# Patient Record
Sex: Female | Born: 1960
Health system: Southern US, Community
[De-identification: ages and names within clinical notes are randomized; demographics above are authoritative.]

## PROBLEM LIST (undated history)

## (undated) DIAGNOSIS — M069 Rheumatoid arthritis, unspecified: Secondary | ICD-10-CM

## (undated) DIAGNOSIS — F419 Anxiety disorder, unspecified: Secondary | ICD-10-CM

## (undated) DIAGNOSIS — J449 Chronic obstructive pulmonary disease, unspecified: Secondary | ICD-10-CM

## (undated) DIAGNOSIS — F329 Major depressive disorder, single episode, unspecified: Secondary | ICD-10-CM

## (undated) DIAGNOSIS — E1140 Type 2 diabetes mellitus with diabetic neuropathy, unspecified: Secondary | ICD-10-CM

## (undated) DIAGNOSIS — F32A Depression, unspecified: Secondary | ICD-10-CM

## (undated) DIAGNOSIS — M797 Fibromyalgia: Secondary | ICD-10-CM

## (undated) DIAGNOSIS — M1990 Unspecified osteoarthritis, unspecified site: Secondary | ICD-10-CM

## (undated) DIAGNOSIS — F1920 Other psychoactive substance dependence, uncomplicated: Secondary | ICD-10-CM

## (undated) DIAGNOSIS — R112 Nausea with vomiting, unspecified: Secondary | ICD-10-CM

## (undated) DIAGNOSIS — K08109 Complete loss of teeth, unspecified cause, unspecified class: Secondary | ICD-10-CM

## (undated) DIAGNOSIS — K5790 Diverticulosis of intestine, part unspecified, without perforation or abscess without bleeding: Secondary | ICD-10-CM

## (undated) DIAGNOSIS — Z973 Presence of spectacles and contact lenses: Secondary | ICD-10-CM

## (undated) DIAGNOSIS — Z9889 Other specified postprocedural states: Secondary | ICD-10-CM

## (undated) DIAGNOSIS — Z972 Presence of dental prosthetic device (complete) (partial): Secondary | ICD-10-CM

## (undated) DIAGNOSIS — K648 Other hemorrhoids: Secondary | ICD-10-CM

## (undated) DIAGNOSIS — K219 Gastro-esophageal reflux disease without esophagitis: Secondary | ICD-10-CM

## (undated) DIAGNOSIS — E785 Hyperlipidemia, unspecified: Secondary | ICD-10-CM

## (undated) HISTORY — DX: Gastro-esophageal reflux disease without esophagitis: K21.9

## (undated) HISTORY — DX: Unspecified osteoarthritis, unspecified site: M19.90

## (undated) HISTORY — DX: Fibromyalgia: M79.7

## (undated) HISTORY — DX: Major depressive disorder, single episode, unspecified: F32.9

## (undated) HISTORY — PX: TONSILLECTOMY: SUR1361

## (undated) HISTORY — DX: Depression, unspecified: F32.A

## (undated) HISTORY — DX: Type 2 diabetes mellitus with diabetic neuropathy, unspecified: E11.40

## (undated) HISTORY — PX: KNEE ARTHROSCOPY: SUR90

## (undated) HISTORY — PX: APPENDECTOMY: SHX54

## (undated) HISTORY — DX: Rheumatoid arthritis, unspecified: M06.9

## (undated) HISTORY — DX: Hyperlipidemia, unspecified: E78.5

## (undated) HISTORY — DX: Diverticulosis of intestine, part unspecified, without perforation or abscess without bleeding: K57.90

## (undated) HISTORY — PX: HERNIA REPAIR: SHX51

## (undated) HISTORY — DX: Other hemorrhoids: K64.8

## (undated) HISTORY — PX: KNEE ARTHROSCOPY: SHX127

## (undated) HISTORY — DX: Other psychoactive substance dependence, uncomplicated: F19.20

## (undated) HISTORY — DX: Anxiety disorder, unspecified: F41.9

---

## 1999-07-02 HISTORY — PX: OTHER SURGICAL HISTORY: SHX169

## 2000-07-01 HISTORY — PX: TUBAL LIGATION: SHX77

## 2007-07-02 HISTORY — PX: CATARACT EXTRACTION: SUR2

## 2008-01-30 HISTORY — PX: EYE SURGERY: SHX253

## 2009-07-01 HISTORY — PX: OTHER SURGICAL HISTORY: SHX169

## 2009-10-18 ENCOUNTER — Emergency Department (HOSPITAL_BASED_OUTPATIENT_CLINIC_OR_DEPARTMENT_OTHER): Admission: EM | Admit: 2009-10-18 | Discharge: 2009-10-18 | Payer: Self-pay | Admitting: Emergency Medicine

## 2009-10-23 ENCOUNTER — Emergency Department (HOSPITAL_COMMUNITY): Admission: EM | Admit: 2009-10-23 | Discharge: 2009-10-24 | Payer: Self-pay | Admitting: Emergency Medicine

## 2010-09-18 LAB — BASIC METABOLIC PANEL
CO2: 22 mEq/L (ref 19–32)
Calcium: 9.2 mg/dL (ref 8.4–10.5)
GFR calc Af Amer: >60 mL/min (ref >60)
GFR calc non Af Amer: >60 mL/min (ref >60)
Glucose, Bld: 289 mg/dL — ABNORMAL HIGH (ref 70–99)

## 2010-09-18 LAB — D-DIMER, QUANTITATIVE: D-Dimer, Quant: <0.22 ug/mL-FEU (ref 0.00–0.48)

## 2010-12-12 ENCOUNTER — Encounter: Payer: Medicaid Other | Attending: Family Medicine

## 2010-12-12 DIAGNOSIS — E119 Type 2 diabetes mellitus without complications: Secondary | ICD-10-CM | POA: Insufficient documentation

## 2010-12-12 DIAGNOSIS — Z713 Dietary counseling and surveillance: Secondary | ICD-10-CM | POA: Insufficient documentation

## 2011-04-12 ENCOUNTER — Other Ambulatory Visit (HOSPITAL_COMMUNITY): Payer: Self-pay | Admitting: Family Medicine

## 2011-04-12 DIAGNOSIS — Z1231 Encounter for screening mammogram for malignant neoplasm of breast: Secondary | ICD-10-CM

## 2011-04-16 ENCOUNTER — Emergency Department (HOSPITAL_COMMUNITY): Payer: Medicare Other

## 2011-04-16 ENCOUNTER — Emergency Department (HOSPITAL_COMMUNITY)
Admission: EM | Admit: 2011-04-16 | Discharge: 2011-04-16 | Disposition: A | Discharge status: Home / Self Care | Payer: Medicare Other | Attending: Emergency Medicine | Admitting: Emergency Medicine

## 2011-04-16 DIAGNOSIS — Z79899 Other long term (current) drug therapy: Secondary | ICD-10-CM | POA: Insufficient documentation

## 2011-04-16 DIAGNOSIS — R1032 Left lower quadrant pain: Secondary | ICD-10-CM | POA: Insufficient documentation

## 2011-04-16 DIAGNOSIS — E119 Type 2 diabetes mellitus without complications: Secondary | ICD-10-CM | POA: Insufficient documentation

## 2011-04-16 DIAGNOSIS — M129 Arthropathy, unspecified: Secondary | ICD-10-CM | POA: Insufficient documentation

## 2011-04-16 DIAGNOSIS — K625 Hemorrhage of anus and rectum: Secondary | ICD-10-CM | POA: Insufficient documentation

## 2011-04-16 DIAGNOSIS — K5730 Diverticulosis of large intestine without perforation or abscess without bleeding: Secondary | ICD-10-CM | POA: Insufficient documentation

## 2011-04-16 DIAGNOSIS — K5900 Constipation, unspecified: Secondary | ICD-10-CM | POA: Insufficient documentation

## 2011-04-16 DIAGNOSIS — Z794 Long term (current) use of insulin: Secondary | ICD-10-CM | POA: Insufficient documentation

## 2011-04-16 DIAGNOSIS — IMO0001 Reserved for inherently not codable concepts without codable children: Secondary | ICD-10-CM | POA: Insufficient documentation

## 2011-04-16 DIAGNOSIS — G589 Mononeuropathy, unspecified: Secondary | ICD-10-CM | POA: Insufficient documentation

## 2011-04-16 LAB — BASIC METABOLIC PANEL
BUN: 11 mg/dL (ref 6–23)
Calcium: 9.9 mg/dL (ref 8.4–10.5)
Glucose, Bld: 172 mg/dL — ABNORMAL HIGH (ref 70–99)

## 2011-04-16 LAB — URINALYSIS, ROUTINE W REFLEX MICROSCOPIC
Bilirubin Urine: NEGATIVE
Glucose, UA: 100 mg/dL — AB
Specific Gravity, Urine: 1.025 (ref 1.005–1.030)
Urobilinogen, UA: 0.2 mg/dL (ref 0.0–1.0)
pH: 5 (ref 5.0–8.0)

## 2011-04-16 LAB — DIFFERENTIAL
Basophils Absolute: 0 10*3/uL (ref 0.0–0.1)
Basophils Relative: 1 % (ref 0–1)
Monocytes Relative: 5 % (ref 3–12)

## 2011-04-16 LAB — CBC
HCT: 41.7 % (ref 36.0–46.0)
Hemoglobin: 14.1 g/dL (ref 12.0–15.0)
MCH: 29.8 pg (ref 26.0–34.0)
MCV: 88.2 fL (ref 78.0–100.0)
Platelets: 224 10*3/uL (ref 150–400)
RDW: 13.3 % (ref 11.5–15.5)
WBC: 8.7 10*3/uL (ref 4.0–10.5)

## 2011-04-16 LAB — OCCULT BLOOD, POC DEVICE: Fecal Occult Bld: NEGATIVE

## 2011-04-16 LAB — LIPASE, BLOOD: Lipase: 27 U/L (ref 11–59)

## 2011-04-16 MED ORDER — IOHEXOL 300 MG/ML  SOLN
80.0000 mL | Freq: Once | INTRAMUSCULAR | Status: AC | PRN
  Administered 2011-04-16: 80 mL via INTRAVENOUS

## 2011-04-18 ENCOUNTER — Encounter: Payer: Self-pay | Admitting: Internal Medicine

## 2011-04-24 ENCOUNTER — Ambulatory Visit (AMBULATORY_SURGERY_CENTER): Payer: Medicare Other | Admitting: *Deleted

## 2011-04-24 VITALS — Ht <= 58 in | Wt 105.1 lb

## 2011-04-24 DIAGNOSIS — Z1211 Encounter for screening for malignant neoplasm of colon: Secondary | ICD-10-CM

## 2011-04-24 MED ORDER — MOVIPREP 100 G PO SOLR: ORAL | Status: DC

## 2011-04-29 ENCOUNTER — Ambulatory Visit (HOSPITAL_COMMUNITY)
Admission: RE | Admit: 2011-04-29 | Discharge: 2011-04-29 | Disposition: A | Discharge status: Home / Self Care | Payer: Medicare Other | Source: Ambulatory Visit | Attending: Family Medicine | Admitting: Family Medicine

## 2011-04-29 DIAGNOSIS — Z1231 Encounter for screening mammogram for malignant neoplasm of breast: Secondary | ICD-10-CM | POA: Insufficient documentation

## 2011-05-03 ENCOUNTER — Ambulatory Visit (AMBULATORY_SURGERY_CENTER): Payer: Medicare Other | Admitting: Internal Medicine

## 2011-05-03 ENCOUNTER — Encounter: Payer: Self-pay | Admitting: Internal Medicine

## 2011-05-03 VITALS — BP 120/75 | HR 87 | Temp 97.6°F | Resp 20 | Ht <= 58 in | Wt 105.0 lb

## 2011-05-03 DIAGNOSIS — D126 Benign neoplasm of colon, unspecified: Secondary | ICD-10-CM

## 2011-05-03 DIAGNOSIS — Z1211 Encounter for screening for malignant neoplasm of colon: Secondary | ICD-10-CM

## 2011-05-03 LAB — GLUCOSE, CAPILLARY: Glucose-Capillary: 132 mg/dL — ABNORMAL HIGH (ref 70–99)

## 2011-05-03 MED ORDER — SODIUM CHLORIDE 0.9 % IV SOLN: 500.0000 mL | INTRAVENOUS | Status: DC

## 2011-05-03 MED ORDER — DICYCLOMINE HCL 10 MG PO CAPS: 10.0000 mg | ORAL_CAPSULE | Freq: Four times a day (QID) | ORAL | Status: DC

## 2011-05-03 MED ORDER — DICYCLOMINE HCL 10 MG PO CAPS: 10.0000 mg | ORAL_CAPSULE | Freq: Three times a day (TID) | ORAL | Status: DC

## 2011-05-06 ENCOUNTER — Telehealth: Payer: Self-pay

## 2011-05-06 NOTE — Telephone Encounter (Signed)
No ID on answering machine. 

## 2011-05-07 ENCOUNTER — Encounter: Payer: Self-pay | Admitting: Internal Medicine

## 2011-06-30 ENCOUNTER — Encounter (HOSPITAL_COMMUNITY): Payer: Self-pay | Admitting: Emergency Medicine

## 2011-06-30 ENCOUNTER — Emergency Department (HOSPITAL_COMMUNITY)
Admission: EM | Admit: 2011-06-30 | Discharge: 2011-06-30 | Disposition: A | Discharge status: Home / Self Care | Payer: Medicare Other | Attending: Emergency Medicine | Admitting: Emergency Medicine

## 2011-06-30 ENCOUNTER — Emergency Department (HOSPITAL_COMMUNITY): Payer: Medicare Other

## 2011-06-30 ENCOUNTER — Other Ambulatory Visit: Payer: Self-pay

## 2011-06-30 DIAGNOSIS — R071 Chest pain on breathing: Secondary | ICD-10-CM | POA: Insufficient documentation

## 2011-06-30 DIAGNOSIS — J209 Acute bronchitis, unspecified: Secondary | ICD-10-CM | POA: Insufficient documentation

## 2011-06-30 DIAGNOSIS — Z794 Long term (current) use of insulin: Secondary | ICD-10-CM | POA: Insufficient documentation

## 2011-06-30 DIAGNOSIS — R059 Cough, unspecified: Secondary | ICD-10-CM | POA: Insufficient documentation

## 2011-06-30 DIAGNOSIS — Z79899 Other long term (current) drug therapy: Secondary | ICD-10-CM | POA: Insufficient documentation

## 2011-06-30 DIAGNOSIS — R110 Nausea: Secondary | ICD-10-CM | POA: Insufficient documentation

## 2011-06-30 DIAGNOSIS — F17200 Nicotine dependence, unspecified, uncomplicated: Secondary | ICD-10-CM | POA: Insufficient documentation

## 2011-06-30 DIAGNOSIS — R05 Cough: Secondary | ICD-10-CM | POA: Insufficient documentation

## 2011-06-30 DIAGNOSIS — R5381 Other malaise: Secondary | ICD-10-CM | POA: Insufficient documentation

## 2011-06-30 DIAGNOSIS — Z9889 Other specified postprocedural states: Secondary | ICD-10-CM | POA: Insufficient documentation

## 2011-06-30 LAB — CBC
HCT: 41 % (ref 36.0–46.0)
Hemoglobin: 14.5 g/dL (ref 12.0–15.0)
RBC: 4.73 MIL/uL (ref 3.87–5.11)
WBC: 8.2 10*3/uL (ref 4.0–10.5)

## 2011-06-30 LAB — COMPREHENSIVE METABOLIC PANEL
ALT: 15 U/L (ref 0–35)
Alkaline Phosphatase: 80 U/L (ref 39–117)
BUN: 15 mg/dL (ref 6–23)
CO2: 25 mEq/L (ref 19–32)
Chloride: 99 mEq/L (ref 96–112)
GFR calc Af Amer: >90 mL/min (ref >90)
GFR calc non Af Amer: >90 mL/min (ref >90)
Glucose, Bld: 254 mg/dL — ABNORMAL HIGH (ref 70–99)
Potassium: 3.8 mEq/L (ref 3.5–5.1)
Sodium: 135 mEq/L (ref 135–145)
Total Bilirubin: 0.2 mg/dL — ABNORMAL LOW (ref 0.3–1.2)
Total Protein: 7 g/dL (ref 6.0–8.3)

## 2011-06-30 MED ORDER — HYDROCOD POLST-CHLORPHEN POLST 10-8 MG/5ML PO LQCR: 5.0000 mL | Freq: Two times a day (BID) | ORAL | Status: DC | PRN

## 2011-06-30 MED ORDER — AZITHROMYCIN 250 MG PO TABS: ORAL_TABLET | ORAL | Status: DC

## 2011-06-30 MED ORDER — HYDROCOD POLST-CHLORPHEN POLST 10-8 MG/5ML PO LQCR
5.0000 mL | Freq: Once | ORAL | Status: AC
  Administered 2011-06-30: 5 mL via ORAL
  Filled 2011-06-30: qty 5

## 2011-06-30 MED ORDER — AZITHROMYCIN 250 MG PO TABS
500.0000 mg | ORAL_TABLET | Freq: Once | ORAL | Status: AC
  Administered 2011-06-30: 500 mg via ORAL
  Filled 2011-06-30: qty 2

## 2011-06-30 NOTE — ED Notes (Signed)
Pt c/o sob and cough since this morning. Pt c/o falling Friday and now has right knee pain. Pt resting quietly in bed. No distress noted.

## 2011-06-30 NOTE — ED Provider Notes (Cosign Needed)
History     CSN: [id number]  Arrival date & time 06/30/11  2006   First MD Initiated Contact with Patient 06/30/11 2117      Chief Complaint  Patient presents with  . Chest Pain    (Consider location/radiation/quality/duration/timing/severity/associated sxs/prior treatment) Patient is a 50 y.o. female presenting with cough.  Cough This is a new problem. The current episode started more than 2 days ago. Episode frequency: Pt has had a deep nonproductive cough with associated left chest pleuritic pain for several days. The problem has been gradually worsening. The cough is non-productive. There has been no fever. Associated symptoms include chest pain. Pertinent negatives include no chills and no sweats. She has tried nothing for the symptoms. Past medical history comments: She is a smoker..    Past Medical History  Diagnosis Date  . Drug addiction clean for 18 months    cocaine  . Diabetes mellitus     Past Surgical History  Procedure Date  . Cataract extraction 2009    bilateral  . Appendectomy   . Cesarean section   . Hernia repair     incisional  . Rotatator cuff repair 2001  . Dental surgery 2011    all teeth removed  . Tubal ligation 2002    Family History  Problem Relation Age of Onset  . Breast cancer Mother   . Breast cancer Maternal Aunt   . Colon cancer Maternal Grandfather   . Diabetes Paternal Grandmother     History  Substance Use Topics  . Smoking status: Current Everyday Smoker -- 0.5 packs/day    Types: Cigarettes  . Smokeless tobacco: Never Used  . Alcohol Use: No    OB History    Grav Para Term Preterm Abortions TAB SAB Ect Mult Living                  Review of Systems  Constitutional: Negative for chills.  HENT: Negative.   Respiratory: Positive for cough.   Cardiovascular: Positive for chest pain.  Gastrointestinal: Negative.   Genitourinary: Negative.   Musculoskeletal: Negative.   Skin: Negative.   Neurological: Negative.    Psychiatric/Behavioral: Negative.     Allergies  Penicillins; Sulfa antibiotics; and Codeine  Home Medications   Current Outpatient Rx  Name Route Sig Dispense Refill  . GABAPENTIN 300 MG PO CAPS Oral Take 300 mg by mouth See admin instructions. One capsule in the morning and two capsules at night     . LANTUS SOLOSTAR 100 UNIT/ML [place] SOLN Oral Take 60 Units by mouth Every morning.    Marland Kitchen METFORMIN HCL 500 MG PO TABS Oral Take 500 mg by mouth Twice daily.    . TRAZODONE HCL 100 MG PO TABS Oral Take 100 mg by mouth at bedtime as needed. For sleep    . DICYCLOMINE HCL 10 MG PO CAPS Oral Take 1 capsule (10 mg total) by mouth 4 (four) times daily. 120 capsule 0    BP 119/79  Pulse 79  Temp(Src) 98.1 F (36.7 C) (Oral)  Resp 19  SpO2 99%  Physical Exam  Nursing note and vitals reviewed. Constitutional: She is oriented to person, place, and time.       Middle aged woman, appears older than stated age, with dry cough.  HENT:  Head: Normocephalic and atraumatic.  Right Ear: External ear normal.  Left Ear: External ear normal.  Mouth/Throat: Oropharynx is clear and moist.  Eyes: Conjunctivae and EOM are normal. Pupils are equal, round,  and reactive to light. No scleral icterus.  Neck: Normal range of motion. Neck supple.  Cardiovascular: Normal rate, regular rhythm and normal heart sounds.   Pulmonary/Chest: Effort normal and breath sounds normal.  Abdominal: Soft. Bowel sounds are normal.  Musculoskeletal: Normal range of motion.  Lymphadenopathy:    She has no cervical adenopathy.  Neurological: She is alert and oriented to person, place, and time.       No sensory or motor deficits.  Skin: Skin is warm and dry.  Psychiatric: Her behavior is normal.    ED Course  Procedures (including critical care time)  Labs Reviewed  COMPREHENSIVE METABOLIC PANEL - Abnormal; Notable for the following:    Glucose, Bld 254 (*)    Total Bilirubin 0.2 (*)    All other components within  normal limits  CBC   Dg Chest 2 View  06/30/2011  *RADIOLOGY REPORT*  Clinical Data: Chest pain.  Cough, nausea.  Weakness.  CHEST - 2 VIEW  Comparison: 10/23/2009  Findings: Cardiomediastinal silhouette is within normal limits. There is minimal left lower lobe atelectasis.  No focal consolidations or pleural effusions are identified.  No evidence for pulmonary edema. Visualized osseous structures have a normal appearance. Prior left shoulder rotator cuff repair.  IMPRESSION: Minimal left lower lobe atelectasis.  Original Report Authenticated By: Patterson Hammersmith, M.D.   8:35 P.M.   Date: 06/30/2011  Rate: 91  Rhythm: normal sinus rhythm  QRS Axis: normal  Intervals: normal  ST/T Wave abnormalities: normal  Conduction Disutrbances:none  Narrative Interpretation: Normal EKG  Old EKG Reviewed: none available    Carleene Cooper III, MD 06/30/11 2036   1. Acute bronchitis      Pt's only lab abnormality was elevated glucose.  Rx for bronchitis with Z-pak and tussionex.  Advised to stop smoking.  F/U at [facility].       Carleene Cooper III, MD 07/01/11 1340

## 2011-06-30 NOTE — ED Notes (Signed)
Pt also reports feeling lightheaded on Friday night and fell.  C/o R knee pain from fall.

## 2011-06-30 NOTE — ED Notes (Signed)
Pt states she woke up this morning with "heavy chest pain," non-productive cough, and sob.  Denies nausea and vomiting.  States pain radiates from center of chest around L side of chest and into back.

## 2011-06-30 NOTE — ED Provider Notes (Deleted)
8:35 PM  Date: 06/30/2011  Rate: 91  Rhythm: normal sinus rhythm  QRS Axis: normal  Intervals: normal  ST/T Wave abnormalities: normal  Conduction Disutrbances:none  Narrative Interpretation: Normal EKG  Old EKG Reviewed: none available    Carleene Cooper III, MD 06/30/11 2036

## 2011-07-01 ENCOUNTER — Encounter (HOSPITAL_COMMUNITY): Payer: Self-pay | Admitting: Emergency Medicine

## 2011-07-04 DIAGNOSIS — J40 Bronchitis, not specified as acute or chronic: Secondary | ICD-10-CM | POA: Diagnosis not present

## 2011-08-13 DIAGNOSIS — E1149 Type 2 diabetes mellitus with other diabetic neurological complication: Secondary | ICD-10-CM | POA: Diagnosis not present

## 2011-08-13 DIAGNOSIS — Z23 Encounter for immunization: Secondary | ICD-10-CM | POA: Diagnosis not present

## 2011-08-13 DIAGNOSIS — Z124 Encounter for screening for malignant neoplasm of cervix: Secondary | ICD-10-CM | POA: Diagnosis not present

## 2011-08-13 DIAGNOSIS — Z0000 Encounter for general adult medical examination without abnormal findings: Secondary | ICD-10-CM | POA: Diagnosis not present

## 2011-08-13 DIAGNOSIS — Z01419 Encounter for gynecological examination (general) (routine) without abnormal findings: Secondary | ICD-10-CM | POA: Diagnosis not present

## 2011-08-13 DIAGNOSIS — E119 Type 2 diabetes mellitus without complications: Secondary | ICD-10-CM | POA: Diagnosis not present

## 2011-08-13 DIAGNOSIS — N949 Unspecified condition associated with female genital organs and menstrual cycle: Secondary | ICD-10-CM | POA: Diagnosis not present

## 2011-08-13 DIAGNOSIS — E785 Hyperlipidemia, unspecified: Secondary | ICD-10-CM | POA: Diagnosis not present

## 2011-08-20 ENCOUNTER — Other Ambulatory Visit: Payer: Self-pay | Admitting: Family Medicine

## 2011-08-20 ENCOUNTER — Telehealth: Payer: Self-pay | Admitting: Internal Medicine

## 2011-08-20 ENCOUNTER — Encounter: Payer: Self-pay | Admitting: *Deleted

## 2011-08-20 DIAGNOSIS — R102 Pelvic and perineal pain: Secondary | ICD-10-CM

## 2011-08-20 NOTE — Telephone Encounter (Signed)
Scheduled patient OV with Dr. Juanda Chance on 08/27/11 at 10:45 AM. Elita Quick at Dr Creta Levin office notified and will fax recent office notes.

## 2011-08-22 ENCOUNTER — Other Ambulatory Visit: Payer: Medicare Other

## 2011-08-27 ENCOUNTER — Ambulatory Visit (INDEPENDENT_AMBULATORY_CARE_PROVIDER_SITE_OTHER): Payer: Medicare Other | Admitting: Internal Medicine

## 2011-08-27 ENCOUNTER — Other Ambulatory Visit (INDEPENDENT_AMBULATORY_CARE_PROVIDER_SITE_OTHER): Payer: Medicare Other

## 2011-08-27 ENCOUNTER — Encounter: Payer: Self-pay | Admitting: Internal Medicine

## 2011-08-27 ENCOUNTER — Ambulatory Visit (HOSPITAL_COMMUNITY)
Admission: RE | Admit: 2011-08-27 | Discharge: 2011-08-27 | Disposition: A | Discharge status: Home / Self Care | Payer: Medicare Other | Source: Ambulatory Visit | Attending: Internal Medicine | Admitting: Internal Medicine

## 2011-08-27 VITALS — BP 100/74 | HR 96 | Temp 97.6°F | Ht 59.0 in | Wt 108.1 lb

## 2011-08-27 DIAGNOSIS — K5732 Diverticulitis of large intestine without perforation or abscess without bleeding: Secondary | ICD-10-CM | POA: Diagnosis not present

## 2011-08-27 DIAGNOSIS — K5730 Diverticulosis of large intestine without perforation or abscess without bleeding: Secondary | ICD-10-CM | POA: Diagnosis not present

## 2011-08-27 DIAGNOSIS — R1032 Left lower quadrant pain: Secondary | ICD-10-CM | POA: Diagnosis not present

## 2011-08-27 DIAGNOSIS — R112 Nausea with vomiting, unspecified: Secondary | ICD-10-CM | POA: Diagnosis not present

## 2011-08-27 DIAGNOSIS — R109 Unspecified abdominal pain: Secondary | ICD-10-CM | POA: Diagnosis not present

## 2011-08-27 DIAGNOSIS — R195 Other fecal abnormalities: Secondary | ICD-10-CM | POA: Diagnosis not present

## 2011-08-27 DIAGNOSIS — Z9089 Acquired absence of other organs: Secondary | ICD-10-CM | POA: Insufficient documentation

## 2011-08-27 LAB — CBC WITH DIFFERENTIAL/PLATELET
Basophils Absolute: 0.1 10*3/uL (ref 0.0–0.1)
HCT: 44.8 % (ref 36.0–46.0)
Lymphs Abs: 3.8 10*3/uL (ref 0.7–4.0)
Monocytes Absolute: 0.5 10*3/uL (ref 0.1–1.0)
Monocytes Relative: 4.1 % (ref 3.0–12.0)
Neutrophils Relative %: 64.6 % (ref 43.0–77.0)
Platelets: 238 10*3/uL (ref 150.0–400.0)
RDW: 14.9 % — ABNORMAL HIGH (ref 11.5–14.6)
WBC: 12.8 10*3/uL — ABNORMAL HIGH (ref 4.5–10.5)

## 2011-08-27 MED ORDER — IOHEXOL 300 MG/ML  SOLN
100.0000 mL | Freq: Once | INTRAMUSCULAR | Status: AC | PRN
  Administered 2011-08-27: 100 mL via INTRAVENOUS

## 2011-08-27 MED ORDER — TRAMADOL HCL 50 MG PO TABS
ORAL_TABLET | ORAL | Status: DC
Start: 2011-08-27 — End: 2012-03-01

## 2011-08-27 MED ORDER — CIPROFLOXACIN HCL 500 MG PO TABS: 500.0000 mg | ORAL_TABLET | Freq: Two times a day (BID) | ORAL | Status: AC

## 2011-08-27 MED ORDER — METRONIDAZOLE 250 MG PO TABS: 250.0000 mg | ORAL_TABLET | Freq: Three times a day (TID) | ORAL | Status: AC

## 2011-08-27 NOTE — Progress Notes (Signed)
[name] Aug 18, 1960 MRN [id number]    History of Present Illness:  This is a 51 year old white female with 4 days of lower abdominal pain which has become worse last night and this morning. She has difficulty walking and changing position. She took a bus to her appointment today.  She has not been able to eat and she has been having diarrhea. She denies rectal bleeding. She had fever 2 days ago. She has a known moderately severe diverticulosis which was seen on colonoscopy in November 2012. She had a small polyp in the rectum which showed  to be at rectal prolapse mucosa. She had Hemoccult-positive stool earlier this month. CT scan of the abdomen last October showed diverticulosis of the sigmoid colon but no diverticulitis.   Past Medical History  Diagnosis Date  . Drug addiction clean for 18 months    cocaine  . Diabetes mellitus   . Diverticulosis   . Internal hemorrhoids   . Depression   . Diabetic neuropathy   . GERD (gastroesophageal reflux disease)   . Fibromyalgia   . Hyperlipidemia   . Rheumatoid arthritis   . Anxiety   . Arthritis    Past Surgical History  Procedure Date  . Cataract extraction 2009    bilateral  . Appendectomy   . Cesarean section   . Hernia repair     incisional  . Rotatator cuff repair 2001    left  . Dental surgery 2011    all teeth removed  . Tubal ligation 2002  . Tonsillectomy     reports that she has been smoking Cigarettes.  She has been smoking about .5 packs per day. She has never used smokeless tobacco. She reports that she does not drink alcohol or use illicit drugs. family history includes Breast cancer in her maternal aunt and mother; Colon cancer in her maternal grandfather; Diabetes in her paternal grandmother; and Heart failure in her maternal grandmother. Allergies  Allergen Reactions  . Penicillins Rash  . Sulfa Antibiotics Hives  . Codeine Nausea Only        Review of Systems: Positive for abdominal pain,  low-grade temperature. Denies rectal bleeding or chest pain  The remainder of the 10 point ROS is negative except as outlined in H&P   Physical Exam: General appearance  Well developed, in mild distress. Eyes- non icteric. HEENT nontraumatic, normocephalic. Mouth no lesions, tongue papillated, no cheilosis. Neck supple without adenopathy, thyroid not enlarged, no carotid bruits, no JVD. Lungs Clear to auscultation bilaterally. Cor normal S1, normal S2, regular rhythm, no murmur,  quiet precordium. Abdomen: Mildly distended. Quiet bowel sounds. Diffusely tender in all quadrants with early rebound and guarding. No palpable mass or ascites. Rectal: Sulfa Hemoccult negative stool Extremities no pedal edema. Skin no lesions. Neurological alert and oriented x 3. Psychological normal mood and affect.  Assessment and Plan:  Problem #1 Pt has  a clinical picture of acute diverticulitis. She has moderately severe diverticulosis on recent colonoscopy.. Her physical  exam which suggests acute inflammation. She may need hospitalization but first, we will obtain a CT scan of the abdomen with attention to the left lower quadrant. She will stay on full liquids and start on Cipro 500 mg twice a day as well as Flagyl 250 mg 3 times a day for 10 days. If the CT scan shows an abscess or perforation, she will need hospitalization. For pain control, we will have her take Tylenol since she is unable to take codeine or Tramadol (  she has a hx of drug dependence). We will check her CBC with differential today.  Problem #2 Hemoccult-positive stool on a prior exam.  I could not reproduce the Hemoccult positive stool today.. She just had a full colonoscopy several months ago so there is no need for a repeat colonoscopy. If she remains Hemoccult positive after her diverticulitis has resolved, we will consider an upper endoscopy.   08/27/2011 Lina Sar

## 2011-08-27 NOTE — Patient Instructions (Addendum)
You have been given a separate informational sheet regarding your tobacco use, the importance of quitting and local resources to help you quit. We have sent the following medications to your pharmacy for you to pick up at your convenience: Cipro, Flagyl Your physician has requested that you go to the basement for the following lab work before leaving today: CBC You have been scheduled for a CT scan of the abdomen and pelvis at [facility]  You are scheduled TODAY at 4:30PM. You should arrive 15 minutes prior to your appointment time for registration. Please follow the written instructions below on the day of your exam:  WARNING: IF YOU ARE ALLERGIC TO IODINE/X-RAY DYE, PLEASE NOTIFY RADIOLOGY IMMEDIATELY AT [phone]! YOU WILL BE GIVEN A 13 HOUR PREMEDICATION PREP.  1) Do not eat or drink anything after 12:30PM (4 hours prior to your test) 2) You have been given 2 bottles of oral contrast to drink. The solution may tastenbetter if refrigerated, but do NOT add ice or any other liquid to this solution. Shake well before drinking.    Drink 1 bottle of contrast @ 2:30PM (2 hours prior to your exam)  Drink 1 bottle of contrast @ 3:30 pm (1 hour prior to your exam)  You may take any medications as prescribed with a small amount of water except for the following: Metformin, Glucophage, Glucovance, Avandamet, Riomet, Fortamet, Actoplus Met, Janumet, Glumetza or Metaglip. The above medications must be held the day of the exam AND 48 hours after the exam.  The purpose of you drinking the oral contrast is to aid in the visualization of your intestinal tract. The contrast solution may cause some diarrhea. Before your exam is started, you will be given a small amount of fluid to drink. Depending on your individual set of symptoms, you may also receive an intravenous injection of x-ray contrast/dye. Plan on being at [facility] for 30 minutes or longer, depending on the type of exam you are having  performed. ________________________________________________________________________  Full Liquid Diet The full liquid diet includes those foods that are liquid or will become liquid at body temperature. This diet is very restrictive. Its use should be limited to a short period of time and only under the advice or supervision of your caregiver or dietitian.  A high-calorie, high-protein supplement should be used to meet your nutritional requirements when the full liquid diet is continued for more than 2 or 3 days. If this diet is to be used for an extended period of time (more than 7 days), a multivitamin should be considered. REASONS FOR USE  As a transition diet between the clear liquid diet and solid foods.   When patients cannot tolerate solid foods.  ADEQUACY The full liquid diet is nutritionally inadequate according to the Recommended Dietary Allowances of the [employer], except in ascorbic acid and calcium. Protein requirements can be met if adequate amounts of dairy products are consumed daily. The full liquid diet can be nutritionally adequate if it is fortified with a nutritional supplement. Your caregiver can give you recommendations on liquids that have nutritional supplements included in them. CHOOSING FOODS Breads and Starches  Allowed: None are allowed except crackers that are pureed (made into a thick, smooth soup) in soup. Cooked, refined corn, oat, rice, rye, and wheat cereals are also allowed.   Avoid: Any others.  Potatoes/Pasta/Rice  Allowed: None except pureed in soup.   Avoid: Any others.  Vegetables  Allowed: Strained tomato or vegetable juice. Vegetables  pureed in soup.   Avoid: Any others.  Fruit  Allowed: Any strained fruit juices and fruit drinks. Include 1 serving of citrus or vitamin C-enriched fruit juice daily.   Avoid: Any others.  Meat and Meat Substitutes  Allowed: Eggs in custard, eggnog mix, eggs used in ice cream or pudding.    Avoid: Any meat, fish, or fowl. All cheese. All other cooked or raw eggs.  Milk  Allowed: Milk and milk-based beverages, including milk shakes and instant breakfast mixes. Smooth yogurt.   Avoid: Any others. Avoid dairy products if not tolerated.  Soups and Combination Foods  Allowed: Broth, strained cream soups. Strained, broth-based soups.   Avoid: Any others.  Desserts and Sweets  Allowed: Custard, flavored gelatin, tapioca, plain ice cream, sherbet, smooth pudding, junket, fruit ices, frozen ice pops, pudding pops. Other frozen bars with cream, frozen fudge pops, chocolate syrup. Sugar, honey, jelly, syrup.   Avoid: Any others.  Fats and Oils  Allowed: Margarine, butter, cream, sour cream, oils.   Avoid: Any others.  Beverages  Allowed: All.   Avoid: None.  Condiments  Allowed: Iodized salt, pepper, spices, flavorings. Cocoa powder.   Avoid: Any others.  SAMPLE MEAL PLAN Breakfast   cup orange juice.   1 cup cooked wheat cereal.   1 cup milk.   1 cup beverage (coffee or tea).   Cream or sugar, if desired.  Midmorning Snack  1 cup pasteurized eggnog (made from powdered eggs mixed with milk, not raw eggs).  Lunch  1 cup cream soup.    cup fruit juice.   1 cup milk.    cup custard.   1 cup beverage (coffee or tea).   Cream or sugar, if desired.  Midafternoon Snack  1 cup milk shake.  Dinner  1 cup cream soup.    cup fruit juice.   1 cup milk.    cup pudding.   1 cup beverage (coffee or tea).   Cream or sugar, if desired.  Evening Snack  1 cup supplement.  To increase calories, add sugar, cream, butter, or margarine if possible. Nutritional supplements will also increase the total calories. The above sample meal plan cannot meet the Recommended Dietary Allowances of the [employer] without appropriate supplementation under the guidance of your caregiver or dietitian. Document Released: 06/17/2005 Document Revised:  02/27/2011 Document Reviewed: 03/20/2007 [facility] Patient Information 2012 [place], [place].   CC: Dr Warrick Parisian   Of note: Per patient, she cannot take Tramadol because she takes Elavil. I have talked to Dr Juanda Chance regarding this and she states that patient may take over the counter Tylenol. Patient advised. I have also spoken to Pharmacist, Maralyn Sago at [facility] and have asked that she discontinue Tramadol Rx. She states that she will do this. 11:33 am 08/27/11 Ronny Bacon, CMA

## 2011-08-28 ENCOUNTER — Ambulatory Visit
Admission: RE | Admit: 2011-08-28 | Discharge: 2011-08-28 | Disposition: A | Discharge status: Home / Self Care | Payer: Medicare Other | Source: Ambulatory Visit | Attending: Family Medicine | Admitting: Family Medicine

## 2011-08-28 DIAGNOSIS — R102 Pelvic and perineal pain: Secondary | ICD-10-CM

## 2011-08-28 DIAGNOSIS — N949 Unspecified condition associated with female genital organs and menstrual cycle: Secondary | ICD-10-CM | POA: Diagnosis not present

## 2011-09-04 ENCOUNTER — Telehealth: Payer: Self-pay | Admitting: Internal Medicine

## 2011-09-04 NOTE — Telephone Encounter (Signed)
Patient saw Dr. Juanda Chance on 08/27/11 for left lower abdominal pain and was given Cipro and Flagyl. She had a CT on 08/27/11- sigmoid diverticulosis without diverticulitis. She has continued on Cipro and Flagyl as per Dr.Brodie but is not any better. States the pain does not hurt as much when in fetal position but hurts when she walks. She has not had a bowel movement in 4 days and does not remember passing gas. She is having nausea. When she eats, her pain is worse. Denies vomiting or fever. Dr. Juanda Chance is off. Dr. Leone Payor- Doc of Day. Please, advise.

## 2011-09-05 NOTE — Telephone Encounter (Signed)
Patient given Dr. Marvell Fuller recommendation.

## 2011-09-05 NOTE — Telephone Encounter (Signed)
If she is still not moving her bowels she should drink 6-8 glasses of MiraLax in 2 hours to see if that starts bowels working

## 2011-11-01 DIAGNOSIS — G909 Disorder of the autonomic nervous system, unspecified: Secondary | ICD-10-CM | POA: Diagnosis not present

## 2011-11-01 DIAGNOSIS — E1149 Type 2 diabetes mellitus with other diabetic neurological complication: Secondary | ICD-10-CM | POA: Diagnosis not present

## 2011-11-01 DIAGNOSIS — Z794 Long term (current) use of insulin: Secondary | ICD-10-CM | POA: Diagnosis not present

## 2011-11-01 DIAGNOSIS — E785 Hyperlipidemia, unspecified: Secondary | ICD-10-CM | POA: Diagnosis not present

## 2011-11-01 DIAGNOSIS — IMO0001 Reserved for inherently not codable concepts without codable children: Secondary | ICD-10-CM | POA: Diagnosis not present

## 2011-11-01 DIAGNOSIS — M2550 Pain in unspecified joint: Secondary | ICD-10-CM | POA: Diagnosis not present

## 2011-12-02 DIAGNOSIS — G5600 Carpal tunnel syndrome, unspecified upper limb: Secondary | ICD-10-CM | POA: Diagnosis not present

## 2011-12-02 DIAGNOSIS — M19049 Primary osteoarthritis, unspecified hand: Secondary | ICD-10-CM | POA: Diagnosis not present

## 2011-12-18 DIAGNOSIS — G5600 Carpal tunnel syndrome, unspecified upper limb: Secondary | ICD-10-CM | POA: Diagnosis not present

## 2012-02-04 DIAGNOSIS — G909 Disorder of the autonomic nervous system, unspecified: Secondary | ICD-10-CM | POA: Diagnosis not present

## 2012-02-04 DIAGNOSIS — M2550 Pain in unspecified joint: Secondary | ICD-10-CM | POA: Diagnosis not present

## 2012-02-04 DIAGNOSIS — E785 Hyperlipidemia, unspecified: Secondary | ICD-10-CM | POA: Diagnosis not present

## 2012-02-04 DIAGNOSIS — F411 Generalized anxiety disorder: Secondary | ICD-10-CM | POA: Diagnosis not present

## 2012-02-04 DIAGNOSIS — F329 Major depressive disorder, single episode, unspecified: Secondary | ICD-10-CM | POA: Diagnosis not present

## 2012-02-04 DIAGNOSIS — R21 Rash and other nonspecific skin eruption: Secondary | ICD-10-CM | POA: Diagnosis not present

## 2012-02-04 DIAGNOSIS — F17200 Nicotine dependence, unspecified, uncomplicated: Secondary | ICD-10-CM | POA: Diagnosis not present

## 2012-02-04 DIAGNOSIS — E1149 Type 2 diabetes mellitus with other diabetic neurological complication: Secondary | ICD-10-CM | POA: Diagnosis not present

## 2012-02-25 ENCOUNTER — Encounter: Payer: Medicare Other | Attending: Family Medicine | Admitting: *Deleted

## 2012-02-25 DIAGNOSIS — Z713 Dietary counseling and surveillance: Secondary | ICD-10-CM | POA: Insufficient documentation

## 2012-02-25 DIAGNOSIS — E119 Type 2 diabetes mellitus without complications: Secondary | ICD-10-CM | POA: Diagnosis not present

## 2012-02-27 ENCOUNTER — Encounter: Payer: Self-pay | Admitting: *Deleted

## 2012-02-27 ENCOUNTER — Ambulatory Visit: Payer: Medicare Other

## 2012-02-27 ENCOUNTER — Encounter: Payer: Medicare Other | Admitting: *Deleted

## 2012-02-27 DIAGNOSIS — J209 Acute bronchitis, unspecified: Secondary | ICD-10-CM | POA: Diagnosis not present

## 2012-02-27 NOTE — Progress Notes (Signed)
  Medical Nutrition Therapy:  Appt start time: 1230 end time:  1330.  Assessment:  Primary concerns today: patient here with her friend, Dennie Bible, who appears very supportive. Patient was referred for diabetes education and she attended Core 1 Class on 02/25/2012, but I have suggested she continue her education with individual appointments rather than in group due to the extensive control issues she is having with her diabetes.  Last A1c = 10.2% on 08/13/2011  MEDICATIONS: see list. Current diabetes medications include: Lantus @ 40 units in AM and Metformin   Progress Towards Goal(s):  In progress.   Nutritional Diagnosis:  NB-1.1 Food and nutrition-related knowledge deficit As related to diabetes management.  As evidenced by A1c of 10.2% in February, 2013 .    Intervention:  Nutrition counseling and diabetes education continued from Core Class 1. Reviewed basic physiology of diabetes, discussed SMBG and rationale of checking BG at alternate times of day, A1c, reviewed Carb Counting and reading food labels. Also discussed action time of Lantus and rationale of taking it at consistent times of day to reduce variability of BG. Plan: Aim for 3 Carb Choices per meal (45 grams) Aim for 0-1 Carb choice per snack if hungry Read food labels for total carbohydrate of foods Take Lantus at consistent time each day (7 PM) to improve variability of BG Continue checking your BG as directed by MD   Handouts given during visit include:  Diabetes Medication List  Monitoring/Evaluation:  Dietary intake, exercise, SMBG at alternate times of day, and body weight prn. Patient to call when ready for next visit.

## 2012-02-27 NOTE — Progress Notes (Signed)
  Patient was seen on 02/25/2012 for the first of a series of three diabetes self-management courses at [facility]. The following learning objectives were met by the patient during this course:   Defines the role of glucose and insulin  Identifies type of diabetes and pathophysiology  Defines the diagnostic criteria for diabetes and prediabetes  States the risk factors for Type 2 Diabetes  States the symptoms of Type 2 Diabetes  Defines Type 2 Diabetes treatment goals  Defines Type 2 Diabetes treatment options  States the rationale for glucose monitoring  Identifies A1C, glucose targets, and testing times  Identifies proper sharps disposal  Defines the purpose of a diabetes food plan  Identifies carbohydrate food groups  Defines effects of carbohydrate foods on glucose levels  Identifies carbohydrate choices/grams/food labels  States benefits of physical activity and effect on glucose  Review of suggested activity guidelines  Handouts given during class include:  Type 2 Diabetes: Basics Book  My Food Plan Book  Food and Activity Log  Follow-Up Plan: Plan to have patient follow up with me individually rather than attend Core Classes 2 and 3 due to the extensive issues she is having with her diabetes.

## 2012-02-27 NOTE — Patient Instructions (Signed)
Goals:  Follow Diabetes Meal Plan as instructed  Eat 3 meals and 2 snacks, every 3-5 hrs  Limit carbohydrate intake to 45 grams carbohydrate/meal  Limit carbohydrate intake to 15 grams carbohydrate/snack  Add lean protein foods to meals/snacks  Monitor glucose levels as instructed by your doctor  Aim for 15-30 mins of physical activity daily  Bring food record and glucose log to your next nutrition visit   

## 2012-02-27 NOTE — Patient Instructions (Signed)
Plan: Aim for 3 Carb Choices per meal (45 grams) Aim for 0-1 Carb choice per snack if hungry Read food labels for total carbohydrate of foods Take Lantus at consistent time each day (7 PM) to improve variability of BG Continue checking your BG as directed by MD

## 2012-03-01 ENCOUNTER — Encounter (HOSPITAL_COMMUNITY): Payer: Self-pay | Admitting: Emergency Medicine

## 2012-03-01 ENCOUNTER — Emergency Department (HOSPITAL_COMMUNITY)
Admission: EM | Admit: 2012-03-01 | Discharge: 2012-03-02 | Disposition: A | Discharge status: Home / Self Care | Payer: Medicare Other | Attending: Emergency Medicine | Admitting: Emergency Medicine

## 2012-03-01 ENCOUNTER — Emergency Department (HOSPITAL_COMMUNITY): Payer: Medicare Other

## 2012-03-01 DIAGNOSIS — A4902 Methicillin resistant Staphylococcus aureus infection, unspecified site: Secondary | ICD-10-CM | POA: Insufficient documentation

## 2012-03-01 DIAGNOSIS — E119 Type 2 diabetes mellitus without complications: Secondary | ICD-10-CM | POA: Diagnosis not present

## 2012-03-01 DIAGNOSIS — Z9089 Acquired absence of other organs: Secondary | ICD-10-CM | POA: Diagnosis not present

## 2012-03-01 DIAGNOSIS — J40 Bronchitis, not specified as acute or chronic: Secondary | ICD-10-CM | POA: Diagnosis not present

## 2012-03-01 DIAGNOSIS — J069 Acute upper respiratory infection, unspecified: Secondary | ICD-10-CM | POA: Diagnosis not present

## 2012-03-01 DIAGNOSIS — Z79899 Other long term (current) drug therapy: Secondary | ICD-10-CM | POA: Insufficient documentation

## 2012-03-01 DIAGNOSIS — F329 Major depressive disorder, single episode, unspecified: Secondary | ICD-10-CM | POA: Diagnosis not present

## 2012-03-01 DIAGNOSIS — M069 Rheumatoid arthritis, unspecified: Secondary | ICD-10-CM | POA: Diagnosis not present

## 2012-03-01 DIAGNOSIS — K219 Gastro-esophageal reflux disease without esophagitis: Secondary | ICD-10-CM | POA: Insufficient documentation

## 2012-03-01 DIAGNOSIS — R42 Dizziness and giddiness: Secondary | ICD-10-CM | POA: Diagnosis not present

## 2012-03-01 DIAGNOSIS — F411 Generalized anxiety disorder: Secondary | ICD-10-CM | POA: Insufficient documentation

## 2012-03-01 DIAGNOSIS — F3289 Other specified depressive episodes: Secondary | ICD-10-CM | POA: Insufficient documentation

## 2012-03-01 DIAGNOSIS — J209 Acute bronchitis, unspecified: Secondary | ICD-10-CM | POA: Diagnosis not present

## 2012-03-01 LAB — CBC
MCH: 29.6 pg (ref 26.0–34.0)
MCHC: 33.7 g/dL (ref 30.0–36.0)
Platelets: 184 10*3/uL (ref 150–400)
RBC: 4.66 MIL/uL (ref 3.87–5.11)

## 2012-03-01 LAB — BASIC METABOLIC PANEL
Calcium: 9.5 mg/dL (ref 8.4–10.5)
GFR calc non Af Amer: >90 mL/min (ref >90)
Potassium: 3.9 mEq/L (ref 3.5–5.1)
Sodium: 133 mEq/L — ABNORMAL LOW (ref 135–145)

## 2012-03-01 LAB — GLUCOSE, CAPILLARY: Glucose-Capillary: 274 mg/dL — ABNORMAL HIGH (ref 70–99)

## 2012-03-01 NOTE — ED Notes (Addendum)
Pt presents to ED today with mulitple c/o. Pt reports SOB, O2-100% RA, dizziness with no syncope, but worse with coughing, sores on body seen by MD,but no dx, chest wall pain with coughing, HA and pressure to the back of head when coughing and a bulge to the L upper abd.  Pt was seen by PCP Aug 6th and tx with dycyclomine for URI and Friday was given Z-pack for same. Pt reports weakness from extended illness, not eating and drinking normally. Pt denies V/D and fever but reports nausea. Cough is not productive "just tight". Pt is a smoker but adds that she has not smoked for two days. Pt now adds that she has only had two BM's in the past three weeks, last BM was Friday.

## 2012-03-02 MED ORDER — HYDROCOD POLST-CHLORPHEN POLST 10-8 MG/5ML PO LQCR: 5.0000 mL | Freq: Two times a day (BID) | ORAL | Status: DC | PRN

## 2012-03-02 MED ORDER — CEPHALEXIN 500 MG PO CAPS: 500.0000 mg | ORAL_CAPSULE | Freq: Four times a day (QID) | ORAL | Status: AC

## 2012-03-02 NOTE — ED Provider Notes (Signed)
History     CSN: [id number]  Arrival date & time 03/01/12  2014   First MD Initiated Contact with Patient 03/02/12 0045      Chief Complaint  Patient presents with  . Shortness of Breath  . Dizziness    (Consider location/radiation/quality/duration/timing/severity/associated sxs/prior treatment) Patient is a 51 y.o. female presenting with shortness of breath. The history is provided by the patient.  Shortness of Breath  Associated symptoms include chest pain, cough and shortness of breath. Pertinent negatives include no fever.   51 year old, female, with a history of COPD, who continues to smoke, presents emergency department complaining of nonproductive cough, and intermittent left-sided chest pain.  She said the chest.  Pain, is only present when she does cough.  She has not had a fever.  She does state, that she's had chills.  She denies nausea, vomiting, or diarrhea.  She complains of shortness of breath, only when she takes a deep breath.  Presently, when she is breathing comfortably.  She is asymptomatic.  She also complains of a rash on her lower extremities.  Past Medical History  Diagnosis Date  . Drug addiction clean for 18 months    cocaine  . Diabetes mellitus   . Diverticulosis   . Internal hemorrhoids   . Depression   . Diabetic neuropathy   . GERD (gastroesophageal reflux disease)   . Fibromyalgia   . Hyperlipidemia   . Rheumatoid arthritis   . Anxiety   . Arthritis     Past Surgical History  Procedure Date  . Cataract extraction 2009    bilateral  . Appendectomy   . Cesarean section   . Hernia repair     incisional  . Rotatator cuff repair 2001    left  . Dental surgery 2011    all teeth removed  . Tubal ligation 2002  . Tonsillectomy   . Knee arthroscopy Right    Family History  Problem Relation Age of Onset  . Breast cancer Mother   . Breast cancer Maternal Aunt   . Colon cancer Maternal Grandfather   . Diabetes Paternal Grandmother   .  Heart failure Maternal Grandmother     CHF    History  Substance Use Topics  . Smoking status: Current Everyday Smoker -- 0.5 packs/day    Types: Cigarettes  . Smokeless tobacco: Never Used  . Alcohol Use: No    OB History    Grav Para Term Preterm Abortions TAB SAB Ect Mult Living                  Review of Systems  Constitutional: Positive for chills. Negative for fever and diaphoresis.  HENT: Negative for congestion.   Eyes: Negative for redness.  Respiratory: Positive for cough and shortness of breath. Negative for chest tightness.   Cardiovascular: Positive for chest pain. Negative for leg swelling.       Chest pain, is present only when she coughs  Gastrointestinal: Negative for nausea, vomiting, abdominal pain and diarrhea.  Musculoskeletal: Negative for back pain.  Skin: Positive for rash.  Neurological: Negative for headaches.  Hematological: Does not bruise/bleed easily.  Psychiatric/Behavioral: Negative for confusion.  All other systems reviewed and are negative.    Allergies  Penicillins; Sulfa antibiotics; Statins; and Codeine  Home Medications   Current Outpatient Rx  Name Route Sig Dispense Refill  . LANTUS SOLOSTAR 100 UNIT/ML [place] SOLN Injection Inject 40-60 Units as directed Every morning.     Marland Kitchen OMEPRAZOLE 20  MG PO CPDR Oral Take 20 mg by mouth daily as needed. For acid reflux    . PROMETHAZINE HCL 25 MG PO TABS Oral Take 25 mg by mouth every 6 (six) hours as needed. For nausea    . TRAMADOL HCL 50 MG PO TABS Oral Take 50-100 mg by mouth every 6 (six) hours as needed.    . TRAZODONE HCL 100 MG PO TABS Oral Take 100 mg by mouth at bedtime as needed. For sleep    . AZITHROMYCIN 500 MG PO TABS Oral Take 500 mg by mouth as directed.      BP 119/81  Pulse 70  Temp 97.6 F (36.4 C) (Oral)  Resp 12  SpO2 100%  Physical Exam  Nursing note and vitals reviewed. Constitutional: She is oriented to person, place, and time. She appears well-developed and  well-nourished. No distress.  HENT:  Head: Normocephalic and atraumatic.  Eyes: Conjunctivae are normal. Pupils are equal, round, and reactive to light.  Neck: Normal range of motion. Neck supple.  Cardiovascular: Normal rate, regular rhythm and intact distal pulses.   No murmur heard. Pulmonary/Chest: Effort normal and breath sounds normal. No respiratory distress. She has no wheezes. She has no rales. She exhibits no tenderness.  Abdominal: Soft. She exhibits no distension.  Musculoskeletal: Normal range of motion. She exhibits no edema and no tenderness.  Neurological: She is alert and oriented to person, place, and time.  Skin: Skin is warm and dry. Rash noted.       Rash consistent with MRSA  Psychiatric: She has a normal mood and affect. Thought content normal.    ED Course  Procedures (including critical care time) nonproductive cough in a smoker, with no fever, hypoxia, or respiratory distress and clear.  Lungs.  We will perform a chest x-ray, to evaluate for possible pneumonia.  Labs Reviewed  GLUCOSE, CAPILLARY - Abnormal; Notable for the following:    Glucose-Capillary 274 (*)     All other components within normal limits  BASIC METABOLIC PANEL - Abnormal; Notable for the following:    Sodium 133 (*)     Glucose, Bld 246 (*)     All other components within normal limits  CBC   Dg Chest 2 View  03/01/2012  *RADIOLOGY REPORT*  Clinical Data: Shortness of breath.  On antibiotics for upper respiratory infection.  CHEST - 2 VIEW  Comparison: 06/30/2011 and 10/23/2009.  Findings: The heart size and mediastinal contours are stable. There is chronic left lower lobe scarring or atelectasis which is similar to prior studies.  No airspace disease, edema or significant pleural effusion is identified.  Osseous structures are unchanged.  IMPRESSION: Stable chronic left lower lobe atelectasis or scarring.  No acute cardiopulmonary process.   Original Report Authenticated By: Gerrianne Scale, M.D.      No diagnosis found.  I personally reviewed.  The chest x-ray.  There is no evidence of pneumonia.  MDM  Bronchitis.  No pneumonia, hypoxia, respiratory distress. MRSA        Cheri Guppy, MD 03/02/12 [phone]

## 2012-03-27 DIAGNOSIS — Z23 Encounter for immunization: Secondary | ICD-10-CM | POA: Diagnosis not present

## 2012-04-16 DIAGNOSIS — M2550 Pain in unspecified joint: Secondary | ICD-10-CM | POA: Diagnosis not present

## 2012-04-16 DIAGNOSIS — R5381 Other malaise: Secondary | ICD-10-CM | POA: Diagnosis not present

## 2012-04-16 DIAGNOSIS — Z79899 Other long term (current) drug therapy: Secondary | ICD-10-CM | POA: Diagnosis not present

## 2012-04-16 DIAGNOSIS — M25549 Pain in joints of unspecified hand: Secondary | ICD-10-CM | POA: Diagnosis not present

## 2012-04-16 DIAGNOSIS — M25569 Pain in unspecified knee: Secondary | ICD-10-CM | POA: Diagnosis not present

## 2012-04-16 DIAGNOSIS — M25529 Pain in unspecified elbow: Secondary | ICD-10-CM | POA: Diagnosis not present

## 2012-04-16 DIAGNOSIS — IMO0001 Reserved for inherently not codable concepts without codable children: Secondary | ICD-10-CM | POA: Diagnosis not present

## 2012-04-21 DIAGNOSIS — T1490XA Injury, unspecified, initial encounter: Secondary | ICD-10-CM | POA: Diagnosis not present

## 2012-04-23 ENCOUNTER — Ambulatory Visit
Admission: RE | Admit: 2012-04-23 | Discharge: 2012-04-23 | Disposition: A | Discharge status: Home / Self Care | Payer: Medicare Other | Source: Ambulatory Visit | Attending: Rheumatology | Admitting: Rheumatology

## 2012-04-23 ENCOUNTER — Other Ambulatory Visit: Payer: Self-pay | Admitting: Rheumatology

## 2012-04-23 DIAGNOSIS — R6889 Other general symptoms and signs: Secondary | ICD-10-CM

## 2012-04-23 DIAGNOSIS — R0602 Shortness of breath: Secondary | ICD-10-CM | POA: Diagnosis not present

## 2012-04-23 DIAGNOSIS — Z87891 Personal history of nicotine dependence: Secondary | ICD-10-CM | POA: Diagnosis not present

## 2012-04-30 DIAGNOSIS — J209 Acute bronchitis, unspecified: Secondary | ICD-10-CM | POA: Diagnosis not present

## 2012-04-30 DIAGNOSIS — M069 Rheumatoid arthritis, unspecified: Secondary | ICD-10-CM | POA: Diagnosis not present

## 2012-05-05 DIAGNOSIS — M25549 Pain in joints of unspecified hand: Secondary | ICD-10-CM | POA: Diagnosis not present

## 2012-05-05 DIAGNOSIS — M25569 Pain in unspecified knee: Secondary | ICD-10-CM | POA: Diagnosis not present

## 2012-05-05 DIAGNOSIS — M25529 Pain in unspecified elbow: Secondary | ICD-10-CM | POA: Diagnosis not present

## 2012-05-05 DIAGNOSIS — IMO0001 Reserved for inherently not codable concepts without codable children: Secondary | ICD-10-CM | POA: Diagnosis not present

## 2012-05-20 ENCOUNTER — Encounter (INDEPENDENT_AMBULATORY_CARE_PROVIDER_SITE_OTHER): Payer: Medicare Other | Admitting: Ophthalmology

## 2012-05-20 DIAGNOSIS — E1165 Type 2 diabetes mellitus with hyperglycemia: Secondary | ICD-10-CM

## 2012-05-20 DIAGNOSIS — E11319 Type 2 diabetes mellitus with unspecified diabetic retinopathy without macular edema: Secondary | ICD-10-CM

## 2012-05-20 DIAGNOSIS — H43819 Vitreous degeneration, unspecified eye: Secondary | ICD-10-CM

## 2012-05-20 DIAGNOSIS — Z09 Encounter for follow-up examination after completed treatment for conditions other than malignant neoplasm: Secondary | ICD-10-CM

## 2012-05-20 DIAGNOSIS — E1139 Type 2 diabetes mellitus with other diabetic ophthalmic complication: Secondary | ICD-10-CM

## 2012-05-22 DIAGNOSIS — Z111 Encounter for screening for respiratory tuberculosis: Secondary | ICD-10-CM | POA: Diagnosis not present

## 2012-05-22 DIAGNOSIS — M25549 Pain in joints of unspecified hand: Secondary | ICD-10-CM | POA: Diagnosis not present

## 2012-05-22 DIAGNOSIS — M659 Synovitis and tenosynovitis, unspecified: Secondary | ICD-10-CM | POA: Diagnosis not present

## 2012-05-22 DIAGNOSIS — IMO0001 Reserved for inherently not codable concepts without codable children: Secondary | ICD-10-CM | POA: Diagnosis not present

## 2012-05-27 DIAGNOSIS — E1149 Type 2 diabetes mellitus with other diabetic neurological complication: Secondary | ICD-10-CM | POA: Diagnosis not present

## 2012-06-01 DIAGNOSIS — E1149 Type 2 diabetes mellitus with other diabetic neurological complication: Secondary | ICD-10-CM | POA: Diagnosis not present

## 2012-06-03 DIAGNOSIS — M79609 Pain in unspecified limb: Secondary | ICD-10-CM | POA: Diagnosis not present

## 2012-06-03 DIAGNOSIS — Z79899 Other long term (current) drug therapy: Secondary | ICD-10-CM | POA: Diagnosis not present

## 2012-06-03 DIAGNOSIS — IMO0001 Reserved for inherently not codable concepts without codable children: Secondary | ICD-10-CM | POA: Diagnosis not present

## 2012-06-03 DIAGNOSIS — G5600 Carpal tunnel syndrome, unspecified upper limb: Secondary | ICD-10-CM | POA: Diagnosis not present

## 2012-06-03 DIAGNOSIS — G894 Chronic pain syndrome: Secondary | ICD-10-CM | POA: Diagnosis not present

## 2012-06-27 DIAGNOSIS — R509 Fever, unspecified: Secondary | ICD-10-CM | POA: Diagnosis not present

## 2012-06-30 DIAGNOSIS — Z79899 Other long term (current) drug therapy: Secondary | ICD-10-CM | POA: Diagnosis not present

## 2012-06-30 DIAGNOSIS — E1149 Type 2 diabetes mellitus with other diabetic neurological complication: Secondary | ICD-10-CM | POA: Diagnosis not present

## 2012-07-08 DIAGNOSIS — Z79899 Other long term (current) drug therapy: Secondary | ICD-10-CM | POA: Diagnosis not present

## 2012-07-08 DIAGNOSIS — IMO0001 Reserved for inherently not codable concepts without codable children: Secondary | ICD-10-CM | POA: Diagnosis not present

## 2012-07-08 DIAGNOSIS — G894 Chronic pain syndrome: Secondary | ICD-10-CM | POA: Diagnosis not present

## 2012-07-15 DIAGNOSIS — Z79899 Other long term (current) drug therapy: Secondary | ICD-10-CM | POA: Diagnosis not present

## 2012-07-24 DIAGNOSIS — M069 Rheumatoid arthritis, unspecified: Secondary | ICD-10-CM | POA: Diagnosis not present

## 2012-07-24 DIAGNOSIS — M19049 Primary osteoarthritis, unspecified hand: Secondary | ICD-10-CM | POA: Diagnosis not present

## 2012-07-24 DIAGNOSIS — Z09 Encounter for follow-up examination after completed treatment for conditions other than malignant neoplasm: Secondary | ICD-10-CM | POA: Diagnosis not present

## 2012-07-24 DIAGNOSIS — M25549 Pain in joints of unspecified hand: Secondary | ICD-10-CM | POA: Diagnosis not present

## 2012-07-31 DIAGNOSIS — E1149 Type 2 diabetes mellitus with other diabetic neurological complication: Secondary | ICD-10-CM | POA: Diagnosis not present

## 2012-07-31 DIAGNOSIS — E1142 Type 2 diabetes mellitus with diabetic polyneuropathy: Secondary | ICD-10-CM | POA: Diagnosis not present

## 2012-08-05 DIAGNOSIS — Z13820 Encounter for screening for osteoporosis: Secondary | ICD-10-CM | POA: Diagnosis not present

## 2012-08-05 DIAGNOSIS — M1990 Unspecified osteoarthritis, unspecified site: Secondary | ICD-10-CM | POA: Diagnosis not present

## 2012-08-05 DIAGNOSIS — Z79899 Other long term (current) drug therapy: Secondary | ICD-10-CM | POA: Diagnosis not present

## 2012-08-05 DIAGNOSIS — G894 Chronic pain syndrome: Secondary | ICD-10-CM | POA: Diagnosis not present

## 2012-08-05 DIAGNOSIS — IMO0001 Reserved for inherently not codable concepts without codable children: Secondary | ICD-10-CM | POA: Diagnosis not present

## 2012-09-09 DIAGNOSIS — M1710 Unilateral primary osteoarthritis, unspecified knee: Secondary | ICD-10-CM | POA: Diagnosis not present

## 2012-09-09 DIAGNOSIS — G894 Chronic pain syndrome: Secondary | ICD-10-CM | POA: Diagnosis not present

## 2012-09-09 DIAGNOSIS — IMO0001 Reserved for inherently not codable concepts without codable children: Secondary | ICD-10-CM | POA: Diagnosis not present

## 2012-09-09 DIAGNOSIS — M069 Rheumatoid arthritis, unspecified: Secondary | ICD-10-CM | POA: Diagnosis not present

## 2012-09-23 DIAGNOSIS — J019 Acute sinusitis, unspecified: Secondary | ICD-10-CM | POA: Diagnosis not present

## 2012-09-23 DIAGNOSIS — H6500 Acute serous otitis media, unspecified ear: Secondary | ICD-10-CM | POA: Diagnosis not present

## 2012-09-24 DIAGNOSIS — Z79899 Other long term (current) drug therapy: Secondary | ICD-10-CM | POA: Diagnosis not present

## 2012-09-24 DIAGNOSIS — E559 Vitamin D deficiency, unspecified: Secondary | ICD-10-CM | POA: Diagnosis not present

## 2012-09-24 DIAGNOSIS — M069 Rheumatoid arthritis, unspecified: Secondary | ICD-10-CM | POA: Diagnosis not present

## 2012-09-25 DIAGNOSIS — E1142 Type 2 diabetes mellitus with diabetic polyneuropathy: Secondary | ICD-10-CM | POA: Diagnosis not present

## 2012-09-25 DIAGNOSIS — E1149 Type 2 diabetes mellitus with other diabetic neurological complication: Secondary | ICD-10-CM | POA: Diagnosis not present

## 2012-10-14 DIAGNOSIS — T07XXXA Unspecified multiple injuries, initial encounter: Secondary | ICD-10-CM | POA: Diagnosis not present

## 2012-10-14 DIAGNOSIS — IMO0002 Reserved for concepts with insufficient information to code with codable children: Secondary | ICD-10-CM | POA: Diagnosis not present

## 2012-11-11 DIAGNOSIS — G894 Chronic pain syndrome: Secondary | ICD-10-CM | POA: Diagnosis not present

## 2012-11-11 DIAGNOSIS — Z79899 Other long term (current) drug therapy: Secondary | ICD-10-CM | POA: Diagnosis not present

## 2012-11-11 DIAGNOSIS — IMO0001 Reserved for inherently not codable concepts without codable children: Secondary | ICD-10-CM | POA: Diagnosis not present

## 2012-11-13 ENCOUNTER — Other Ambulatory Visit: Payer: Self-pay | Admitting: Pain Medicine

## 2012-11-13 DIAGNOSIS — M25561 Pain in right knee: Secondary | ICD-10-CM

## 2012-11-18 ENCOUNTER — Ambulatory Visit (INDEPENDENT_AMBULATORY_CARE_PROVIDER_SITE_OTHER): Payer: Medicare Other | Admitting: Ophthalmology

## 2012-11-24 ENCOUNTER — Other Ambulatory Visit (HOSPITAL_COMMUNITY)
Admission: RE | Admit: 2012-11-24 | Discharge: 2012-11-24 | Disposition: A | Discharge status: Home / Self Care | Payer: Medicare Other | Source: Ambulatory Visit | Attending: Family Medicine | Admitting: Family Medicine

## 2012-11-24 ENCOUNTER — Other Ambulatory Visit: Payer: Self-pay | Admitting: Family Medicine

## 2012-11-24 DIAGNOSIS — Z1151 Encounter for screening for human papillomavirus (HPV): Secondary | ICD-10-CM | POA: Diagnosis not present

## 2012-11-24 DIAGNOSIS — R109 Unspecified abdominal pain: Secondary | ICD-10-CM | POA: Diagnosis not present

## 2012-11-24 DIAGNOSIS — Z136 Encounter for screening for cardiovascular disorders: Secondary | ICD-10-CM | POA: Diagnosis not present

## 2012-11-24 DIAGNOSIS — F411 Generalized anxiety disorder: Secondary | ICD-10-CM | POA: Diagnosis not present

## 2012-11-24 DIAGNOSIS — Z124 Encounter for screening for malignant neoplasm of cervix: Secondary | ICD-10-CM | POA: Diagnosis not present

## 2012-11-24 DIAGNOSIS — Z0000 Encounter for general adult medical examination without abnormal findings: Secondary | ICD-10-CM | POA: Diagnosis not present

## 2012-11-27 DIAGNOSIS — M25569 Pain in unspecified knee: Secondary | ICD-10-CM | POA: Diagnosis not present

## 2012-11-30 ENCOUNTER — Other Ambulatory Visit: Payer: Self-pay | Admitting: Family Medicine

## 2012-11-30 DIAGNOSIS — R109 Unspecified abdominal pain: Secondary | ICD-10-CM

## 2012-12-02 ENCOUNTER — Ambulatory Visit
Admission: RE | Admit: 2012-12-02 | Discharge: 2012-12-02 | Disposition: A | Discharge status: Home / Self Care | Payer: Medicare Other | Source: Ambulatory Visit | Attending: Family Medicine | Admitting: Family Medicine

## 2012-12-02 ENCOUNTER — Ambulatory Visit
Admission: RE | Admit: 2012-12-02 | Discharge: 2012-12-02 | Disposition: A | Discharge status: Home / Self Care | Payer: Medicare Other | Source: Ambulatory Visit | Attending: Pain Medicine | Admitting: Pain Medicine

## 2012-12-02 DIAGNOSIS — R109 Unspecified abdominal pain: Secondary | ICD-10-CM

## 2012-12-02 DIAGNOSIS — IMO0002 Reserved for concepts with insufficient information to code with codable children: Secondary | ICD-10-CM | POA: Diagnosis not present

## 2012-12-02 DIAGNOSIS — K5730 Diverticulosis of large intestine without perforation or abscess without bleeding: Secondary | ICD-10-CM | POA: Diagnosis not present

## 2012-12-02 DIAGNOSIS — M25561 Pain in right knee: Secondary | ICD-10-CM

## 2012-12-02 MED ORDER — IOHEXOL 300 MG/ML  SOLN: 100.0000 mL | Freq: Once | INTRAMUSCULAR | Status: DC | PRN

## 2012-12-09 DIAGNOSIS — M719 Bursopathy, unspecified: Secondary | ICD-10-CM | POA: Diagnosis not present

## 2012-12-09 DIAGNOSIS — Z79899 Other long term (current) drug therapy: Secondary | ICD-10-CM | POA: Diagnosis not present

## 2012-12-09 DIAGNOSIS — IMO0001 Reserved for inherently not codable concepts without codable children: Secondary | ICD-10-CM | POA: Diagnosis not present

## 2012-12-09 DIAGNOSIS — G894 Chronic pain syndrome: Secondary | ICD-10-CM | POA: Diagnosis not present

## 2012-12-09 DIAGNOSIS — M67919 Unspecified disorder of synovium and tendon, unspecified shoulder: Secondary | ICD-10-CM | POA: Diagnosis not present

## 2012-12-11 DIAGNOSIS — M25569 Pain in unspecified knee: Secondary | ICD-10-CM | POA: Diagnosis not present

## 2012-12-23 DIAGNOSIS — M069 Rheumatoid arthritis, unspecified: Secondary | ICD-10-CM | POA: Diagnosis not present

## 2012-12-23 DIAGNOSIS — Z79899 Other long term (current) drug therapy: Secondary | ICD-10-CM | POA: Diagnosis not present

## 2012-12-23 DIAGNOSIS — G894 Chronic pain syndrome: Secondary | ICD-10-CM | POA: Diagnosis not present

## 2012-12-23 DIAGNOSIS — IMO0001 Reserved for inherently not codable concepts without codable children: Secondary | ICD-10-CM | POA: Diagnosis not present

## 2013-01-15 DIAGNOSIS — E1149 Type 2 diabetes mellitus with other diabetic neurological complication: Secondary | ICD-10-CM | POA: Diagnosis not present

## 2013-01-15 DIAGNOSIS — E1142 Type 2 diabetes mellitus with diabetic polyneuropathy: Secondary | ICD-10-CM | POA: Diagnosis not present

## 2013-01-21 DIAGNOSIS — Y999 Unspecified external cause status: Secondary | ICD-10-CM | POA: Diagnosis not present

## 2013-01-21 DIAGNOSIS — G8918 Other acute postprocedural pain: Secondary | ICD-10-CM | POA: Diagnosis not present

## 2013-01-21 DIAGNOSIS — X58XXXA Exposure to other specified factors, initial encounter: Secondary | ICD-10-CM | POA: Diagnosis not present

## 2013-01-21 DIAGNOSIS — M6750 Plica syndrome, unspecified knee: Secondary | ICD-10-CM | POA: Diagnosis not present

## 2013-01-21 DIAGNOSIS — IMO0002 Reserved for concepts with insufficient information to code with codable children: Secondary | ICD-10-CM | POA: Diagnosis not present

## 2013-01-21 DIAGNOSIS — M9420 Chondromalacia, unspecified site: Secondary | ICD-10-CM | POA: Diagnosis not present

## 2013-01-21 DIAGNOSIS — M2390 Unspecified internal derangement of unspecified knee: Secondary | ICD-10-CM | POA: Diagnosis not present

## 2013-01-21 DIAGNOSIS — M2240 Chondromalacia patellae, unspecified knee: Secondary | ICD-10-CM | POA: Diagnosis not present

## 2013-01-21 DIAGNOSIS — S83289A Other tear of lateral meniscus, current injury, unspecified knee, initial encounter: Secondary | ICD-10-CM | POA: Diagnosis not present

## 2013-01-28 DIAGNOSIS — S83289A Other tear of lateral meniscus, current injury, unspecified knee, initial encounter: Secondary | ICD-10-CM | POA: Diagnosis not present

## 2013-01-28 DIAGNOSIS — Z4889 Encounter for other specified surgical aftercare: Secondary | ICD-10-CM | POA: Diagnosis not present

## 2013-02-05 DIAGNOSIS — L0231 Cutaneous abscess of buttock: Secondary | ICD-10-CM | POA: Diagnosis not present

## 2013-02-05 DIAGNOSIS — K5900 Constipation, unspecified: Secondary | ICD-10-CM | POA: Diagnosis not present

## 2013-02-05 DIAGNOSIS — R300 Dysuria: Secondary | ICD-10-CM | POA: Diagnosis not present

## 2013-02-11 DIAGNOSIS — Z79899 Other long term (current) drug therapy: Secondary | ICD-10-CM | POA: Diagnosis not present

## 2013-02-11 DIAGNOSIS — IMO0001 Reserved for inherently not codable concepts without codable children: Secondary | ICD-10-CM | POA: Diagnosis not present

## 2013-02-11 DIAGNOSIS — G5790 Unspecified mononeuropathy of unspecified lower limb: Secondary | ICD-10-CM | POA: Diagnosis not present

## 2013-02-11 DIAGNOSIS — G894 Chronic pain syndrome: Secondary | ICD-10-CM | POA: Diagnosis not present

## 2013-02-23 DIAGNOSIS — Z4889 Encounter for other specified surgical aftercare: Secondary | ICD-10-CM | POA: Diagnosis not present

## 2013-02-23 DIAGNOSIS — M25569 Pain in unspecified knee: Secondary | ICD-10-CM | POA: Diagnosis not present

## 2013-02-25 ENCOUNTER — Other Ambulatory Visit: Payer: Self-pay | Admitting: Family Medicine

## 2013-02-25 ENCOUNTER — Ambulatory Visit
Admission: RE | Admit: 2013-02-25 | Discharge: 2013-02-25 | Disposition: A | Discharge status: Home / Self Care | Payer: Medicare Other | Source: Ambulatory Visit | Attending: Family Medicine | Admitting: Family Medicine

## 2013-02-25 DIAGNOSIS — R5381 Other malaise: Secondary | ICD-10-CM | POA: Diagnosis not present

## 2013-02-25 DIAGNOSIS — R0602 Shortness of breath: Secondary | ICD-10-CM

## 2013-02-25 DIAGNOSIS — R109 Unspecified abdominal pain: Secondary | ICD-10-CM | POA: Diagnosis not present

## 2013-02-25 DIAGNOSIS — K449 Diaphragmatic hernia without obstruction or gangrene: Secondary | ICD-10-CM | POA: Diagnosis not present

## 2013-02-25 DIAGNOSIS — R42 Dizziness and giddiness: Secondary | ICD-10-CM | POA: Diagnosis not present

## 2013-03-05 DIAGNOSIS — Z4889 Encounter for other specified surgical aftercare: Secondary | ICD-10-CM | POA: Diagnosis not present

## 2013-03-18 DIAGNOSIS — Y929 Unspecified place or not applicable: Secondary | ICD-10-CM | POA: Diagnosis not present

## 2013-03-18 DIAGNOSIS — IMO0002 Reserved for concepts with insufficient information to code with codable children: Secondary | ICD-10-CM | POA: Diagnosis not present

## 2013-03-18 DIAGNOSIS — X58XXXA Exposure to other specified factors, initial encounter: Secondary | ICD-10-CM | POA: Diagnosis not present

## 2013-03-18 DIAGNOSIS — Y999 Unspecified external cause status: Secondary | ICD-10-CM | POA: Diagnosis not present

## 2013-03-18 DIAGNOSIS — M23305 Other meniscus derangements, unspecified medial meniscus, unspecified knee: Secondary | ICD-10-CM | POA: Diagnosis not present

## 2013-03-18 DIAGNOSIS — Y939 Activity, unspecified: Secondary | ICD-10-CM | POA: Diagnosis not present

## 2013-03-18 DIAGNOSIS — G8918 Other acute postprocedural pain: Secondary | ICD-10-CM | POA: Diagnosis not present

## 2013-03-31 DIAGNOSIS — Z23 Encounter for immunization: Secondary | ICD-10-CM | POA: Diagnosis not present

## 2013-04-08 DIAGNOSIS — IMO0001 Reserved for inherently not codable concepts without codable children: Secondary | ICD-10-CM | POA: Diagnosis not present

## 2013-04-08 DIAGNOSIS — G894 Chronic pain syndrome: Secondary | ICD-10-CM | POA: Diagnosis not present

## 2013-04-08 DIAGNOSIS — Z79899 Other long term (current) drug therapy: Secondary | ICD-10-CM | POA: Diagnosis not present

## 2013-04-08 DIAGNOSIS — M069 Rheumatoid arthritis, unspecified: Secondary | ICD-10-CM | POA: Diagnosis not present

## 2013-04-20 DIAGNOSIS — E1149 Type 2 diabetes mellitus with other diabetic neurological complication: Secondary | ICD-10-CM | POA: Diagnosis not present

## 2013-04-20 DIAGNOSIS — N951 Menopausal and female climacteric states: Secondary | ICD-10-CM | POA: Diagnosis not present

## 2013-04-20 DIAGNOSIS — E1142 Type 2 diabetes mellitus with diabetic polyneuropathy: Secondary | ICD-10-CM | POA: Diagnosis not present

## 2013-04-20 DIAGNOSIS — N760 Acute vaginitis: Secondary | ICD-10-CM | POA: Diagnosis not present

## 2013-06-16 ENCOUNTER — Ambulatory Visit
Admission: RE | Admit: 2013-06-16 | Discharge: 2013-06-16 | Disposition: A | Discharge status: Home / Self Care | Payer: Medicare Other | Source: Ambulatory Visit | Attending: Family Medicine | Admitting: Family Medicine

## 2013-06-16 ENCOUNTER — Other Ambulatory Visit: Payer: Self-pay | Admitting: Family Medicine

## 2013-06-16 DIAGNOSIS — J209 Acute bronchitis, unspecified: Secondary | ICD-10-CM

## 2013-06-16 DIAGNOSIS — R05 Cough: Secondary | ICD-10-CM | POA: Diagnosis not present

## 2013-07-06 DIAGNOSIS — R0602 Shortness of breath: Secondary | ICD-10-CM | POA: Diagnosis not present

## 2013-07-06 DIAGNOSIS — F17200 Nicotine dependence, unspecified, uncomplicated: Secondary | ICD-10-CM | POA: Diagnosis not present

## 2013-07-22 DIAGNOSIS — E1149 Type 2 diabetes mellitus with other diabetic neurological complication: Secondary | ICD-10-CM | POA: Diagnosis not present

## 2013-07-22 DIAGNOSIS — E1142 Type 2 diabetes mellitus with diabetic polyneuropathy: Secondary | ICD-10-CM | POA: Diagnosis not present

## 2013-07-22 DIAGNOSIS — Z87891 Personal history of nicotine dependence: Secondary | ICD-10-CM | POA: Diagnosis not present

## 2013-08-03 ENCOUNTER — Other Ambulatory Visit: Payer: Self-pay | Admitting: Family Medicine

## 2013-08-03 ENCOUNTER — Ambulatory Visit (INDEPENDENT_AMBULATORY_CARE_PROVIDER_SITE_OTHER): Payer: Medicare Other | Admitting: Internal Medicine

## 2013-08-03 DIAGNOSIS — R0602 Shortness of breath: Secondary | ICD-10-CM

## 2013-08-03 LAB — PULMONARY FUNCTION TEST
DL/VA % PRED: 96 %
DL/VA: 3.94 ml/min/mmHg/L
DLCO UNC: 13.06 ml/min/mmHg
DLCO unc % pred: 74 %
FEF 25-75 PRE: 1.1 L/s
FEF 25-75 Post: 3.54 L/sec
FEF2575-%Change-Post: 220 %
FEF2575-%PRED-PRE: 48 %
FEF2575-%Pred-Post: 154 %
FEV1-%CHANGE-POST: 39 %
FEV1-%Pred-Post: 87 %
FEV1-%Pred-Pre: 62 %
FEV1-Post: 1.96 L
FEV1-Pre: 1.4 L
FEV1FVC-%Change-Post: 5 %
FEV1FVC-%Pred-Pre: 95 %
FEV6-%Change-Post: 32 %
FEV6-%PRED-POST: 88 %
FEV6-%PRED-PRE: 67 %
FEV6-POST: 2.44 L
FEV6-PRE: 1.85 L
FEV6FVC-%PRED-PRE: 103 %
FEV6FVC-%Pred-Post: 103 %
FVC-%Change-Post: 32 %
FVC-%PRED-PRE: 64 %
FVC-%Pred-Post: 85 %
FVC-POST: 2.44 L
FVC-Pre: 1.85 L
POST FEV6/FVC RATIO: 100 %
Post FEV1/FVC ratio: 80 %
Pre FEV1/FVC ratio: 76 %
Pre FEV6/FVC Ratio: 100 %
RV % pred: 78 %
RV: 1.29 L
TLC % PRED: 88 %
TLC: 3.82 L

## 2013-08-03 NOTE — Progress Notes (Signed)
PFT done today. 

## 2013-08-06 DIAGNOSIS — J449 Chronic obstructive pulmonary disease, unspecified: Secondary | ICD-10-CM | POA: Diagnosis not present

## 2013-08-06 DIAGNOSIS — R079 Chest pain, unspecified: Secondary | ICD-10-CM | POA: Diagnosis not present

## 2013-08-06 DIAGNOSIS — R51 Headache: Secondary | ICD-10-CM | POA: Diagnosis not present

## 2013-08-07 DIAGNOSIS — R0789 Other chest pain: Secondary | ICD-10-CM | POA: Diagnosis not present

## 2013-08-07 DIAGNOSIS — G500 Trigeminal neuralgia: Secondary | ICD-10-CM | POA: Diagnosis not present

## 2013-08-10 DIAGNOSIS — Z1231 Encounter for screening mammogram for malignant neoplasm of breast: Secondary | ICD-10-CM | POA: Diagnosis not present

## 2013-09-09 DIAGNOSIS — E1149 Type 2 diabetes mellitus with other diabetic neurological complication: Secondary | ICD-10-CM | POA: Diagnosis not present

## 2013-09-09 DIAGNOSIS — E1142 Type 2 diabetes mellitus with diabetic polyneuropathy: Secondary | ICD-10-CM | POA: Diagnosis not present

## 2013-09-09 DIAGNOSIS — Z87891 Personal history of nicotine dependence: Secondary | ICD-10-CM | POA: Diagnosis not present

## 2013-09-22 DIAGNOSIS — M6530 Trigger finger, unspecified finger: Secondary | ICD-10-CM | POA: Diagnosis not present

## 2013-09-22 DIAGNOSIS — M19049 Primary osteoarthritis, unspecified hand: Secondary | ICD-10-CM | POA: Diagnosis not present

## 2013-09-30 DIAGNOSIS — E1149 Type 2 diabetes mellitus with other diabetic neurological complication: Secondary | ICD-10-CM | POA: Diagnosis not present

## 2013-09-30 DIAGNOSIS — Z87891 Personal history of nicotine dependence: Secondary | ICD-10-CM | POA: Diagnosis not present

## 2013-09-30 DIAGNOSIS — E1142 Type 2 diabetes mellitus with diabetic polyneuropathy: Secondary | ICD-10-CM | POA: Diagnosis not present

## 2013-10-05 ENCOUNTER — Encounter: Payer: Medicare Other | Attending: Internal Medicine | Admitting: *Deleted

## 2013-10-05 VITALS — Ht <= 58 in | Wt 122.7 lb

## 2013-10-05 DIAGNOSIS — Z713 Dietary counseling and surveillance: Secondary | ICD-10-CM | POA: Diagnosis not present

## 2013-10-05 DIAGNOSIS — Z794 Long term (current) use of insulin: Secondary | ICD-10-CM | POA: Insufficient documentation

## 2013-10-05 DIAGNOSIS — M19049 Primary osteoarthritis, unspecified hand: Secondary | ICD-10-CM | POA: Diagnosis not present

## 2013-10-05 DIAGNOSIS — E1165 Type 2 diabetes mellitus with hyperglycemia: Secondary | ICD-10-CM

## 2013-10-05 DIAGNOSIS — IMO0001 Reserved for inherently not codable concepts without codable children: Secondary | ICD-10-CM

## 2013-10-05 DIAGNOSIS — E119 Type 2 diabetes mellitus without complications: Secondary | ICD-10-CM | POA: Insufficient documentation

## 2013-10-06 ENCOUNTER — Encounter: Payer: Medicare Other | Admitting: *Deleted

## 2013-10-06 DIAGNOSIS — E119 Type 2 diabetes mellitus without complications: Secondary | ICD-10-CM

## 2013-10-07 ENCOUNTER — Encounter: Payer: Self-pay | Admitting: *Deleted

## 2013-10-07 DIAGNOSIS — R109 Unspecified abdominal pain: Secondary | ICD-10-CM | POA: Diagnosis not present

## 2013-10-07 DIAGNOSIS — J449 Chronic obstructive pulmonary disease, unspecified: Secondary | ICD-10-CM | POA: Diagnosis not present

## 2013-10-07 DIAGNOSIS — J309 Allergic rhinitis, unspecified: Secondary | ICD-10-CM | POA: Diagnosis not present

## 2013-10-07 NOTE — Progress Notes (Signed)
Introduction to Insulin Pump Therapy:  Appt start time: 1300 end time:  1400.  Follow up to yesterdays visit for Intro to Pumping  Intervention:    I feel the [employer] pump is best suited in terms of ease of giving a bolus and filling the reservoir with her limited dexterity of her hands with arthritis   Today I demonstrated pump, insulin reservoir and infusion set options, and button pushing for bolus delivery of insulin through the pump  Explained importance of testing BG at least 4 times per day for appropriate correction of high BG and prevention of DKA as applicable.  Emphasized importance of follow up after Pump Start for appropriate pump setting adjustments and on-going training on more advanced features.  Offered for her to insert Quick Set infusion set, which she did successfully  We also discusses CGM, the benefits as well as cost and lack of coverage by Medicare and Medicaid.  Handouts given during visit include:  Insulin Pump Packet from [employer]  Monitoring/Evaluation:    Patient does want to continue with pursuit of insulin pump.  She completed the AOB and Health Assessment Form which I faxed to Ernestene Kiel with [employer] to start the process  Follow OK:HTXH pump is shipped for pump training. May decide to do a [place] Start to familiarize her with the pump before making insulin decisions.

## 2013-10-07 NOTE — Progress Notes (Signed)
Introduction to Insulin Pump Therapy:  Appt start time: 1400 end time:  1500.  Assessment:  This patient has DM 2 and their primary concerns today: to learn more about pump therapy and to choose the best pump for her. She has arthritis so her dexterity of her hands is limited.  She is here with her friend Deija who participated in the visit. This patient is interested in learning more about insulin pump therapy because she wants better control of her diabetes.  MEDICATIONS: Basal Insulin: 40 units of Humalog in V-Go 40 plus an additional 10 units Levemir at bedtime Bolus Insulin: up to 26 units of Humalog via V-Go 40 @ 6 button pushes (12 units) at each meal Total of insulin doses per day 76 units Other diabetes medications:none  This patient is not currently adjusting bolus insulin based BG  This patient is not currently adjusting bolus insulin based on carb intake   Patient states knowledge of Carb Counting is fair  Usual physical activity: not at this time partially due to arthritis pain  Last A1c was 10.9 on this date 09/10/13 Patient states their biggest barrier with diabetes is variability of BG daily  Progress Towards Obtaining an Insulin PumpGoal(s):  In progress.  Patient states their expectations of pump therapy include: more stable BGs with ability to adjust for food intake and to give corrections for high BG Patient expresses understanding that for improved outcomes for their diabetes on an insulin pump they will:  Check BG 4-6 times per day  Change out pump infusion set at least every 3 days  She does not have a computer so she will not be able to upload pump information to software on a regular basis so provider can assess patterns and make setting adjustments. But we discussed doing that in my office instead     Intervention:    Taught difference between delivery of insulin via syringe/pen compared to insulin pump.  Demonstrated improved insulin delivery via pump due  to improved accuracy of dose and flexibility of adjusting bolus insulin based on carb intake and BG correction.  Due to time constraints I was not able to show her any pump demo, plan to continue tomorrow.  Handouts given during visit include:  Intro to Pumping handout  Monitoring/Evaluation:    Patient does  want to continue with pursuit of insulin pump.   {follow EU:MPNTIRWE for pump demo and further Intro to pumping information.

## 2013-10-20 DIAGNOSIS — E1149 Type 2 diabetes mellitus with other diabetic neurological complication: Secondary | ICD-10-CM | POA: Diagnosis not present

## 2013-10-22 ENCOUNTER — Encounter: Payer: Medicare Other | Admitting: *Deleted

## 2013-10-25 NOTE — Progress Notes (Signed)
Patient here for 2nd visit for Intro to Pumping so she can see the [employer] Snap pump. The Programme researcher, broadcasting/film/video is here to demonstrate this pump especially concerning the patient's limitations with her arthritis. She is waiting to find out the results of the C-Peptide test to see if her insurance will cover pump therapy at all. She will decide at that time which pump she will order.

## 2013-11-04 DIAGNOSIS — R112 Nausea with vomiting, unspecified: Secondary | ICD-10-CM | POA: Diagnosis not present

## 2013-11-04 DIAGNOSIS — E1149 Type 2 diabetes mellitus with other diabetic neurological complication: Secondary | ICD-10-CM | POA: Diagnosis not present

## 2013-11-04 DIAGNOSIS — R197 Diarrhea, unspecified: Secondary | ICD-10-CM | POA: Diagnosis not present

## 2013-11-04 DIAGNOSIS — E1142 Type 2 diabetes mellitus with diabetic polyneuropathy: Secondary | ICD-10-CM | POA: Diagnosis not present

## 2013-11-12 ENCOUNTER — Encounter: Payer: Medicare Other | Attending: Internal Medicine | Admitting: *Deleted

## 2013-11-12 DIAGNOSIS — E119 Type 2 diabetes mellitus without complications: Secondary | ICD-10-CM | POA: Insufficient documentation

## 2013-11-12 DIAGNOSIS — Z794 Long term (current) use of insulin: Secondary | ICD-10-CM | POA: Insufficient documentation

## 2013-11-12 DIAGNOSIS — IMO0002 Reserved for concepts with insufficient information to code with codable children: Secondary | ICD-10-CM

## 2013-11-12 DIAGNOSIS — Z713 Dietary counseling and surveillance: Secondary | ICD-10-CM | POA: Insufficient documentation

## 2013-11-12 DIAGNOSIS — M19049 Primary osteoarthritis, unspecified hand: Secondary | ICD-10-CM | POA: Insufficient documentation

## 2013-11-12 DIAGNOSIS — E1065 Type 1 diabetes mellitus with hyperglycemia: Secondary | ICD-10-CM

## 2013-11-15 ENCOUNTER — Encounter: Payer: Medicare Other | Admitting: *Deleted

## 2013-11-15 DIAGNOSIS — E1065 Type 1 diabetes mellitus with hyperglycemia: Secondary | ICD-10-CM

## 2013-11-15 DIAGNOSIS — IMO0002 Reserved for concepts with insufficient information to code with codable children: Secondary | ICD-10-CM

## 2013-11-16 DIAGNOSIS — H04129 Dry eye syndrome of unspecified lacrimal gland: Secondary | ICD-10-CM | POA: Diagnosis not present

## 2013-11-16 DIAGNOSIS — E119 Type 2 diabetes mellitus without complications: Secondary | ICD-10-CM | POA: Diagnosis not present

## 2013-11-16 DIAGNOSIS — Z961 Presence of intraocular lens: Secondary | ICD-10-CM | POA: Diagnosis not present

## 2013-11-18 ENCOUNTER — Ambulatory Visit: Payer: Medicare Other | Admitting: *Deleted

## 2013-11-18 ENCOUNTER — Encounter: Payer: Medicare Other | Admitting: *Deleted

## 2013-11-18 DIAGNOSIS — IMO0002 Reserved for concepts with insufficient information to code with codable children: Secondary | ICD-10-CM

## 2013-11-18 DIAGNOSIS — E1065 Type 1 diabetes mellitus with hyperglycemia: Secondary | ICD-10-CM

## 2013-11-18 NOTE — Patient Instructions (Signed)
Plan: You are doing a great job! We increased your Basal Rate at 3 PM from 1.70 to 1.80 for 3 hours to help lower your supper BG We lowered your Target from 150 to 130 mg/dl to move towards your eventual goal Target of 100-100 You can now reduce your BG testing from 8 to 4 times a day, pre meal and at bedtime Continue using the Bolus Wizard for meals and correction doses

## 2013-11-19 DIAGNOSIS — M6530 Trigger finger, unspecified finger: Secondary | ICD-10-CM | POA: Diagnosis not present

## 2013-11-19 DIAGNOSIS — M19049 Primary osteoarthritis, unspecified hand: Secondary | ICD-10-CM | POA: Diagnosis not present

## 2013-11-19 NOTE — Progress Notes (Signed)
Insulin Pump Start Progress Note:  Patient appointment start time: 1100  End time 1300  Patient here for insulin pump start on [employer] pump and Quick Set infusion set Orders with pump settings received from MD Patient completed Pre- training by  return demonstration  Reviewed Pump Set Up including  Menu Settings  Bolus with Carb Ratio of 1 unit / 6 grams Carb, Correction Factor of 1 unit / 20 mg/dl  Suspend  Basal with initial Basal Rate of 1.70 units/hour  Reservoir Set Up  Utilities Pump Training Checklist completed Did not use Temp Basal as she only took a portion of her long acting insulin yesterday  Patient is signed up for [employer] but does not have a computer at home so we will upload in my office for review of progress and allow for pump setting adjustments  Patient successfully completed pump start and instructed to call [facility] if BG drops below 60 mg/dl or goes above 300 mg/dl or as directed by MD  Follow up plan: Plan to see patient in 3 days to assist with first pump reservoir change out.

## 2013-11-23 NOTE — Progress Notes (Signed)
  Pump Follow Up Progress Note  Orders received from MD giving me permission to make insulin pump adjustments for the following patient.  Today's visit was just to provide support for her first change out of the reservoir and infusion set. She brought all needed supplies, and was prepared for the visit. She did have excessive insulin left in the reservoir so we determined that based on her actual TDD of insulin on the pump of only 60 units per day, she could reduce the 3 day total to about 220 units instead of filling it completely to 300 units. She completed the change out successfully with only minor questions.   She has been recording BG as requested at pre and 2 hour post meal times, bedtime and 3 AM.  No changes made to pump settings today.  Plan: Patient to continue using the Bolus Wizard with current settings. She is to contact me if BG goes below 70 or above 300 mg/dl.  Follow up: patient to return in 3 days to upload her pump to [facility] so I can assess her BG patterns to see if pump settings need to be changed.

## 2013-11-26 NOTE — Progress Notes (Signed)
  Pump Follow Up Progress Note on 11/18/13  Orders received from MD giving me permission to make insulin pump adjustments for the following patient.  Today's visit was to upload her pump to [facility] so reports can be reviewed and settings adjusted as needed. Also to providesupport for hersecond change out of the reservoir and infusion set.                   Hypoglycemia Hyperglycemia Comments  Overnight Period:      Pre-Meal:    Breakfast      Lunch      Supper  YES BASAL   Post-Meal: Breakfast      Lunch      Supper     Bedtime:       Comments: Average BG for first 6 days on the pump is 156 +/- 62 mg/dl. Infrequent hypoglycemia and those were associated with increased activity without using the Temp Basal, which we reviewed today. Gradual rise in BG between lunch and supper so plan to increase Basal Rate in the afternoon. No other changes today. She was able to change out reservoir and infusion set correctly without assistance today.  NOTE: Target Range per Dr. Buddy Duty to be 100-100. When patient started on pump she was very apprehensive of this tight of a target and since she lives alone, I set her pump target @ 100-150 initially. Patient is doing very well and was willing to tighten the target to 100-130 today and plans to move to 100-100 in the near future.   Pump Settings: Date: Current Date: 11/18/13  Changes in bold print   Basal Rate: Carb Ratio Sensitivity  Basal Rate: Carb Ratio Sensitivity   MN: 1.70 6.0 20 MN: 1.70 6.0 20      3PM 1.80        6 PM 1.70                                         Plan: patient to continue to use Bolus Wizard and change out pump every 3 days. She is to call me if BG below 70 or above 300 mg/dl. She will follow up with Dr. Buddy Duty and then with me in about a month to continue pump education with [employer].

## 2013-12-03 DIAGNOSIS — R197 Diarrhea, unspecified: Secondary | ICD-10-CM | POA: Diagnosis not present

## 2013-12-03 DIAGNOSIS — E1149 Type 2 diabetes mellitus with other diabetic neurological complication: Secondary | ICD-10-CM | POA: Diagnosis not present

## 2013-12-03 DIAGNOSIS — E1142 Type 2 diabetes mellitus with diabetic polyneuropathy: Secondary | ICD-10-CM | POA: Diagnosis not present

## 2013-12-03 DIAGNOSIS — R112 Nausea with vomiting, unspecified: Secondary | ICD-10-CM | POA: Diagnosis not present

## 2013-12-07 DIAGNOSIS — E1149 Type 2 diabetes mellitus with other diabetic neurological complication: Secondary | ICD-10-CM | POA: Diagnosis not present

## 2013-12-07 DIAGNOSIS — R112 Nausea with vomiting, unspecified: Secondary | ICD-10-CM | POA: Diagnosis not present

## 2013-12-15 DIAGNOSIS — M6530 Trigger finger, unspecified finger: Secondary | ICD-10-CM | POA: Diagnosis not present

## 2013-12-17 ENCOUNTER — Encounter (HOSPITAL_BASED_OUTPATIENT_CLINIC_OR_DEPARTMENT_OTHER): Payer: Self-pay | Admitting: *Deleted

## 2013-12-17 DIAGNOSIS — K921 Melena: Secondary | ICD-10-CM | POA: Diagnosis not present

## 2013-12-17 DIAGNOSIS — R198 Other specified symptoms and signs involving the digestive system and abdomen: Secondary | ICD-10-CM | POA: Diagnosis not present

## 2013-12-17 DIAGNOSIS — R109 Unspecified abdominal pain: Secondary | ICD-10-CM | POA: Diagnosis not present

## 2013-12-17 NOTE — Progress Notes (Signed)
Pt has insulin pump-doing well-will be sure to have hs snack-arrive 645am May need ekg istat-called for recent labs dr Buddy Duty

## 2013-12-20 ENCOUNTER — Encounter (HOSPITAL_BASED_OUTPATIENT_CLINIC_OR_DEPARTMENT_OTHER): Payer: Self-pay | Admitting: Certified Registered"

## 2013-12-20 ENCOUNTER — Ambulatory Visit (HOSPITAL_BASED_OUTPATIENT_CLINIC_OR_DEPARTMENT_OTHER): Payer: Medicare Other | Admitting: Certified Registered"

## 2013-12-20 ENCOUNTER — Encounter (HOSPITAL_BASED_OUTPATIENT_CLINIC_OR_DEPARTMENT_OTHER)
Admission: RE | Disposition: A | Discharge status: Home / Self Care | Payer: Self-pay | Source: Ambulatory Visit | Attending: Orthopedic Surgery

## 2013-12-20 ENCOUNTER — Encounter (HOSPITAL_BASED_OUTPATIENT_CLINIC_OR_DEPARTMENT_OTHER): Payer: Medicare Other | Admitting: Certified Registered"

## 2013-12-20 ENCOUNTER — Ambulatory Visit (HOSPITAL_BASED_OUTPATIENT_CLINIC_OR_DEPARTMENT_OTHER)
Admission: RE | Admit: 2013-12-20 | Discharge: 2013-12-20 | Disposition: A | Discharge status: Home / Self Care | Payer: Medicare Other | Source: Ambulatory Visit | Attending: Orthopedic Surgery | Admitting: Orthopedic Surgery

## 2013-12-20 DIAGNOSIS — K219 Gastro-esophageal reflux disease without esophagitis: Secondary | ICD-10-CM | POA: Insufficient documentation

## 2013-12-20 DIAGNOSIS — IMO0001 Reserved for inherently not codable concepts without codable children: Secondary | ICD-10-CM | POA: Insufficient documentation

## 2013-12-20 DIAGNOSIS — K648 Other hemorrhoids: Secondary | ICD-10-CM | POA: Insufficient documentation

## 2013-12-20 DIAGNOSIS — J449 Chronic obstructive pulmonary disease, unspecified: Secondary | ICD-10-CM | POA: Insufficient documentation

## 2013-12-20 DIAGNOSIS — M069 Rheumatoid arthritis, unspecified: Secondary | ICD-10-CM | POA: Insufficient documentation

## 2013-12-20 DIAGNOSIS — Z794 Long term (current) use of insulin: Secondary | ICD-10-CM | POA: Diagnosis not present

## 2013-12-20 DIAGNOSIS — Z880 Allergy status to penicillin: Secondary | ICD-10-CM | POA: Insufficient documentation

## 2013-12-20 DIAGNOSIS — E1142 Type 2 diabetes mellitus with diabetic polyneuropathy: Secondary | ICD-10-CM | POA: Insufficient documentation

## 2013-12-20 DIAGNOSIS — M6530 Trigger finger, unspecified finger: Secondary | ICD-10-CM | POA: Diagnosis not present

## 2013-12-20 DIAGNOSIS — E785 Hyperlipidemia, unspecified: Secondary | ICD-10-CM | POA: Insufficient documentation

## 2013-12-20 DIAGNOSIS — Z9641 Presence of insulin pump (external) (internal): Secondary | ICD-10-CM | POA: Insufficient documentation

## 2013-12-20 DIAGNOSIS — F17200 Nicotine dependence, unspecified, uncomplicated: Secondary | ICD-10-CM | POA: Diagnosis not present

## 2013-12-20 DIAGNOSIS — K5730 Diverticulosis of large intestine without perforation or abscess without bleeding: Secondary | ICD-10-CM | POA: Insufficient documentation

## 2013-12-20 DIAGNOSIS — F1421 Cocaine dependence, in remission: Secondary | ICD-10-CM | POA: Diagnosis not present

## 2013-12-20 DIAGNOSIS — E1149 Type 2 diabetes mellitus with other diabetic neurological complication: Secondary | ICD-10-CM | POA: Insufficient documentation

## 2013-12-20 DIAGNOSIS — M6740 Ganglion, unspecified site: Secondary | ICD-10-CM | POA: Diagnosis not present

## 2013-12-20 DIAGNOSIS — J4489 Other specified chronic obstructive pulmonary disease: Secondary | ICD-10-CM | POA: Insufficient documentation

## 2013-12-20 HISTORY — DX: Presence of dental prosthetic device (complete) (partial): Z97.2

## 2013-12-20 HISTORY — DX: Other specified postprocedural states: R11.2

## 2013-12-20 HISTORY — DX: Complete loss of teeth, unspecified cause, unspecified class: K08.109

## 2013-12-20 HISTORY — DX: Presence of spectacles and contact lenses: Z97.3

## 2013-12-20 HISTORY — DX: Other specified postprocedural states: Z98.890

## 2013-12-20 HISTORY — DX: Chronic obstructive pulmonary disease, unspecified: J44.9

## 2013-12-20 HISTORY — PX: TRIGGER FINGER RELEASE: SHX641

## 2013-12-20 LAB — POCT I-STAT, CHEM 8
BUN: 13 mg/dL (ref 6–23)
CHLORIDE: 109 meq/L (ref 96–112)
CREATININE: 0.9 mg/dL (ref 0.50–1.10)
Calcium, Ion: 1.35 mmol/L — ABNORMAL HIGH (ref 1.12–1.23)
GLUCOSE: 155 mg/dL — AB (ref 70–99)
HEMATOCRIT: 48 % — AB (ref 36.0–46.0)
HEMOGLOBIN: 16.3 g/dL — AB (ref 12.0–15.0)
POTASSIUM: 3.9 meq/L (ref 3.7–5.3)
Sodium: 140 mEq/L (ref 137–147)
TCO2: 21 mmol/L (ref 0–100)

## 2013-12-20 LAB — GLUCOSE, CAPILLARY: Glucose-Capillary: 197 mg/dL — ABNORMAL HIGH (ref 70–99)

## 2013-12-20 SURGERY — RELEASE, A1 PULLEY, FOR TRIGGER FINGER
Anesthesia: General | Site: Thumb | Laterality: Right

## 2013-12-20 MED ORDER — VANCOMYCIN HCL IN DEXTROSE 1-5 GM/200ML-% IV SOLN
INTRAVENOUS | Status: AC
  Filled 2013-12-20: qty 200

## 2013-12-20 MED ORDER — MIDAZOLAM HCL 2 MG/2ML IJ SOLN
INTRAMUSCULAR | Status: AC
  Filled 2013-12-20: qty 2

## 2013-12-20 MED ORDER — EPHEDRINE SULFATE 50 MG/ML IJ SOLN
INTRAMUSCULAR | Status: DC | PRN
  Administered 2013-12-20: 10 mg via INTRAVENOUS

## 2013-12-20 MED ORDER — OXYCODONE HCL 5 MG PO TABS: 5.0000 mg | ORAL_TABLET | Freq: Once | ORAL | Status: DC | PRN

## 2013-12-20 MED ORDER — ONDANSETRON HCL 4 MG/2ML IJ SOLN: 4.0000 mg | Freq: Once | INTRAMUSCULAR | Status: DC | PRN

## 2013-12-20 MED ORDER — VANCOMYCIN HCL IN DEXTROSE 1-5 GM/200ML-% IV SOLN
1000.0000 mg | INTRAVENOUS | Status: AC
  Administered 2013-12-20: 1000 mg via INTRAVENOUS

## 2013-12-20 MED ORDER — MEPERIDINE HCL 25 MG/ML IJ SOLN: 6.2500 mg | INTRAMUSCULAR | Status: DC | PRN

## 2013-12-20 MED ORDER — MIDAZOLAM HCL 2 MG/2ML IJ SOLN: 1.0000 mg | INTRAMUSCULAR | Status: DC | PRN

## 2013-12-20 MED ORDER — FENTANYL CITRATE 0.05 MG/ML IJ SOLN
INTRAMUSCULAR | Status: AC
  Filled 2013-12-20: qty 6

## 2013-12-20 MED ORDER — CHLORHEXIDINE GLUCONATE 4 % EX LIQD: 60.0000 mL | Freq: Once | CUTANEOUS | Status: DC

## 2013-12-20 MED ORDER — FENTANYL CITRATE 0.05 MG/ML IJ SOLN
INTRAMUSCULAR | Status: DC | PRN
  Administered 2013-12-20: 50 ug via INTRAVENOUS

## 2013-12-20 MED ORDER — HYDROMORPHONE HCL PF 1 MG/ML IJ SOLN
0.2500 mg | INTRAMUSCULAR | Status: DC | PRN
Start: 2013-12-20 — End: 2013-12-20

## 2013-12-20 MED ORDER — OXYCODONE HCL 5 MG/5ML PO SOLN: 5.0000 mg | Freq: Once | ORAL | Status: DC | PRN

## 2013-12-20 MED ORDER — HYDROCODONE-ACETAMINOPHEN 5-325 MG PO TABS: ORAL_TABLET | ORAL | Status: DC

## 2013-12-20 MED ORDER — BUPIVACAINE HCL (PF) 0.25 % IJ SOLN
INTRAMUSCULAR | Status: AC
  Filled 2013-12-20: qty 30

## 2013-12-20 MED ORDER — LIDOCAINE HCL (CARDIAC) 20 MG/ML IV SOLN
INTRAVENOUS | Status: DC | PRN
  Administered 2013-12-20: 60 mg via INTRAVENOUS

## 2013-12-20 MED ORDER — BUPIVACAINE HCL (PF) 0.25 % IJ SOLN
INTRAMUSCULAR | Status: DC | PRN
  Administered 2013-12-20: 7 mL

## 2013-12-20 MED ORDER — DEXAMETHASONE SODIUM PHOSPHATE 10 MG/ML IJ SOLN
INTRAMUSCULAR | Status: DC | PRN
  Administered 2013-12-20: 4 mg via INTRAVENOUS

## 2013-12-20 MED ORDER — MIDAZOLAM HCL 5 MG/5ML IJ SOLN
INTRAMUSCULAR | Status: DC | PRN
  Administered 2013-12-20: 2 mg via INTRAVENOUS

## 2013-12-20 MED ORDER — ONDANSETRON HCL 4 MG/2ML IJ SOLN
INTRAMUSCULAR | Status: DC | PRN
  Administered 2013-12-20: 4 mg via INTRAVENOUS

## 2013-12-20 MED ORDER — PROPOFOL 10 MG/ML IV BOLUS
INTRAVENOUS | Status: DC | PRN
  Administered 2013-12-20: 150 mg via INTRAVENOUS

## 2013-12-20 MED ORDER — LACTATED RINGERS IV SOLN
INTRAVENOUS | Status: DC
  Administered 2013-12-20 (×2): via INTRAVENOUS

## 2013-12-20 MED ORDER — FENTANYL CITRATE 0.05 MG/ML IJ SOLN: 50.0000 ug | INTRAMUSCULAR | Status: DC | PRN

## 2013-12-20 SURGICAL SUPPLY — 35 items
BANDAGE COBAN STERILE 2 (GAUZE/BANDAGES/DRESSINGS) ×3 IMPLANT
BLADE MINI RND TIP GREEN BEAV (BLADE) IMPLANT
BLADE SURG 15 STRL LF DISP TIS (BLADE) ×2 IMPLANT
BLADE SURG 15 STRL SS (BLADE) ×4
BNDG CONFORM 2 STRL LF (GAUZE/BANDAGES/DRESSINGS) ×3 IMPLANT
BNDG ESMARK 4X9 LF (GAUZE/BANDAGES/DRESSINGS) ×3 IMPLANT
CHLORAPREP W/TINT 26ML (MISCELLANEOUS) ×3 IMPLANT
CORDS BIPOLAR (ELECTRODE) ×3 IMPLANT
COVER MAYO STAND STRL (DRAPES) ×3 IMPLANT
COVER TABLE BACK 60X90 (DRAPES) ×3 IMPLANT
CUFF TOURNIQUET SINGLE 18IN (TOURNIQUET CUFF) ×3 IMPLANT
DRAPE EXTREMITY T 121X128X90 (DRAPE) ×3 IMPLANT
DRAPE SURG 17X23 STRL (DRAPES) ×3 IMPLANT
GAUZE SPONGE 4X4 12PLY STRL (GAUZE/BANDAGES/DRESSINGS) ×3 IMPLANT
GAUZE XEROFORM 1X8 LF (GAUZE/BANDAGES/DRESSINGS) ×3 IMPLANT
GLOVE BIO SURGEON STRL SZ7.5 (GLOVE) ×3 IMPLANT
GLOVE BIOGEL PI IND STRL 7.0 (GLOVE) ×1 IMPLANT
GLOVE BIOGEL PI IND STRL 8 (GLOVE) ×1 IMPLANT
GLOVE BIOGEL PI INDICATOR 7.0 (GLOVE) ×2
GLOVE BIOGEL PI INDICATOR 8 (GLOVE) ×2
GLOVE ECLIPSE 7.0 STRL STRAW (GLOVE) ×3 IMPLANT
GOWN STRL REUS W/ TWL LRG LVL3 (GOWN DISPOSABLE) ×1 IMPLANT
GOWN STRL REUS W/TWL LRG LVL3 (GOWN DISPOSABLE) ×2
GOWN STRL REUS W/TWL XL LVL3 (GOWN DISPOSABLE) ×3 IMPLANT
NEEDLE HYPO 25X1 1.5 SAFETY (NEEDLE) ×3 IMPLANT
NS IRRIG 1000ML POUR BTL (IV SOLUTION) ×3 IMPLANT
PACK BASIN DAY SURGERY FS (CUSTOM PROCEDURE TRAY) ×3 IMPLANT
PADDING CAST ABS 4INX4YD NS (CAST SUPPLIES)
PADDING CAST ABS COTTON 4X4 ST (CAST SUPPLIES) IMPLANT
STOCKINETTE 4X48 STRL (DRAPES) ×3 IMPLANT
SUT ETHILON 4 0 PS 2 18 (SUTURE) ×3 IMPLANT
SYR BULB 3OZ (MISCELLANEOUS) ×3 IMPLANT
SYR CONTROL 10ML LL (SYRINGE) ×3 IMPLANT
TOWEL OR 17X24 6PK STRL BLUE (TOWEL DISPOSABLE) ×6 IMPLANT
UNDERPAD 30X30 INCONTINENT (UNDERPADS AND DIAPERS) ×3 IMPLANT

## 2013-12-20 NOTE — Anesthesia Preprocedure Evaluation (Signed)
Anesthesia Evaluation  Patient identified by MRN, date of birth, ID band Patient awake    Reviewed: Allergy & Precautions, H&P , NPO status , Patient's Chart, lab work & pertinent test results  History of Anesthesia Complications (+) PONV  Airway Mallampati: I TM Distance: >3 FB Neck ROM: Full    Dental   Pulmonary Current Smoker,          Cardiovascular     Neuro/Psych    GI/Hepatic GERD-  Medicated and Controlled,  Endo/Other  diabetes, Poorly Controlled, Type 2, Insulin Dependent  Renal/GU      Musculoskeletal   Abdominal   Peds  Hematology   Anesthesia Other Findings   Reproductive/Obstetrics                           Anesthesia Physical Anesthesia Plan  ASA: III  Anesthesia Plan: General   Post-op Pain Management:    Induction: Intravenous  Airway Management Planned: LMA  Additional Equipment:   Intra-op Plan:   Post-operative Plan: Extubation in OR  Informed Consent: I have reviewed the patients History and Physical, chart, labs and discussed the procedure including the risks, benefits and alternatives for the proposed anesthesia with the patient or authorized representative who has indicated his/her understanding and acceptance.     Plan Discussed with: CRNA and Surgeon  Anesthesia Plan Comments:         Anesthesia Quick Evaluation

## 2013-12-20 NOTE — Discharge Instructions (Addendum)

## 2013-12-20 NOTE — Transfer of Care (Signed)
Immediate Anesthesia Transfer of Care Note  Patient: [name]  Procedure(s) Performed: Procedure(s): RIGHT THUMB TRIGGER  RELEASE  (Right)  Patient Location: PACU  Anesthesia Type:General  Level of Consciousness: awake and patient cooperative  Airway & Oxygen Therapy: Patient Spontanous Breathing and Patient connected to face mask oxygen  Post-op Assessment: Report given to PACU RN and Post -op Vital signs reviewed and stable  Post vital signs: Reviewed and stable  Complications: No apparent anesthesia complications

## 2013-12-20 NOTE — Anesthesia Procedure Notes (Signed)
Procedure Name: LMA Insertion Date/Time: 12/20/2013 8:38 AM Performed by: BLOCKER, TIMOTHY Pre-anesthesia Checklist: Patient identified, Emergency Drugs available, Suction available and Patient being monitored Patient Re-evaluated:Patient Re-evaluated prior to inductionOxygen Delivery Method: Circle System Utilized Preoxygenation: Pre-oxygenation with 100% oxygen Intubation Type: IV induction Ventilation: Mask ventilation without difficulty LMA: LMA inserted LMA Size: 4.0 Number of attempts: 1 Airway Equipment and Method: bite block Placement Confirmation: positive ETCO2 Tube secured with: Tape Dental Injury: Teeth and Oropharynx as per pre-operative assessment

## 2013-12-20 NOTE — Anesthesia Postprocedure Evaluation (Signed)
Anesthesia Post Note  Patient: [name]  Procedure(s) Performed: Procedure(s) (LRB): RIGHT THUMB TRIGGER  RELEASE  (Right)  Anesthesia type: general  Patient location: PACU  Post pain: Pain level controlled  Post assessment: Patient's Cardiovascular Status Stable  Last Vitals:  Filed Vitals:   12/20/13 1006  BP: 122/63  Pulse: 67  Temp: 36.4 C  Resp: 16    Post vital signs: Reviewed and stable  Level of consciousness: sedated  Complications: No apparent anesthesia complications

## 2013-12-20 NOTE — Progress Notes (Signed)
Dr Conrad [place] told pt to cut insulin pump off at 0739 and done

## 2013-12-20 NOTE — Brief Op Note (Signed)
12/20/2013  9:04 AM  PATIENT:  [name]  53 y.o. female  PRE-OPERATIVE DIAGNOSIS:  RIGHT THUMB TRIGGER DIGIT  POST-OPERATIVE DIAGNOSIS:  RIGHT THUMB TRIGGER DIGIT  PROCEDURE:  Procedure(s): RIGHT THUMB TRIGGER  RELEASE  (Right)  SURGEON:  Surgeon(s) and Role:    * Tennis Must, MD - Primary  PHYSICIAN ASSISTANT:   ASSISTANTS: none   ANESTHESIA:   general  EBL:     BLOOD ADMINISTERED:none  DRAINS: none   LOCAL MEDICATIONS USED:  MARCAINE     SPECIMEN:  No Specimen  DISPOSITION OF SPECIMEN:  N/A  COUNTS:  YES  TOURNIQUET:   Total Tourniquet Time Documented: Upper Arm (Right) - 14 minutes Total: Upper Arm (Right) - 14 minutes   DICTATION: .Other Dictation: Dictation Number [id number]  PLAN OF CARE: Discharge to home after PACU  PATIENT DISPOSITION:  PACU - hemodynamically stable.

## 2013-12-20 NOTE — Op Note (Signed)
[id number] 

## 2013-12-20 NOTE — H&P (Signed)
[name] is an 53 y.o. female.   Chief Complaint: right trigger thumb HPI: 53 yo lhd female with triggering right thumb x 6 months.  Continued triggering after two injections.  This is bothersome to her.  She wishes to have a right thumb trigger release.    Past Medical History  Diagnosis Date  . Drug addiction clean for 18 months    cocaine  . Diabetes mellitus   . Diverticulosis   . Internal hemorrhoids   . Depression   . Diabetic neuropathy   . GERD (gastroesophageal reflux disease)   . Fibromyalgia   . Hyperlipidemia   . Rheumatoid arthritis(714.0)   . Anxiety   . Arthritis   . PONV (postoperative nausea and vomiting)   . Wears glasses   . Full dentures   . COPD (chronic obstructive pulmonary disease)     Past Surgical History  Procedure Laterality Date  . Cataract extraction  2009    bilateral  . Appendectomy    . Cesarean section    . Hernia repair      incisional  . Rotatator cuff repair  2001    left  . Dental surgery  2011    all teeth removed  . Tubal ligation  2002  . Tonsillectomy    . Knee arthroscopy  Right  . Knee arthroscopy      lt and rt-2014  . Eye surgery  08,09    both cataracts    Family History  Problem Relation Age of Onset  . Breast cancer Mother   . Breast cancer Maternal Aunt   . Colon cancer Maternal Grandfather   . Diabetes Paternal Grandmother   . Heart failure Maternal Grandmother     CHF   Social History:  reports that she has been smoking Cigarettes.  She has been smoking about 0.50 packs per day. She has never used smokeless tobacco. She reports that she does not drink alcohol or use illicit drugs.  Allergies:  Allergies  Allergen Reactions  . Penicillins Rash    Childhood allergy  . Sulfa Antibiotics Hives and Shortness Of Breath  . Statins Other (See Comments)    Reaction: muscle pain  . Codeine Nausea Only    Can tolerate with pre-med    Medications Prior to Admission  Medication Sig Dispense Refill  .  insulin lispro (HUMALOG) 100 UNIT/ML injection Inject into the skin every morning. Insulin pump-base rate-      . albuterol (PROVENTIL HFA;VENTOLIN HFA) 108 (90 BASE) MCG/ACT inhaler Inhale into the lungs every 6 (six) hours as needed for wheezing or shortness of breath.      . fluticasone-salmeterol (ADVAIR HFA) 115-21 MCG/ACT inhaler Inhale 2 puffs into the lungs as needed.      . promethazine (PHENERGAN) 25 MG tablet Take 25 mg by mouth every 6 (six) hours as needed. For nausea      . traMADol (ULTRAM) 50 MG tablet Take 50-100 mg by mouth every 6 (six) hours as needed.        Results for orders placed during the hospital encounter of 12/20/13 (from the past 48 hour(s))  POCT I-STAT, CHEM 8     Status: Abnormal   Collection Time    12/20/13  7:19 AM      Result Value Ref Range   Sodium 140  137 - 147 mEq/L   Potassium 3.9  3.7 - 5.3 mEq/L   Chloride 109  96 - 112 mEq/L   BUN 13  6 -  23 mg/dL   Creatinine, Ser 0.90  0.50 - 1.10 mg/dL   Glucose, Bld 155 (*) 70 - 99 mg/dL   Calcium, Ion 1.35 (*) 1.12 - 1.23 mmol/L   TCO2 21  0 - 100 mmol/L   Hemoglobin 16.3 (*) 12.0 - 15.0 g/dL   HCT 48.0 (*) 36.0 - 46.0 %    No results found.   A comprehensive review of systems was negative except for: Eyes: positive for contacts/glasses  Blood pressure 121/72, pulse 71, temperature 98.4 F (36.9 C), temperature source Oral, resp. rate 22, height 4\' 10"  (1.473 m), weight 57.153 kg (126 lb), SpO2 100.00%.  General appearance: alert, cooperative and appears stated age Head: Normocephalic, without obvious abnormality, atraumatic Neck: supple, symmetrical, trachea midline Resp: clear to auscultation bilaterally Cardio: regular rate and rhythm GI: non tender Extremities: intact sensation and capillary refill all digits.  +epl/fpl/io.  skin intact.  triggering right thumb. Pulses: 2+ and symmetric Skin: Skin color, texture, turgor normal. No rashes or lesions Neurologic: Grossly  normal Incision/Wound: None  Assessment/Plan Right thumb trigger digit.  Non operative and operative treatment options were discussed with the patient and patient wishes to proceed with operative treatment. Risks, benefits, and alternatives of surgery were discussed and the patient agrees with the plan of care.   KUZMA,KEVIN R 12/20/2013, 8:27 AM

## 2013-12-21 ENCOUNTER — Encounter (HOSPITAL_BASED_OUTPATIENT_CLINIC_OR_DEPARTMENT_OTHER): Payer: Self-pay | Admitting: Orthopedic Surgery

## 2013-12-21 NOTE — Op Note (Signed)
[name], [name]              ACCOUNT NO.:  [id number]  MEDICAL RECORD NO.:  [id number]  LOCATION:                                 FACILITY:  PHYSICIAN:  Leanora Cover, MD        DATE OF BIRTH:  1960-08-01  DATE OF PROCEDURE:  12/20/2013 DATE OF DISCHARGE:                              OPERATIVE REPORT   PREOPERATIVE DIAGNOSIS:  Right thumb trigger digit.  POSTOPERATIVE DIAGNOSIS:  Right thumb trigger digit with annular ligament cyst.  PROCEDURE:  Right thumb trigger digit release.  SURGEON:  Leanora Cover, MD  ASSISTANT:  None.  ANESTHESIA:  General.  IV FLUIDS:  Per Anesthesia flow sheet.  ESTIMATED BLOOD LOSS:  Minimal.  COMPLICATIONS:  None.  SPECIMENS:  None.  TOURNIQUET TIME:  14 minutes.  DISPOSITION:  Stable to PACU.  INDICATIONS:  [name] is a 53 year old female who has had triggering of pain in the right thumb for approximately 6 months.  She has had this injected twice without relief.  She wished to have the trigger digit release.  Risks, benefits, and alternatives of surgery were discussed including the risk of blood loss; infection; damage to nerves, vessels, tendons, ligaments, bone; failure of surgery; need for additional surgery; complications with wound healing; continued pain; continued triggering.  She voiced understanding of these risks and elected to proceed.  DESCRIPTION OF PROCEDURE:  After being identified preoperatively by myself, the patient and I agreed upon procedure and site of procedure. Surgical site was marked.  The risks, benefits, and alternatives of surgery were reviewed and she wished to proceed.  Surgical consent had been signed.  She was given IV vancomycin as preoperative antibiotic prophylaxis due to penicillin allergy.  She was transferred to the operating room and placed on the operating room table in a supine position with the right upper extremity on an arm board.  General anesthesia was induced by Anesthesiology.   Right upper extremity was prepped and draped in normal sterile orthopedic fashion.  Surgical pause performed between surgeons, Anesthesia, and operating staff, and all were in agreement as to the patient, procedure, and site of procedure. Tourniquet at the proximal aspect of the extremity was inflated to 250 mmHg after exsanguination of the limb with Esmarch bandage.  Incision was made at the proximal flexion crease of the thumb.  This incision was made through the skin only.  This was carried into subcutaneous tissues by spreading technique.  The radial and ulnar digital nerves were identified and protected throughout the case.  The A1 pulley was identified.  It was sharply incised.  The scissors were used to divide in its entirety.  There was an annular ligament cyst noted on the pulley which was also excised.  The scissors were used to spread proximally, but no cutting was performed proximally.  The tendon was directly visualized.  It was brought out through the wound.  This flexed the IP joint down.  The joint was able to be placed through range of motion without any triggering.  There was some crepitance in the IP joint itself.  The wound was copiously irrigated with sterile saline.  It was closed with 4-0 nylon in  a horizontal mattress fashion.  It was injected with 7 mL of 0.25% plain Marcaine to aid in postoperative analgesia.  It was then dressed with sterile Xeroform, 4x4s, and wrapped with Kling and Coban dressing lightly.  Tourniquet was deflated at 14 minutes. Fingertips were pink with brisk capillary refill after deflation of the tourniquet.  The operative drapes were broken down and the patient was awoken from anesthesia safely.  She was transferred back to stretcher and taken to the PACU in stable condition.  I will see her back in the office in 1 week for postoperative followup.  I will give her Norco 5/325, 1 to 2 p.o. q.6 hours p.r.n. pain, dispensed #30.     Leanora Cover, MD     KK/MEDQ  D:  12/20/2013  T:  12/20/2013  Job:  [id number]

## 2013-12-23 DIAGNOSIS — R894 Abnormal immunological findings in specimens from other organs, systems and tissues: Secondary | ICD-10-CM | POA: Diagnosis not present

## 2013-12-23 DIAGNOSIS — IMO0001 Reserved for inherently not codable concepts without codable children: Secondary | ICD-10-CM | POA: Diagnosis not present

## 2013-12-23 DIAGNOSIS — M1990 Unspecified osteoarthritis, unspecified site: Secondary | ICD-10-CM | POA: Diagnosis not present

## 2013-12-23 DIAGNOSIS — M19079 Primary osteoarthritis, unspecified ankle and foot: Secondary | ICD-10-CM | POA: Diagnosis not present

## 2013-12-23 DIAGNOSIS — M2550 Pain in unspecified joint: Secondary | ICD-10-CM | POA: Diagnosis not present

## 2013-12-27 ENCOUNTER — Encounter: Payer: Medicare Other | Attending: Internal Medicine | Admitting: *Deleted

## 2013-12-27 DIAGNOSIS — Z713 Dietary counseling and surveillance: Secondary | ICD-10-CM | POA: Diagnosis not present

## 2013-12-27 DIAGNOSIS — E119 Type 2 diabetes mellitus without complications: Secondary | ICD-10-CM | POA: Diagnosis not present

## 2013-12-27 DIAGNOSIS — M19049 Primary osteoarthritis, unspecified hand: Secondary | ICD-10-CM | POA: Insufficient documentation

## 2013-12-27 DIAGNOSIS — Z794 Long term (current) use of insulin: Secondary | ICD-10-CM | POA: Diagnosis not present

## 2013-12-27 DIAGNOSIS — IMO0002 Reserved for concepts with insufficient information to code with codable children: Secondary | ICD-10-CM

## 2013-12-27 DIAGNOSIS — E1065 Type 1 diabetes mellitus with hyperglycemia: Secondary | ICD-10-CM

## 2013-12-29 NOTE — Progress Notes (Signed)
  Pump Follow Up Progress Note on 12/27/13  Orders received from MD giving me permission to make insulin pump adjustments for the following patient.  Today's visit was to review [facility] reports for BG control assessment Patient recently had hand surgery so we planned for me to assist in changing out her infusion set and reservoir, but patient had been able to do that this AM prior to this appointment.     Hypoglycemia Hyperglycemia Comments  Overnight Period:      Pre-Meal:    Breakfast  YES BASAL    Lunch  YES BASAL   Supper  YES BASAL   Post-Meal: Breakfast      Lunch      Supper     Bedtime:       Comments: Average BG for past 2 weeks on the pump is 185 +/- 67 mg/dl. Infrequent hypoglycemia.  Gradual rise in BG between meals throughout the day so plan to increase Basal Rates by 10% for all 24 hours. Total Daily Dose is up to 65 units so taught patient to increase her Reservoir fill to 280 units to provide for this increase in Basal Rates today. Recommend increase in Basal Rates by 10%, see below  NOTE: Target Range per Dr. Buddy Duty to be 100-100. This has now been accomplished and changed in her pump. Pump Settings: Date: Current Date: 12/27/13  Changes in bold print   Basal Rate: Carb Ratio Sensitivity  Basal Rate: Carb Ratio Sensitivity   MN: 1.40 5.0 20 MN: 1.70  (+) 5.0 20  10 A 1.80   3PM 2.00  (+)    6 P 1.70   6 PM 2.00  (+)                                         Plan: patient to continue to use Bolus Wizard and change out pump every 3 days. She is to call me if BG below 70 or above 300 mg/dl. She will follow up with Dr. Buddy Duty and then with me in about a month to continue pump education with [employer].

## 2014-01-04 ENCOUNTER — Other Ambulatory Visit: Payer: Self-pay | Admitting: Gastroenterology

## 2014-01-04 DIAGNOSIS — D131 Benign neoplasm of stomach: Secondary | ICD-10-CM | POA: Diagnosis not present

## 2014-01-04 DIAGNOSIS — R109 Unspecified abdominal pain: Secondary | ICD-10-CM | POA: Diagnosis not present

## 2014-01-04 DIAGNOSIS — K229 Disease of esophagus, unspecified: Secondary | ICD-10-CM | POA: Diagnosis not present

## 2014-01-04 DIAGNOSIS — K921 Melena: Secondary | ICD-10-CM | POA: Diagnosis not present

## 2014-01-04 DIAGNOSIS — K209 Esophagitis, unspecified without bleeding: Secondary | ICD-10-CM | POA: Diagnosis not present

## 2014-01-25 DIAGNOSIS — R894 Abnormal immunological findings in specimens from other organs, systems and tissues: Secondary | ICD-10-CM | POA: Diagnosis not present

## 2014-01-25 DIAGNOSIS — R5381 Other malaise: Secondary | ICD-10-CM | POA: Diagnosis not present

## 2014-01-25 DIAGNOSIS — M1990 Unspecified osteoarthritis, unspecified site: Secondary | ICD-10-CM | POA: Diagnosis not present

## 2014-01-25 DIAGNOSIS — IMO0001 Reserved for inherently not codable concepts without codable children: Secondary | ICD-10-CM | POA: Diagnosis not present

## 2014-01-25 DIAGNOSIS — M2550 Pain in unspecified joint: Secondary | ICD-10-CM | POA: Diagnosis not present

## 2014-01-25 DIAGNOSIS — R5383 Other fatigue: Secondary | ICD-10-CM | POA: Diagnosis not present

## 2014-02-01 DIAGNOSIS — M7710 Lateral epicondylitis, unspecified elbow: Secondary | ICD-10-CM | POA: Diagnosis not present

## 2014-02-01 DIAGNOSIS — M6530 Trigger finger, unspecified finger: Secondary | ICD-10-CM | POA: Diagnosis not present

## 2014-02-08 DIAGNOSIS — M7710 Lateral epicondylitis, unspecified elbow: Secondary | ICD-10-CM | POA: Diagnosis not present

## 2014-02-08 DIAGNOSIS — M6530 Trigger finger, unspecified finger: Secondary | ICD-10-CM | POA: Diagnosis not present

## 2014-02-10 DIAGNOSIS — M6530 Trigger finger, unspecified finger: Secondary | ICD-10-CM | POA: Diagnosis not present

## 2014-02-10 DIAGNOSIS — M7710 Lateral epicondylitis, unspecified elbow: Secondary | ICD-10-CM | POA: Diagnosis not present

## 2014-02-15 ENCOUNTER — Other Ambulatory Visit: Payer: Self-pay | Admitting: Gastroenterology

## 2014-02-15 DIAGNOSIS — R109 Unspecified abdominal pain: Secondary | ICD-10-CM | POA: Diagnosis not present

## 2014-02-15 DIAGNOSIS — R194 Change in bowel habit: Secondary | ICD-10-CM

## 2014-02-15 DIAGNOSIS — R1032 Left lower quadrant pain: Secondary | ICD-10-CM

## 2014-02-17 ENCOUNTER — Emergency Department (INDEPENDENT_AMBULATORY_CARE_PROVIDER_SITE_OTHER): Payer: Medicare Other

## 2014-02-17 ENCOUNTER — Emergency Department (INDEPENDENT_AMBULATORY_CARE_PROVIDER_SITE_OTHER)
Admission: EM | Admit: 2014-02-17 | Discharge: 2014-02-17 | Disposition: A | Discharge status: Home / Self Care | Payer: Medicare Other | Source: Home / Self Care | Attending: Family Medicine | Admitting: Family Medicine

## 2014-02-17 ENCOUNTER — Encounter (HOSPITAL_COMMUNITY): Payer: Self-pay | Admitting: Family Medicine

## 2014-02-17 ENCOUNTER — Ambulatory Visit
Admission: RE | Admit: 2014-02-17 | Discharge: 2014-02-17 | Disposition: A | Discharge status: Home / Self Care | Payer: Medicare Other | Source: Ambulatory Visit | Attending: Gastroenterology | Admitting: Gastroenterology

## 2014-02-17 DIAGNOSIS — S93609A Unspecified sprain of unspecified foot, initial encounter: Secondary | ICD-10-CM

## 2014-02-17 DIAGNOSIS — S99919A Unspecified injury of unspecified ankle, initial encounter: Secondary | ICD-10-CM | POA: Diagnosis not present

## 2014-02-17 DIAGNOSIS — R1032 Left lower quadrant pain: Secondary | ICD-10-CM

## 2014-02-17 DIAGNOSIS — L0390 Cellulitis, unspecified: Secondary | ICD-10-CM

## 2014-02-17 DIAGNOSIS — R194 Change in bowel habit: Secondary | ICD-10-CM

## 2014-02-17 DIAGNOSIS — M79609 Pain in unspecified limb: Secondary | ICD-10-CM | POA: Diagnosis not present

## 2014-02-17 DIAGNOSIS — K5730 Diverticulosis of large intestine without perforation or abscess without bleeding: Secondary | ICD-10-CM | POA: Diagnosis not present

## 2014-02-17 DIAGNOSIS — S8990XA Unspecified injury of unspecified lower leg, initial encounter: Secondary | ICD-10-CM | POA: Diagnosis not present

## 2014-02-17 DIAGNOSIS — L0291 Cutaneous abscess, unspecified: Secondary | ICD-10-CM

## 2014-02-17 DIAGNOSIS — S93509A Unspecified sprain of unspecified toe(s), initial encounter: Secondary | ICD-10-CM

## 2014-02-17 MED ORDER — IOHEXOL 300 MG/ML  SOLN
100.0000 mL | Freq: Once | INTRAMUSCULAR | Status: AC | PRN
  Administered 2014-02-17: 100 mL via INTRAVENOUS

## 2014-02-17 MED ORDER — DOXYCYCLINE HYCLATE 100 MG PO TABS: 100.0000 mg | ORAL_TABLET | Freq: Two times a day (BID) | ORAL | Status: DC

## 2014-02-17 NOTE — Discharge Instructions (Signed)
You did not break your toes Please buddy tape the toes adn wear your post op shoe.  Remember to perform range of motion exercises 2-3 times daily  Let pain be your guide with regards to returning to normal function Please come back if you are not any better in 2 weeks as some small fractures take that tlong to show up on xray Please apply warm compresses and antibiotic ointment to your left thigh.  When the area scabs over you will no longer need the ointment. Please start the antibiotics only if the area appears to be getting worse

## 2014-02-17 NOTE — ED Notes (Addendum)
States she injured foot last PM, hurts worse today.  Echymosis noted distal metatarsal area; denies other injury. NAD. Also concerned about pain and swelling left groin

## 2014-02-17 NOTE — ED Provider Notes (Signed)
CSN: [id number]     Arrival date & time 02/17/14  1123 History   First MD Initiated Contact with Patient 02/17/14 1201     Chief Complaint  Patient presents with  . Foot Injury   (Consider location/radiation/quality/duration/timing/severity/associated sxs/prior Treatment) HPI  Hit toe when knee gave out around 21:00 last night. Hit piece of furniture. And felt a pop in her 4th adn 5th toes. Pt w/ baseline diabetic neuropathy. Immediately painful and swollen. Ice w/o much benefit. Able to ambulate but w/ significant pain. Poor balance at baseline. Uses walker typically but not last night. Insulin pump in place. Pain is constant and throbbing.   L thigh abscess. Started 14 days ago. Prone to infections per pt. Painful. Warm compresses w/o relief. No drainage  Past Medical History  Diagnosis Date  . Drug addiction clean for 18 months    cocaine  . Diabetes mellitus   . Diverticulosis   . Internal hemorrhoids   . Depression   . Diabetic neuropathy   . GERD (gastroesophageal reflux disease)   . Fibromyalgia   . Hyperlipidemia   . Rheumatoid arthritis(714.0)   . Anxiety   . Arthritis   . PONV (postoperative nausea and vomiting)   . Wears glasses   . Full dentures   . COPD (chronic obstructive pulmonary disease)    Past Surgical History  Procedure Laterality Date  . Cataract extraction  2009    bilateral  . Appendectomy    . Cesarean section    . Hernia repair      incisional  . Rotatator cuff repair  2001    left  . Dental surgery  2011    all teeth removed  . Tubal ligation  2002  . Tonsillectomy    . Knee arthroscopy  Right  . Knee arthroscopy      lt and rt-2014  . Eye surgery  08,09    both cataracts  . Trigger finger release Right 12/20/2013    Procedure: RIGHT THUMB TRIGGER  RELEASE ;  Surgeon: Tennis Must, MD;  Location: [place];  Service: Orthopedics;  Laterality: Right;   Family History  Problem Relation Age of Onset  . Breast cancer  Mother   . Breast cancer Maternal Aunt   . Colon cancer Maternal Grandfather   . Diabetes Paternal Grandmother   . Heart failure Maternal Grandmother     CHF   History  Substance Use Topics  . Smoking status: Current Some Day Smoker -- 0.50 packs/day    Types: Cigarettes  . Smokeless tobacco: Never Used  . Alcohol Use: No   OB History   Grav Para Term Preterm Abortions TAB SAB Ect Mult Living                 Review of Systems Per HPI with all other pertinent systems negative.   Allergies  Penicillins; Sulfa antibiotics; Statins; and Codeine  Home Medications   Prior to Admission medications   Medication Sig Start Date End Date Taking? Authorizing Provider  albuterol (PROVENTIL HFA;VENTOLIN HFA) 108 (90 BASE) MCG/ACT inhaler Inhale into the lungs every 6 (six) hours as needed for wheezing or shortness of breath.    Historical Provider, MD  doxycycline (VIBRA-TABS) 100 MG tablet Take 1 tablet (100 mg total) by mouth 2 (two) times daily. 02/17/14   Waldemar Dickens, MD  fluticasone-salmeterol (ADVAIR HFA) [phone] MCG/ACT inhaler Inhale 2 puffs into the lungs as needed.    Historical Provider, MD  HYDROcodone-acetaminophen (  NORCO) 5-325 MG per tablet 1-2 tabs po q6 hours prn pain 12/20/13   Leanora Cover, MD  insulin lispro (HUMALOG) 100 UNIT/ML injection Inject into the skin every morning. Insulin pump-base rate-    Historical Provider, MD  promethazine (PHENERGAN) 25 MG tablet Take 25 mg by mouth every 6 (six) hours as needed. For nausea    Historical Provider, MD   BP 148/74  Pulse 82  Temp(Src) 98 F (36.7 C) (Oral)  Resp 18  SpO2 99% Physical Exam  Constitutional: She is oriented to person, place, and time. She appears well-developed and well-nourished. No distress.  HENT:  Head: Normocephalic and atraumatic.  Eyes: EOM are normal. Pupils are equal, round, and reactive to light.  Neck: Normal range of motion.  Cardiovascular: Normal rate, normal heart sounds and intact  distal pulses.   Pulmonary/Chest: Effort normal and breath sounds normal.  Abdominal: Soft. Bowel sounds are normal.  Musculoskeletal:  L 4-5th toe sweling w/ ecchymosis. Minimal ROM due to pain  Neurological: She is alert and oriented to person, place, and time.  Skin: Skin is warm. She is not diaphoretic.  L proximal medial thigh w/ area of induration (2cm) and fluctuance.   Psychiatric: She has a normal mood and affect. Her behavior is normal. Judgment and thought content normal.    ED Course  INCISION AND DRAINAGE Date/Time: 02/17/2014 1:19 PM Performed by: Marily Memos, DAVID J Authorized by: Marily Memos, DAVID J Consent: Verbal consent obtained. Consent given by: patient Patient identity confirmed: verbally with patient Type: abscess Body area: lower extremity Location details: left leg Anesthesia method: ethyl chloride. Patient sedated: no Scalpel size: 11 Incision type: single straight Complexity: simple Drainage: purulent Drainage amount: scant Wound treatment: wound left open Comments: Minimal surounding induration   (including critical care time) Labs Review Labs Reviewed - No data to display  Imaging Review Dg Foot Complete Left  02/17/2014   CLINICAL DATA:  Injury left foot yesterday. Pain, worst about the fourth toe.  EXAM: LEFT FOOT - COMPLETE 3+ VIEW  COMPARISON:  None.  FINDINGS: No acute bony or joint abnormality is identified. No notable degenerative change. Small plantar calcaneal spur is noted.  IMPRESSION: No acute abnormality.   Electronically Signed   By: Inge Rise M.D.   On: 02/17/2014 12:41     MDM   1. Toe sprain, initial encounter   2. Abscess    Xray neg for fracture. Toe sprain. Buddy tape. Post op shoe. Daily ROM. Ice for 24-48 hrs then heat. Return to activity as able  Abscess: drained as above. SMall and minimal surounding evidence of infection. No cellulitis. Local topical ABX ointment and warm compresses. Only start doxy if becoming  worse (allergies to PCN, Keflex, and sulfa). Wound Cx wsent    Waldemar Dickens, MD 02/17/14 1324

## 2014-02-20 LAB — WOUND CULTURE: Culture: NO GROWTH

## 2014-02-28 DIAGNOSIS — R109 Unspecified abdominal pain: Secondary | ICD-10-CM | POA: Diagnosis not present

## 2014-02-28 DIAGNOSIS — K5730 Diverticulosis of large intestine without perforation or abscess without bleeding: Secondary | ICD-10-CM | POA: Diagnosis not present

## 2014-02-28 DIAGNOSIS — K648 Other hemorrhoids: Secondary | ICD-10-CM | POA: Diagnosis not present

## 2014-02-28 DIAGNOSIS — K5900 Constipation, unspecified: Secondary | ICD-10-CM | POA: Diagnosis not present

## 2014-03-01 DIAGNOSIS — Z23 Encounter for immunization: Secondary | ICD-10-CM | POA: Diagnosis not present

## 2014-03-01 DIAGNOSIS — L259 Unspecified contact dermatitis, unspecified cause: Secondary | ICD-10-CM | POA: Diagnosis not present

## 2014-03-01 DIAGNOSIS — J449 Chronic obstructive pulmonary disease, unspecified: Secondary | ICD-10-CM | POA: Diagnosis not present

## 2014-03-01 DIAGNOSIS — H6120 Impacted cerumen, unspecified ear: Secondary | ICD-10-CM | POA: Diagnosis not present

## 2014-03-02 ENCOUNTER — Encounter (HOSPITAL_COMMUNITY): Payer: Self-pay | Admitting: Emergency Medicine

## 2014-03-02 ENCOUNTER — Emergency Department (HOSPITAL_COMMUNITY)
Admission: EM | Admit: 2014-03-02 | Discharge: 2014-03-02 | Disposition: A | Discharge status: Home / Self Care | Payer: Medicare Other | Attending: Emergency Medicine | Admitting: Emergency Medicine

## 2014-03-02 DIAGNOSIS — F411 Generalized anxiety disorder: Secondary | ICD-10-CM | POA: Diagnosis not present

## 2014-03-02 DIAGNOSIS — Z794 Long term (current) use of insulin: Secondary | ICD-10-CM | POA: Insufficient documentation

## 2014-03-02 DIAGNOSIS — E1149 Type 2 diabetes mellitus with other diabetic neurological complication: Secondary | ICD-10-CM | POA: Insufficient documentation

## 2014-03-02 DIAGNOSIS — F17200 Nicotine dependence, unspecified, uncomplicated: Secondary | ICD-10-CM | POA: Insufficient documentation

## 2014-03-02 DIAGNOSIS — M545 Low back pain, unspecified: Secondary | ICD-10-CM | POA: Diagnosis not present

## 2014-03-02 DIAGNOSIS — M129 Arthropathy, unspecified: Secondary | ICD-10-CM | POA: Diagnosis not present

## 2014-03-02 DIAGNOSIS — Z9089 Acquired absence of other organs: Secondary | ICD-10-CM | POA: Diagnosis not present

## 2014-03-02 DIAGNOSIS — J4489 Other specified chronic obstructive pulmonary disease: Secondary | ICD-10-CM | POA: Insufficient documentation

## 2014-03-02 DIAGNOSIS — Z792 Long term (current) use of antibiotics: Secondary | ICD-10-CM | POA: Insufficient documentation

## 2014-03-02 DIAGNOSIS — M79604 Pain in right leg: Secondary | ICD-10-CM | POA: Diagnosis present

## 2014-03-02 DIAGNOSIS — IMO0002 Reserved for concepts with insufficient information to code with codable children: Secondary | ICD-10-CM | POA: Diagnosis not present

## 2014-03-02 DIAGNOSIS — Z862 Personal history of diseases of the blood and blood-forming organs and certain disorders involving the immune mechanism: Secondary | ICD-10-CM | POA: Diagnosis not present

## 2014-03-02 DIAGNOSIS — R52 Pain, unspecified: Secondary | ICD-10-CM | POA: Diagnosis not present

## 2014-03-02 DIAGNOSIS — K219 Gastro-esophageal reflux disease without esophagitis: Secondary | ICD-10-CM | POA: Diagnosis not present

## 2014-03-02 DIAGNOSIS — Z789 Other specified health status: Secondary | ICD-10-CM | POA: Diagnosis not present

## 2014-03-02 DIAGNOSIS — E1142 Type 2 diabetes mellitus with diabetic polyneuropathy: Secondary | ICD-10-CM | POA: Diagnosis not present

## 2014-03-02 DIAGNOSIS — F3289 Other specified depressive episodes: Secondary | ICD-10-CM | POA: Diagnosis not present

## 2014-03-02 DIAGNOSIS — Z79899 Other long term (current) drug therapy: Secondary | ICD-10-CM | POA: Insufficient documentation

## 2014-03-02 DIAGNOSIS — Z791 Long term (current) use of non-steroidal anti-inflammatories (NSAID): Secondary | ICD-10-CM | POA: Insufficient documentation

## 2014-03-02 DIAGNOSIS — Z8639 Personal history of other endocrine, nutritional and metabolic disease: Secondary | ICD-10-CM | POA: Diagnosis not present

## 2014-03-02 DIAGNOSIS — Z9851 Tubal ligation status: Secondary | ICD-10-CM | POA: Insufficient documentation

## 2014-03-02 DIAGNOSIS — F329 Major depressive disorder, single episode, unspecified: Secondary | ICD-10-CM | POA: Insufficient documentation

## 2014-03-02 DIAGNOSIS — Z8719 Personal history of other diseases of the digestive system: Secondary | ICD-10-CM | POA: Insufficient documentation

## 2014-03-02 DIAGNOSIS — J449 Chronic obstructive pulmonary disease, unspecified: Secondary | ICD-10-CM | POA: Diagnosis not present

## 2014-03-02 DIAGNOSIS — Z880 Allergy status to penicillin: Secondary | ICD-10-CM | POA: Diagnosis not present

## 2014-03-02 DIAGNOSIS — R109 Unspecified abdominal pain: Secondary | ICD-10-CM | POA: Insufficient documentation

## 2014-03-02 LAB — COMPREHENSIVE METABOLIC PANEL WITH GFR
ALT: 20 U/L (ref 0–35)
AST: 20 U/L (ref 0–37)
Albumin: 3.7 g/dL (ref 3.5–5.2)
Alkaline Phosphatase: 80 U/L (ref 39–117)
Anion gap: 12 (ref 5–15)
BUN: 14 mg/dL (ref 6–23)
CO2: 23 meq/L (ref 19–32)
Calcium: 8.9 mg/dL (ref 8.4–10.5)
Chloride: 106 meq/L (ref 96–112)
Creatinine, Ser: 0.91 mg/dL (ref 0.50–1.10)
GFR calc Af Amer: 81 mL/min — ABNORMAL LOW
GFR calc non Af Amer: 70 mL/min — ABNORMAL LOW
Glucose, Bld: 258 mg/dL — ABNORMAL HIGH (ref 70–99)
Potassium: 4.3 meq/L (ref 3.7–5.3)
Sodium: 141 meq/L (ref 137–147)
Total Bilirubin: <0.2 mg/dL — ABNORMAL LOW (ref 0.3–1.2)
Total Protein: 6.7 g/dL (ref 6.0–8.3)

## 2014-03-02 LAB — URINALYSIS, ROUTINE W REFLEX MICROSCOPIC
Bilirubin Urine: NEGATIVE
GLUCOSE, UA: 500 mg/dL — AB
HGB URINE DIPSTICK: NEGATIVE
Ketones, ur: NEGATIVE mg/dL
LEUKOCYTES UA: NEGATIVE
Nitrite: NEGATIVE
PH: 5 (ref 5.0–8.0)
PROTEIN: NEGATIVE mg/dL
Specific Gravity, Urine: 1.026 (ref 1.005–1.030)
Urobilinogen, UA: 0.2 mg/dL (ref 0.0–1.0)

## 2014-03-02 LAB — CBC WITH DIFFERENTIAL/PLATELET
Basophils Absolute: 0 10*3/uL (ref 0.0–0.1)
Basophils Relative: 0 % (ref 0–1)
Eosinophils Absolute: 0.2 10*3/uL (ref 0.0–0.7)
Eosinophils Relative: 2 % (ref 0–5)
HCT: 39.1 % (ref 36.0–46.0)
Hemoglobin: 13.4 g/dL (ref 12.0–15.0)
Lymphocytes Relative: 37 % (ref 12–46)
Lymphs Abs: 2.9 10*3/uL (ref 0.7–4.0)
MCH: 30 pg (ref 26.0–34.0)
MCHC: 34.3 g/dL (ref 30.0–36.0)
MCV: 87.7 fL (ref 78.0–100.0)
Monocytes Absolute: 0.6 10*3/uL (ref 0.1–1.0)
Monocytes Relative: 7 % (ref 3–12)
Neutro Abs: 4.2 10*3/uL (ref 1.7–7.7)
Neutrophils Relative %: 54 % (ref 43–77)
Platelets: 179 10*3/uL (ref 150–400)
RBC: 4.46 MIL/uL (ref 3.87–5.11)
RDW: 13.9 % (ref 11.5–15.5)
WBC: 7.8 10*3/uL (ref 4.0–10.5)

## 2014-03-02 LAB — LIPASE, BLOOD: Lipase: 22 U/L (ref 11–59)

## 2014-03-02 MED ORDER — IBUPROFEN 600 MG PO TABS: 600.0000 mg | ORAL_TABLET | Freq: Four times a day (QID) | ORAL | Status: AC

## 2014-03-02 MED ORDER — CYCLOBENZAPRINE HCL 10 MG PO TABS: 10.0000 mg | ORAL_TABLET | Freq: Three times a day (TID) | ORAL | Status: DC | PRN

## 2014-03-02 NOTE — ED Provider Notes (Signed)
53 year old female presents with lower back pain with some radiation into the legs. This has been going on for several days, worse with standing and walking, better with lying down, denies any new numbness to her legs though she does have "neuropathy" according to her. On exam the patient has tenderness to palpation in the right paraspinal muscles down into the right upper buttock, there is increased pain with straight-leg raise on the right. She has normal sensation at baseline to the bilateral lower extremities, normal strength to passive range of motion. Does not appear to be in any distress.  No findings to suggest acute pathological disease / Cauda Equina - moving all extremities without numbness or weakness outside of normal.  I saw and evaluated the patient, reviewed the resident's note and I agree with the findings and plan.    Johnna Acosta, MD 03/03/14 [phone]

## 2014-03-02 NOTE — ED Provider Notes (Signed)
CSN: [id number]     Arrival date & time 03/02/14  1354 History   First MD Initiated Contact with Patient 03/02/14 1706     Chief Complaint  Patient presents with  . Back Pain  . Abdominal Pain     (Consider location/radiation/quality/duration/timing/severity/associated sxs/prior Treatment) HPI [name] 53 y.o. with a pmh of chronic pain (currently in the process of waiting for a pain doctor), DM who presents to the ED for low back pain. Pain started in the past 2 days. It was noted after doing some heavy cleaning in her home. Pain is localized to the lower back. It is sharp in character. It is worsened with any bending or twisting of her lower back. Some improvement when laying still. NO new LE weakness, numbness or tingling. She has baseline LE neuropathies, but these are unchanged from her baseline. No traumatic injury noted. No bowel or bladder incontinence. She also suffering from lower abdominal pain which has been ongonig for "several weeks" and recently had a colonoscopy 2 days ago as well. That pain is unchanged from when it started and the patient states "the abdominal pain is not related to the back pain". Denies any dysuria, hematuria, diarrhea, melena, blood in stool, N/V, vaginal bleeding or vaginal dc.   Past Medical History  Diagnosis Date  . Drug addiction clean for 18 months    cocaine  . Diabetes mellitus   . Diverticulosis   . Internal hemorrhoids   . Depression   . Diabetic neuropathy   . GERD (gastroesophageal reflux disease)   . Fibromyalgia   . Hyperlipidemia   . Rheumatoid arthritis(714.0)   . Anxiety   . Arthritis   . PONV (postoperative nausea and vomiting)   . Wears glasses   . Full dentures   . COPD (chronic obstructive pulmonary disease)    Past Surgical History  Procedure Laterality Date  . Cataract extraction  2009    bilateral  . Appendectomy    . Cesarean section    . Hernia repair      incisional  . Rotatator cuff repair  2001    left   . Dental surgery  2011    all teeth removed  . Tubal ligation  2002  . Tonsillectomy    . Knee arthroscopy  Right  . Knee arthroscopy      lt and rt-2014  . Eye surgery  08,09    both cataracts  . Trigger finger release Right 12/20/2013    Procedure: RIGHT THUMB TRIGGER  RELEASE ;  Surgeon: Tennis Must, MD;  Location: [place];  Service: Orthopedics;  Laterality: Right;   Family History  Problem Relation Age of Onset  . Breast cancer Mother   . Breast cancer Maternal Aunt   . Colon cancer Maternal Grandfather   . Diabetes Paternal Grandmother   . Heart failure Maternal Grandmother     CHF   History  Substance Use Topics  . Smoking status: Current Some Day Smoker -- 0.50 packs/day    Types: Cigarettes  . Smokeless tobacco: Never Used  . Alcohol Use: No   OB History   Grav Para Term Preterm Abortions TAB SAB Ect Mult Living                 Review of Systems  Musculoskeletal: Positive for back pain.  All other systems reviewed and are negative.     Allergies  Penicillins; Sulfa antibiotics; Statins; Codeine; and Keflet  Home Medications  Prior to Admission medications   Medication Sig Start Date End Date Taking? Authorizing Provider  albuterol (PROVENTIL HFA;VENTOLIN HFA) 108 (90 BASE) MCG/ACT inhaler Inhale into the lungs every 6 (six) hours as needed for wheezing or shortness of breath.   Yes Historical Provider, MD  aspirin 81 MG tablet Take 81 mg by mouth daily.   Yes Historical Provider, MD  fluticasone-salmeterol (ADVAIR HFA) 115-21 MCG/ACT inhaler Inhale 1 puff into the lungs daily.    Yes Historical Provider, MD  HYDROcodone-acetaminophen (NORCO/VICODIN) 5-325 MG per tablet Take 1-2 tablets by mouth every 6 (six) hours as needed for moderate pain.   Yes Historical Provider, MD  insulin lispro (HUMALOG) 100 UNIT/ML injection Inject into the skin every morning. Insulin pump-base rate-   Yes Historical Provider, MD  omeprazole (PRILOSEC) 20  MG capsule Take 20 mg by mouth daily.   Yes Historical Provider, MD  promethazine (PHENERGAN) 25 MG tablet Take 25 mg by mouth every 6 (six) hours as needed. For nausea   Yes Historical Provider, MD  traMADol (ULTRAM) 50 MG tablet Take 50 mg by mouth every 6 (six) hours as needed for moderate pain.   Yes Historical Provider, MD  cyclobenzaprine (FLEXERIL) 10 MG tablet Take 1 tablet (10 mg total) by mouth 3 (three) times daily as needed for muscle spasms. 03/02/14   Kelby Aline, MD  doxycycline (VIBRA-TABS) 100 MG tablet Take 1 tablet (100 mg total) by mouth 2 (two) times daily. 02/17/14   Waldemar Dickens, MD  ibuprofen (ADVIL,MOTRIN) 600 MG tablet Take 1 tablet (600 mg total) by mouth every 6 (six) hours. 03/02/14 03/04/14  Kelby Aline, MD   BP 106/83  Pulse 74  Temp(Src) 98 F (36.7 C) (Oral)  Resp 21  SpO2 99% Physical Exam  Constitutional: She is oriented to person, place, and time. She appears well-developed and well-nourished. No distress.  HENT:  Head: Normocephalic and atraumatic.  Right Ear: External ear normal.  Left Ear: External ear normal.  Eyes: Conjunctivae and EOM are normal. Right eye exhibits no discharge. Left eye exhibits no discharge.  Neck: Normal range of motion. Neck supple. No JVD present.  Cardiovascular: Normal rate, regular rhythm and normal heart sounds.  Exam reveals no gallop and no friction rub.   No murmur heard. Pulmonary/Chest: Effort normal and breath sounds normal. No stridor. No respiratory distress. She has no wheezes. She has no rales. She exhibits no tenderness.  Abdominal: Soft. Bowel sounds are normal. She exhibits no distension. There is no tenderness. There is no rebound and no guarding.  Musculoskeletal: Normal range of motion. She exhibits no edema.       Lumbar back: She exhibits tenderness (right lower lumbar ). She exhibits no bony tenderness.  Neurological: She is alert and oriented to person, place, and time. GCS eye subscore is 4. GCS  verbal subscore is 5. GCS motor subscore is 6.  Strength in flexion and extension at the shoulders, elbows, wrists, hips, knee, and ankle 5/5 bilaterally EHL 5/5 bilaterally Grip strength 5/5 bilaterally Finger to nose, heel toe shin and rapid alternating movements intact bilaterally. Sensation over the dorsum of the hand over the 1st, second and 5th metacarpal, and the lateral forearm and lateral shoulder intact bilaterally.  Sensation over the medial and lateral malleolus, and over the 1st metatarsal intact bilaterally.  Skin: Skin is warm. No rash noted. She is not diaphoretic.  Psychiatric: She has a normal mood and affect. Her behavior is normal.    ED Course  Procedures (including critical care time) Labs Review Labs Reviewed  COMPREHENSIVE METABOLIC PANEL - Abnormal; Notable for the following:    Glucose, Bld 258 (*)    Total Bilirubin <0.2 (*)    GFR calc non Af Amer 70 (*)    GFR calc Af Amer 81 (*)    All other components within normal limits  URINALYSIS, ROUTINE W REFLEX MICROSCOPIC - Abnormal; Notable for the following:    Color, Urine AMBER (*)    Glucose, UA 500 (*)    All other components within normal limits  CBC WITH DIFFERENTIAL  LIPASE, BLOOD    Imaging Review No results found.   EKG Interpretation None      MDM   Final diagnoses:  Low back pain radiating to right lower extremity    Pt with chronic OA presents with back pain that started after heavy cleaning at home. Contrary to the nursing note, the patient has lower abdominal pain for "several weeks" that has had an extensive work up including a neg CT abd/pelv (nml aorta diameter as well) and colonoscopy which was sig for only diverticulosis. Abdominal pain is unchanged from previously. NO GU symptoms. No N/V/D. Has known LE neuropathy, but no new numbness or tingling. No Weakness. Motor fxn intact in BLE. TTP of the right lumbar para spinous muscle and + right straight leg raise. Likely MSK pain vs  sciatica. Has home tramadol that helps. No contraindications to NSAIDS and she reports she can take these. Rec schedule 600 mg Motrin every 6 hours for the next 48 hours. Tramadol PRN for breakthrough symptoms. Gave an rx for spasm, 10 mg TID. FU with PCP in the next week if symptoms do not improve for possible physical therapy. Strong return precautions given for worsening symptoms or any other alarming or concerning symptoms or issues. The patient was in agreement with the treatment plan and I answered all of their questions. The patient was stable for dc. At dc, the patient ambulated without difficulty, was moving all four extremities, symptoms improved, NAD. and AOx4 Care discussed with my attending, Dr. Sabra Heck. If performed and available, imaging studies and labs reviewed.    Kelby Aline, MD 03/02/14 [phone]

## 2014-03-02 NOTE — Discharge Instructions (Signed)

## 2014-03-02 NOTE — ED Notes (Signed)
Pt presents to department for evaluation of abdominal pain radiating to middle back. Ongoing for several days. Recently had colonoscopy on Monday. 8/10 pain upon arrival, becomes worse with movement. Denies urinary symptoms. Last BM this morning was normal.

## 2014-03-03 NOTE — ED Provider Notes (Signed)
I saw and evaluated the patient, reviewed the resident's note and I agree with the findings and plan.  Please see my separate note regarding my evaluation of the patient.  Clinical Impression:  Back Pain  Johnna Acosta, MD 03/03/14 1430

## 2014-03-04 DIAGNOSIS — E1149 Type 2 diabetes mellitus with other diabetic neurological complication: Secondary | ICD-10-CM | POA: Diagnosis not present

## 2014-03-04 DIAGNOSIS — E1142 Type 2 diabetes mellitus with diabetic polyneuropathy: Secondary | ICD-10-CM | POA: Diagnosis not present

## 2014-03-08 ENCOUNTER — Encounter: Payer: Medicare Other | Attending: Internal Medicine | Admitting: *Deleted

## 2014-03-08 VITALS — Ht <= 58 in | Wt 131.8 lb

## 2014-03-08 DIAGNOSIS — E119 Type 2 diabetes mellitus without complications: Secondary | ICD-10-CM | POA: Diagnosis not present

## 2014-03-08 DIAGNOSIS — IMO0002 Reserved for concepts with insufficient information to code with codable children: Secondary | ICD-10-CM

## 2014-03-08 DIAGNOSIS — Z713 Dietary counseling and surveillance: Secondary | ICD-10-CM | POA: Insufficient documentation

## 2014-03-08 DIAGNOSIS — E1065 Type 1 diabetes mellitus with hyperglycemia: Secondary | ICD-10-CM

## 2014-03-08 DIAGNOSIS — M19049 Primary osteoarthritis, unspecified hand: Secondary | ICD-10-CM | POA: Diagnosis not present

## 2014-03-08 DIAGNOSIS — Z794 Long term (current) use of insulin: Secondary | ICD-10-CM | POA: Diagnosis not present

## 2014-03-11 NOTE — Progress Notes (Signed)
  Pump Follow Up Progress Note on 03/08/14  Orders received from MD giving me permission to make insulin pump adjustments for the following patient.  Today's visit was to review [facility] reports for BG control assessment      Hypoglycemia Hyperglycemia Comments  Overnight Period:      Pre-Meal:    Breakfast  YES BASAL    Lunch  YES BASAL   Supper  YES BASAL   Post-Meal: Breakfast      Lunch      Supper     Bedtime:       Comments: Average BG for past 2 weeks on the pump is 186 +/- 66 mg/dl with no hypoglycemia.  Still has gradual rise in BG between meals throughout the day so plan to increase Basal Rates by 10% for all 24 hours.  Corrections to high BG not bringing her into target zone, so plan to increase ISF per below.   NOTE: Target Range per Dr. Buddy Duty to be 100-100. This has now been accomplished and changed in her pump. Pump Settings: Date: Current Date: 03/08/14  Changes in bold print   Basal Rate: Carb Ratio Sensitivity  Basal Rate: Carb Ratio Sensitivity   MN: 1.50 5.0 20 MN: 1.65  (+) 5.0 18  (+)  10 A 2.00   10 A 2.20  (+)                                                  Plan: patient to continue to use Bolus Wizard and change out pump every 3 days. She is to call me if BG below 70 or above 300 mg/dl. She will follow up with Dr. Buddy Duty and then with me in 2 months prior to her 3 month appointment with Dr. Buddy Duty to evaluate these changes before her next A1c is drawn.

## 2014-03-13 ENCOUNTER — Encounter (HOSPITAL_COMMUNITY): Payer: Self-pay | Admitting: Emergency Medicine

## 2014-03-13 ENCOUNTER — Observation Stay (HOSPITAL_COMMUNITY)
Admission: EM | Admit: 2014-03-13 | Discharge: 2014-03-15 | Disposition: A | Discharge status: Home / Self Care | Payer: Medicare Other | Attending: Internal Medicine | Admitting: Internal Medicine

## 2014-03-13 ENCOUNTER — Emergency Department (HOSPITAL_COMMUNITY): Payer: Medicare Other

## 2014-03-13 DIAGNOSIS — R05 Cough: Secondary | ICD-10-CM | POA: Diagnosis not present

## 2014-03-13 DIAGNOSIS — Z9641 Presence of insulin pump (external) (internal): Secondary | ICD-10-CM | POA: Diagnosis not present

## 2014-03-13 DIAGNOSIS — J4489 Other specified chronic obstructive pulmonary disease: Secondary | ICD-10-CM | POA: Insufficient documentation

## 2014-03-13 DIAGNOSIS — R0789 Other chest pain: Secondary | ICD-10-CM | POA: Diagnosis present

## 2014-03-13 DIAGNOSIS — I6789 Other cerebrovascular disease: Secondary | ICD-10-CM | POA: Diagnosis not present

## 2014-03-13 DIAGNOSIS — Z803 Family history of malignant neoplasm of breast: Secondary | ICD-10-CM | POA: Insufficient documentation

## 2014-03-13 DIAGNOSIS — I6350 Cerebral infarction due to unspecified occlusion or stenosis of unspecified cerebral artery: Principal | ICD-10-CM | POA: Insufficient documentation

## 2014-03-13 DIAGNOSIS — K219 Gastro-esophageal reflux disease without esophagitis: Secondary | ICD-10-CM | POA: Insufficient documentation

## 2014-03-13 DIAGNOSIS — G40909 Epilepsy, unspecified, not intractable, without status epilepticus: Secondary | ICD-10-CM | POA: Diagnosis not present

## 2014-03-13 DIAGNOSIS — F17200 Nicotine dependence, unspecified, uncomplicated: Secondary | ICD-10-CM | POA: Diagnosis not present

## 2014-03-13 DIAGNOSIS — M069 Rheumatoid arthritis, unspecified: Secondary | ICD-10-CM | POA: Diagnosis not present

## 2014-03-13 DIAGNOSIS — IMO0001 Reserved for inherently not codable concepts without codable children: Secondary | ICD-10-CM | POA: Insufficient documentation

## 2014-03-13 DIAGNOSIS — R5383 Other fatigue: Secondary | ICD-10-CM | POA: Diagnosis not present

## 2014-03-13 DIAGNOSIS — Z882 Allergy status to sulfonamides status: Secondary | ICD-10-CM | POA: Insufficient documentation

## 2014-03-13 DIAGNOSIS — R42 Dizziness and giddiness: Secondary | ICD-10-CM | POA: Insufficient documentation

## 2014-03-13 DIAGNOSIS — E1142 Type 2 diabetes mellitus with diabetic polyneuropathy: Secondary | ICD-10-CM | POA: Diagnosis not present

## 2014-03-13 DIAGNOSIS — R079 Chest pain, unspecified: Secondary | ICD-10-CM | POA: Diagnosis not present

## 2014-03-13 DIAGNOSIS — J449 Chronic obstructive pulmonary disease, unspecified: Secondary | ICD-10-CM | POA: Diagnosis present

## 2014-03-13 DIAGNOSIS — R0602 Shortness of breath: Secondary | ICD-10-CM | POA: Diagnosis not present

## 2014-03-13 DIAGNOSIS — Z7982 Long term (current) use of aspirin: Secondary | ICD-10-CM | POA: Insufficient documentation

## 2014-03-13 DIAGNOSIS — Z880 Allergy status to penicillin: Secondary | ICD-10-CM | POA: Insufficient documentation

## 2014-03-13 DIAGNOSIS — E785 Hyperlipidemia, unspecified: Secondary | ICD-10-CM

## 2014-03-13 DIAGNOSIS — I639 Cerebral infarction, unspecified: Secondary | ICD-10-CM | POA: Diagnosis present

## 2014-03-13 DIAGNOSIS — R059 Cough, unspecified: Secondary | ICD-10-CM | POA: Diagnosis not present

## 2014-03-13 DIAGNOSIS — E1149 Type 2 diabetes mellitus with other diabetic neurological complication: Secondary | ICD-10-CM | POA: Diagnosis not present

## 2014-03-13 DIAGNOSIS — Z888 Allergy status to other drugs, medicaments and biological substances status: Secondary | ICD-10-CM | POA: Insufficient documentation

## 2014-03-13 DIAGNOSIS — E1165 Type 2 diabetes mellitus with hyperglycemia: Secondary | ICD-10-CM | POA: Diagnosis present

## 2014-03-13 DIAGNOSIS — R1310 Dysphagia, unspecified: Secondary | ICD-10-CM | POA: Insufficient documentation

## 2014-03-13 DIAGNOSIS — Z885 Allergy status to narcotic agent status: Secondary | ICD-10-CM | POA: Diagnosis not present

## 2014-03-13 DIAGNOSIS — Z800 Family history of malignant neoplasm of digestive organs: Secondary | ICD-10-CM | POA: Insufficient documentation

## 2014-03-13 DIAGNOSIS — F411 Generalized anxiety disorder: Secondary | ICD-10-CM | POA: Insufficient documentation

## 2014-03-13 DIAGNOSIS — Z79899 Other long term (current) drug therapy: Secondary | ICD-10-CM | POA: Insufficient documentation

## 2014-03-13 DIAGNOSIS — IMO0002 Reserved for concepts with insufficient information to code with codable children: Secondary | ICD-10-CM

## 2014-03-13 DIAGNOSIS — E119 Type 2 diabetes mellitus without complications: Secondary | ICD-10-CM | POA: Diagnosis not present

## 2014-03-13 DIAGNOSIS — Z8719 Personal history of other diseases of the digestive system: Secondary | ICD-10-CM | POA: Diagnosis not present

## 2014-03-13 DIAGNOSIS — R209 Unspecified disturbances of skin sensation: Secondary | ICD-10-CM | POA: Diagnosis not present

## 2014-03-13 DIAGNOSIS — R5381 Other malaise: Secondary | ICD-10-CM | POA: Diagnosis not present

## 2014-03-13 LAB — CBC
HCT: 42.5 % (ref 36.0–46.0)
Hemoglobin: 14.9 g/dL (ref 12.0–15.0)
MCH: 30.3 pg (ref 26.0–34.0)
MCHC: 35.1 g/dL (ref 30.0–36.0)
MCV: 86.4 fL (ref 78.0–100.0)
Platelets: 187 10*3/uL (ref 150–400)
RBC: 4.92 MIL/uL (ref 3.87–5.11)
RDW: 13.8 % (ref 11.5–15.5)
WBC: 9.6 10*3/uL (ref 4.0–10.5)

## 2014-03-13 LAB — COMPREHENSIVE METABOLIC PANEL
ALT: 26 U/L (ref 0–35)
ANION GAP: 17 — AB (ref 5–15)
AST: 28 U/L (ref 0–37)
Albumin: 4.1 g/dL (ref 3.5–5.2)
Alkaline Phosphatase: 67 U/L (ref 39–117)
BUN: 18 mg/dL (ref 6–23)
CHLORIDE: 100 meq/L (ref 96–112)
CO2: 22 mEq/L (ref 19–32)
CREATININE: 0.93 mg/dL (ref 0.50–1.10)
Calcium: 9.6 mg/dL (ref 8.4–10.5)
GFR calc Af Amer: 79 mL/min — ABNORMAL LOW (ref >90)
GFR calc non Af Amer: 68 mL/min — ABNORMAL LOW (ref >90)
Glucose, Bld: 169 mg/dL — ABNORMAL HIGH (ref 70–99)
Potassium: 4.3 mEq/L (ref 3.7–5.3)
Sodium: 139 mEq/L (ref 137–147)
Total Protein: 7.6 g/dL (ref 6.0–8.3)

## 2014-03-13 LAB — RAPID URINE DRUG SCREEN, HOSP PERFORMED
Amphetamines: NOT DETECTED
BARBITURATES: NOT DETECTED
Benzodiazepines: NOT DETECTED
Cocaine: NOT DETECTED
Opiates: NOT DETECTED
TETRAHYDROCANNABINOL: NOT DETECTED

## 2014-03-13 LAB — I-STAT TROPONIN, ED: Troponin i, poc: 0 ng/mL (ref 0.00–0.08)

## 2014-03-13 LAB — CBG MONITORING, ED: GLUCOSE-CAPILLARY: 169 mg/dL — AB (ref 70–99)

## 2014-03-13 MED ORDER — ENOXAPARIN SODIUM 40 MG/0.4ML ~~LOC~~ SOLN
40.0000 mg | SUBCUTANEOUS | Status: DC
  Administered 2014-03-14 – 2014-03-15 (×2): 40 mg via SUBCUTANEOUS
  Filled 2014-03-13 (×2): qty 0.4

## 2014-03-13 MED ORDER — INSULIN ASPART 100 UNIT/ML ~~LOC~~ SOLN: 0.0000 [IU] | Freq: Three times a day (TID) | SUBCUTANEOUS | Status: DC

## 2014-03-13 MED ORDER — ALBUTEROL SULFATE (2.5 MG/3ML) 0.083% IN NEBU: 3.0000 mL | INHALATION_SOLUTION | Freq: Four times a day (QID) | RESPIRATORY_TRACT | Status: DC | PRN

## 2014-03-13 MED ORDER — PANTOPRAZOLE SODIUM 40 MG PO TBEC
40.0000 mg | DELAYED_RELEASE_TABLET | Freq: Every day | ORAL | Status: DC
  Administered 2014-03-14 – 2014-03-15 (×2): 40 mg via ORAL
  Filled 2014-03-13 (×2): qty 1

## 2014-03-13 MED ORDER — SENNOSIDES-DOCUSATE SODIUM 8.6-50 MG PO TABS: 1.0000 | ORAL_TABLET | Freq: Every evening | ORAL | Status: DC | PRN

## 2014-03-13 MED ORDER — CYCLOBENZAPRINE HCL 10 MG PO TABS
10.0000 mg | ORAL_TABLET | Freq: Three times a day (TID) | ORAL | Status: DC | PRN
  Administered 2014-03-14: 10 mg via ORAL
  Filled 2014-03-13: qty 1

## 2014-03-13 MED ORDER — ACETAMINOPHEN 650 MG RE SUPP: 650.0000 mg | RECTAL | Status: DC | PRN

## 2014-03-13 MED ORDER — TRAMADOL HCL 50 MG PO TABS
50.0000 mg | ORAL_TABLET | Freq: Four times a day (QID) | ORAL | Status: DC | PRN
  Administered 2014-03-14: 50 mg via ORAL
  Filled 2014-03-13 (×2): qty 1

## 2014-03-13 MED ORDER — ACETAMINOPHEN 325 MG PO TABS: 650.0000 mg | ORAL_TABLET | ORAL | Status: DC | PRN

## 2014-03-13 MED ORDER — SODIUM CHLORIDE 0.9 % IV SOLN
INTRAVENOUS | Status: AC
  Administered 2014-03-14: 01:00:00 via INTRAVENOUS

## 2014-03-13 MED ORDER — STROKE: EARLY STAGES OF RECOVERY BOOK
Freq: Once | Status: DC
Start: 2014-03-13 — End: 2014-03-15
  Filled 2014-03-13: qty 1

## 2014-03-13 MED ORDER — MOMETASONE FURO-FORMOTEROL FUM 100-5 MCG/ACT IN AERO
2.0000 | INHALATION_SPRAY | Freq: Two times a day (BID) | RESPIRATORY_TRACT | Status: DC
Start: 2014-03-13 — End: 2014-03-15
  Administered 2014-03-15: 2 via RESPIRATORY_TRACT
  Filled 2014-03-13: qty 8.8

## 2014-03-13 MED ORDER — INSULIN GLARGINE 100 UNIT/ML ~~LOC~~ SOLN
14.0000 [IU] | Freq: Every day | SUBCUTANEOUS | Status: DC
  Administered 2014-03-14: 14 [IU] via SUBCUTANEOUS
  Filled 2014-03-13 (×3): qty 0.14

## 2014-03-13 NOTE — ED Notes (Signed)
Dr. Mingo Amber at bedside, due to symptoms resolving and unsure if symptoms completely resolved since last night, not to page code stroke.

## 2014-03-13 NOTE — H&P (Signed)
[facility] History and Physical  Arlyne Brandes VEL:[id number] DOB: 11/28/1960 DOA: 03/13/2014  Referring physician: ER physician. PCP: Lujean Amel, MD   Chief Complaint: Dizziness and left-sided weakness.  HPI: [name] is a 53 y.o. female with history of diabetes mellitus, COPD presents to the ER with complaints of dizziness and left-sided weakness. Patient has been having dizziness since last night which increases on motion and started developing left upper and lower extremity weakness this evening around 7 PM. Patient also had left facial numbness with headache. Patient's headache is mostly around the head like a band. Denies any visual symptoms or any difficulty swallowing. Patient had mild slurring of speech. In the ER CT head was negative for anything acute. Patient's weakness has largely improved. Patient also had some left facial droop which is resolved. On-call neurologist Dr. Doy Mince and patient has been admitted for further management. Patient states that she also had some off and on chest pain left anterior chest wall. On exam patient's chest pain is reproducible on deep palpation.  Review of Systems: As presented in the history of presenting illness, rest negative.  Past Medical History  Diagnosis Date  . Drug addiction clean for 18 months    cocaine  . Diabetes mellitus   . Diverticulosis   . Internal hemorrhoids   . Depression   . Diabetic neuropathy   . GERD (gastroesophageal reflux disease)   . Fibromyalgia   . Hyperlipidemia   . Rheumatoid arthritis(714.0)   . Anxiety   . Arthritis   . PONV (postoperative nausea and vomiting)   . Wears glasses   . Full dentures   . COPD (chronic obstructive pulmonary disease)    Past Surgical History  Procedure Laterality Date  . Cataract extraction  2009    bilateral  . Appendectomy    . Cesarean section    . Hernia repair      incisional  . Rotatator cuff repair  2001    left  . Dental surgery  2011   all teeth removed  . Tubal ligation  2002  . Tonsillectomy    . Knee arthroscopy  Right  . Knee arthroscopy      lt and rt-2014  . Eye surgery  08,09    both cataracts  . Trigger finger release Right 12/20/2013    Procedure: RIGHT THUMB TRIGGER  RELEASE ;  Surgeon: Tennis Must, MD;  Location: [place];  Service: Orthopedics;  Laterality: Right;   Social History:  reports that she has been smoking Cigarettes.  She has been smoking about 0.50 packs per day. She has never used smokeless tobacco. She reports that she does not drink alcohol or use illicit drugs. Where does patient live home. Can patient participate in ADLs? Yes.  Allergies  Allergen Reactions  . Penicillins Rash    Childhood allergy  . Sulfa Antibiotics Hives and Shortness Of Breath  . Statins Other (See Comments)    Reaction: muscle pain  . Codeine Nausea Only    Can tolerate with pre-med  . Keflet [Cephalexin] Palpitations    Family History:  Family History  Problem Relation Age of Onset  . Breast cancer Mother   . Breast cancer Maternal Aunt   . Colon cancer Maternal Grandfather   . Diabetes Paternal Grandmother   . Heart failure Maternal Grandmother     CHF      Prior to Admission medications   Medication Sig Start Date End Date Taking? Authorizing Provider  albuterol (PROVENTIL  HFA;VENTOLIN HFA) 108 (90 BASE) MCG/ACT inhaler Inhale 1 puff into the lungs every 6 (six) hours as needed for wheezing or shortness of breath.    Yes Historical Provider, MD  aspirin 81 MG tablet Take 81 mg by mouth daily.   Yes Historical Provider, MD  cyclobenzaprine (FLEXERIL) 10 MG tablet Take 1 tablet (10 mg total) by mouth 3 (three) times daily as needed for muscle spasms. 03/02/14  Yes Kelby Aline, MD  fluticasone-salmeterol (ADVAIR HFA) [phone] MCG/ACT inhaler Inhale 1 puff into the lungs daily.    Yes Historical Provider, MD  insulin lispro (HUMALOG) 100 UNIT/ML injection Inject into the skin every  morning. Insulin pump-base rate-   Yes Historical Provider, MD  omeprazole (PRILOSEC) 20 MG capsule Take 20 mg by mouth daily.   Yes Historical Provider, MD  promethazine (PHENERGAN) 25 MG tablet Take 25 mg by mouth every 6 (six) hours as needed for nausea.    Yes Historical Provider, MD  traMADol (ULTRAM) 50 MG tablet Take 50 mg by mouth every 6 (six) hours as needed (pain).   Yes Historical Provider, MD    Physical Exam: Filed Vitals:   03/13/14 2207 03/13/14 2215 03/13/14 2230 03/13/14 2310  BP:  116/88 112/33 146/57  Pulse:  79 81 79  Temp: 98 F (36.7 C)   99 F (37.2 C)  TempSrc:    Oral  Resp:  24 23 16   Height:    4\' 11"  (1.499 m)  Weight:    59.603 kg (131 lb 6.4 oz)  SpO2:  98% 98% 100%     General:  Well-developed and nourished.  Eyes: Anicteric no pallor.  ENT: No discharge from the ears eyes nose mouth.  Neck: No mass felt.  Cardiovascular: S1-S2 heard.  Respiratory: No rhonchi or crepitations.  Abdomen: Soft nontender bowel sounds present.  Skin: No rash.  Musculoskeletal: No edema.  Psychiatric: Appears normal.  Neurologic: Alert and oriented to time place and person. Moves all extremities 5 x 5 with mild weakness in the left upper and lower extremities. No facial asymmetry. Tongue is midline. PERRLA positive.  Labs on Admission:  Basic Metabolic Panel:  Recent Labs Lab 03/13/14 2052  NA 139  K 4.3  CL 100  CO2 22  GLUCOSE 169*  BUN 18  CREATININE 0.93  CALCIUM 9.6   Liver Function Tests:  Recent Labs Lab 03/13/14 2052  AST 28  ALT 26  ALKPHOS 67  BILITOT <0.2*  PROT 7.6  ALBUMIN 4.1   No results found for this basename: LIPASE, AMYLASE,  in the last 168 hours No results found for this basename: AMMONIA,  in the last 168 hours CBC:  Recent Labs Lab 03/13/14 2052  WBC 9.6  HGB 14.9  HCT 42.5  MCV 86.4  PLT 187   Cardiac Enzymes: No results found for this basename: CKTOTAL, CKMB, CKMBINDEX, TROPONINI,  in the last 168  hours  BNP (last 3 results) No results found for this basename: PROBNP,  in the last 8760 hours CBG:  Recent Labs Lab 03/13/14 2039  GLUCAP 169*    Radiological Exams on Admission: Dg Chest 2 View  03/13/2014   CLINICAL DATA:  Shortness of breath and cough today. Left sided chest pain.  EXAM: CHEST  2 VIEW  COMPARISON:  06/16/2013  FINDINGS: The heart size and mediastinal contours are within normal limits. Both lungs are clear. The visualized skeletal structures are unremarkable.  IMPRESSION: No active cardiopulmonary disease.   Electronically Signed  By: Rozetta Nunnery M.D.   On: 03/13/2014 22:13   Ct Head Wo Contrast  03/13/2014   CLINICAL DATA:  Code stroke. Left-sided facial numbness and tingling. Left arm weakness. Dizziness, headache.  EXAM: CT HEAD WITHOUT CONTRAST  TECHNIQUE: Contiguous axial images were obtained from the base of the skull through the vertex without intravenous contrast.  COMPARISON:  None.  FINDINGS: There is no intra or extra-axial fluid collection or mass lesion. The basilar cisterns and ventricles have a normal appearance. There is no CT evidence for acute infarction or hemorrhage. Bone windows are negative.  IMPRESSION: 1. No evidence for acute intracranial abnormality. 2. Critical Value/emergent results were called by telephone at the time of interpretation on 03/13/2014 at 9:04 pm to Dr. Evelina Bucy , who verbally acknowledged these results.   Electronically Signed   By: Shon Hale M.D.   On: 03/13/2014 21:05    EKG: Independently reviewed. Normal sinus rhythm.  Assessment/Plan Active Problems:   Stroke   Dizziness   Diabetes mellitus type 2, uncontrolled   COPD (chronic obstructive pulmonary disease)   Chest pain, atypical   1. Possible CVA - patient's symptoms are concerning for CVA. Patient has been placed on neurochecks. Patient has failed swallow evaluation. Speech therapist consult. Get MRI/MRA brain 2-D echo carotid Doppler. Aspirin. 2. Diabetes  mellitus type 2 - since patient is n.p.o. patient's insulin pump as been discontinued. Patient usually takes basal insulin rate of 2 units from 10 AM to 12 AM and 1.6 units from 12 AM to 10 AM. At this time I have placed patient on Lantus insulin 14 units subcutaneous at bedtime with sliding scale coverage. Closely follow CBGs trends. Once patient starts eating patient has to be placed back on insulin pump. 3. COPD - presently not wheezing. Continue inhalers. 4. Atypical chest pain - cycle cardiac markers. Check 2-D echo. Patient is on aspirin. Patient is currently chest pain-free.    Code Status: Full code.  Family Communication: None.  Disposition Plan: Admit to inpatient.    KAKRAKANDY,ARSHAD N. [facility] Pager [phone].  If 7PM-7AM, please contact night-coverage [email or web address] Password [facility] 03/13/2014, 11:36 PM

## 2014-03-13 NOTE — ED Provider Notes (Signed)
CSN: [id number]     Arrival date & time 03/13/14  2027 History   First MD Initiated Contact with Patient 03/13/14 2027     Chief Complaint  Patient presents with  . Weakness  . Numbness  . Transient Ischemic Attack     (Consider location/radiation/quality/duration/timing/severity/associated sxs/prior Treatment) HPI Comments: Friend noticed slurred speech, L facial droop that began less than 2 hours ago. It is improving.  Patient is a 53 y.o. female presenting with weakness. The history is provided by the patient.  Weakness This is a recurrent (Happened yesterday also) problem. The current episode started 1 to 2 hours ago. The problem occurs constantly. The problem has been gradually improving. Associated symptoms include chest pain (left sided, sharp, radiates to back). Pertinent negatives include no shortness of breath. Nothing aggravates the symptoms. Nothing relieves the symptoms.    Past Medical History  Diagnosis Date  . Drug addiction clean for 18 months    cocaine  . Diabetes mellitus   . Diverticulosis   . Internal hemorrhoids   . Depression   . Diabetic neuropathy   . GERD (gastroesophageal reflux disease)   . Fibromyalgia   . Hyperlipidemia   . Rheumatoid arthritis(714.0)   . Anxiety   . Arthritis   . PONV (postoperative nausea and vomiting)   . Wears glasses   . Full dentures   . COPD (chronic obstructive pulmonary disease)    Past Surgical History  Procedure Laterality Date  . Cataract extraction  2009    bilateral  . Appendectomy    . Cesarean section    . Hernia repair      incisional  . Rotatator cuff repair  2001    left  . Dental surgery  2011    all teeth removed  . Tubal ligation  2002  . Tonsillectomy    . Knee arthroscopy  Right  . Knee arthroscopy      lt and rt-2014  . Eye surgery  08,09    both cataracts  . Trigger finger release Right 12/20/2013    Procedure: RIGHT THUMB TRIGGER  RELEASE ;  Surgeon: Tennis Must, MD;  Location: [place];  Service: Orthopedics;  Laterality: Right;   Family History  Problem Relation Age of Onset  . Breast cancer Mother   . Breast cancer Maternal Aunt   . Colon cancer Maternal Grandfather   . Diabetes Paternal Grandmother   . Heart failure Maternal Grandmother     CHF   History  Substance Use Topics  . Smoking status: Current Some Day Smoker -- 0.50 packs/day    Types: Cigarettes  . Smokeless tobacco: Never Used  . Alcohol Use: No   OB History   Grav Para Term Preterm Abortions TAB SAB Ect Mult Living                 Review of Systems  Constitutional: Negative for fever.  Respiratory: Negative for cough and shortness of breath.   Cardiovascular: Positive for chest pain (left sided, sharp, radiates to back).  Neurological: Positive for weakness.  All other systems reviewed and are negative.     Allergies  Penicillins; Sulfa antibiotics; Statins; Codeine; and Keflet  Home Medications   Prior to Admission medications   Medication Sig Start Date End Date Taking? Authorizing Provider  albuterol (PROVENTIL HFA;VENTOLIN HFA) 108 (90 BASE) MCG/ACT inhaler Inhale into the lungs every 6 (six) hours as needed for wheezing or shortness of breath.    Historical  Provider, MD  aspirin 81 MG tablet Take 81 mg by mouth daily.    Historical Provider, MD  cyclobenzaprine (FLEXERIL) 10 MG tablet Take 1 tablet (10 mg total) by mouth 3 (three) times daily as needed for muscle spasms. 03/02/14   Kelby Aline, MD  doxycycline (VIBRA-TABS) 100 MG tablet Take 1 tablet (100 mg total) by mouth 2 (two) times daily. 02/17/14   Waldemar Dickens, MD  fluticasone-salmeterol (ADVAIR HFA) [phone] MCG/ACT inhaler Inhale 1 puff into the lungs daily.     Historical Provider, MD  HYDROcodone-acetaminophen (NORCO/VICODIN) 5-325 MG per tablet Take 1-2 tablets by mouth every 6 (six) hours as needed for moderate pain.    Historical Provider, MD  insulin lispro (HUMALOG) 100 UNIT/ML injection Inject  into the skin every morning. Insulin pump-base rate-    Historical Provider, MD  omeprazole (PRILOSEC) 20 MG capsule Take 20 mg by mouth daily.    Historical Provider, MD  promethazine (PHENERGAN) 25 MG tablet Take 25 mg by mouth every 6 (six) hours as needed. For nausea    Historical Provider, MD  traMADol (ULTRAM) 50 MG tablet Take 50 mg by mouth every 6 (six) hours as needed for moderate pain.    Historical Provider, MD   SpO2 96% Physical Exam  Nursing note and vitals reviewed. Constitutional: She is oriented to person, place, and time. She appears well-developed and well-nourished. No distress.  HENT:  Head: Normocephalic and atraumatic.  Mouth/Throat: Oropharynx is clear and moist.  Eyes: EOM are normal. Pupils are equal, round, and reactive to light.  Neck: Normal range of motion. Neck supple.  Cardiovascular: Normal rate and regular rhythm.  Exam reveals no friction rub.   No murmur heard. Pulmonary/Chest: Effort normal and breath sounds normal. No respiratory distress. She has no wheezes. She has no rales.  Abdominal: Soft. She exhibits no distension. There is no tenderness. There is no rebound.  Musculoskeletal: Normal range of motion. She exhibits no edema.  Neurological: She is alert and oriented to person, place, and time. A sensory deficit (altered light touch on L arm, L face) is present. No cranial nerve deficit. She exhibits normal muscle tone. Coordination and gait normal. GCS eye subscore is 4. GCS verbal subscore is 5. GCS motor subscore is 6.  Very mild L arm pronator drift No slurred speech  Skin: No rash noted. She is not diaphoretic.    ED Course  Procedures (including critical care time) Labs Review Labs Reviewed  COMPREHENSIVE METABOLIC PANEL - Abnormal; Notable for the following:    Glucose, Bld 169 (*)    Total Bilirubin <0.2 (*)    GFR calc non Af Amer 68 (*)    GFR calc Af Amer 79 (*)    Anion gap 17 (*)    All other components within normal limits   CBG MONITORING, ED - Abnormal; Notable for the following:    Glucose-Capillary 169 (*)    All other components within normal limits  CBC  URINE RAPID DRUG SCREEN (HOSP PERFORMED)  COMPREHENSIVE METABOLIC PANEL  CBC WITH DIFFERENTIAL  TSH  TROPONIN I  TROPONIN I  TROPONIN I  SEDIMENTATION RATE  HEMOGLOBIN A1C  LIPID PANEL  CBC  CREATININE, SERUM  I-STAT TROPOININ, ED    Imaging Review Dg Chest 2 View  03/13/2014   CLINICAL DATA:  Shortness of breath and cough today. Left sided chest pain.  EXAM: CHEST  2 VIEW  COMPARISON:  06/16/2013  FINDINGS: The heart size and mediastinal contours are  within normal limits. Both lungs are clear. The visualized skeletal structures are unremarkable.  IMPRESSION: No active cardiopulmonary disease.   Electronically Signed   By: Rozetta Nunnery M.D.   On: 03/13/2014 22:13   Ct Head Wo Contrast  03/13/2014   CLINICAL DATA:  Code stroke. Left-sided facial numbness and tingling. Left arm weakness. Dizziness, headache.  EXAM: CT HEAD WITHOUT CONTRAST  TECHNIQUE: Contiguous axial images were obtained from the base of the skull through the vertex without intravenous contrast.  COMPARISON:  None.  FINDINGS: There is no intra or extra-axial fluid collection or mass lesion. The basilar cisterns and ventricles have a normal appearance. There is no CT evidence for acute infarction or hemorrhage. Bone windows are negative.  IMPRESSION: 1. No evidence for acute intracranial abnormality. 2. Critical Value/emergent results were called by telephone at the time of interpretation on 03/13/2014 at 9:04 pm to Dr. Evelina Bucy , who verbally acknowledged these results.   Electronically Signed   By: Shon Hale M.D.   On: 03/13/2014 21:05     EKG Interpretation   Date/Time:  Sunday March 13 2014 20:38:08 EDT Ventricular Rate:  87 PR Interval:  140 QRS Duration: 68 QT Interval:  365 QTC Calculation: 439 R Axis:   69 Text Interpretation:  Sinus rhythm Similar to prior  from 06/2011 Confirmed  by [facility]  MD, [place] ([id number]) on 03/13/2014 8:45:21 PM      MDM   Final diagnoses:  Stroke  Chest pain, atypical  Diabetes mellitus type 2, uncontrolled  Dizziness    53 year old female with history of TIA, diabetes presents with symptoms concerning for stroke. Today she had one episode of this yesterday and another today. She had some left facial tingling, left facial droop, slurred speech. Denies began less than 2 hours ago but is improving per her friend. No slurred speech my exam. No facial droop. NIH at bedside shows altered light touch sensation in L arm, face. Will call Code Stroke. Rapid Response nurse evaluated patient. She stated with dizziness since last night, will not qualify for intervention. Patient admitted by medicine. She failed her swallow screen in the ED.  Evelina Bucy, MD 03/13/14 [phone]

## 2014-03-13 NOTE — ED Notes (Signed)
Pt unsure of LSN, roughly around yesterday afternoon. Pt is outside all windows for code stroke.

## 2014-03-13 NOTE — ED Notes (Signed)
Per [facility], pt from home, left sided facial numbness and tingling. Left arm weakness. Pt also c/o dizziness, headache. Pt is AAOX4, in NAD. Also c/o ULQ abdominal pain that increases with movement. Pt is diabetic, has insulin pump. Pt has hx of TIA.

## 2014-03-13 NOTE — Consult Note (Signed)
Referring Physician: Mingo Amber    Chief Complaint: Dizziness  HPI: [name] is an 53 y.o. female who reports that last evening she had the acute onset of dizziness.  Desribes the dizziness as a vertigo.  Was able to sleep overnight but felt weak today particualrly on the left.  Describes a headaches that surrounds the head like a band.  Also describes numbness on the left.  Her friend noted a left facial droop.  Patient was brought in as a code stroke.  Droop has resolved but dizziness continues.    Date last known well: Date: 03/12/2014 Time last known well: Time: 22:00 tPA Given: No: Outside time window  Past Medical History  Diagnosis Date  . Drug addiction clean for 18 months    cocaine  . Diabetes mellitus   . Diverticulosis   . Internal hemorrhoids   . Depression   . Diabetic neuropathy   . GERD (gastroesophageal reflux disease)   . Fibromyalgia   . Hyperlipidemia   . Rheumatoid arthritis(714.0)   . Anxiety   . Arthritis   . PONV (postoperative nausea and vomiting)   . Wears glasses   . Full dentures   . COPD (chronic obstructive pulmonary disease)     Past Surgical History  Procedure Laterality Date  . Cataract extraction  2009    bilateral  . Appendectomy    . Cesarean section    . Hernia repair      incisional  . Rotatator cuff repair  2001    left  . Dental surgery  2011    all teeth removed  . Tubal ligation  2002  . Tonsillectomy    . Knee arthroscopy  Right  . Knee arthroscopy      lt and rt-2014  . Eye surgery  08,09    both cataracts  . Trigger finger release Right 12/20/2013    Procedure: RIGHT THUMB TRIGGER  RELEASE ;  Surgeon: Tennis Must, MD;  Location: [facility];  Service: Orthopedics;  Laterality: Right;    Family History  Problem Relation Age of Onset  . Breast cancer Mother   . Breast cancer Maternal Aunt   . Colon cancer Maternal Grandfather   . Diabetes Paternal Grandmother   . Heart failure Maternal Grandmother      CHF   Social History:  reports that she has been smoking Cigarettes.  She has been smoking about 0.50 packs per day. She has never used smokeless tobacco. She reports that she does not drink alcohol or use illicit drugs.  Allergies:  Allergies  Allergen Reactions  . Penicillins Rash    Childhood allergy  . Sulfa Antibiotics Hives and Shortness Of Breath  . Statins Other (See Comments)    Reaction: muscle pain  . Codeine Nausea Only    Can tolerate with pre-med  . Keflet [Cephalexin] Palpitations    Medications: I have reviewed the patient's current medications. Prior to Admission:  Current outpatient prescriptions: albuterol (PROVENTIL HFA;VENTOLIN HFA) 108 (90 BASE) MCG/ACT inhaler, Inhale 1 puff into the lungs every 6 (six) hours as needed for wheezing or shortness of breath. , Disp: , Rfl: ;   aspirin 81 MG tablet, Take 81 mg by mouth daily., Disp: , Rfl: ;   cyclobenzaprine (FLEXERIL) 10 MG tablet, Take 1 tablet (10 mg total) by mouth 3 (three) times daily as needed for muscle spasms., Disp: 20 tablet, Rfl: 0 fluticasone-salmeterol (ADVAIR HFA) 115-21 MCG/ACT inhaler, Inhale 1 puff into the lungs daily. ,  Disp: , Rfl: ;   insulin lispro (HUMALOG) 100 UNIT/ML injection, Inject into the skin every morning. Insulin pump-base rate-, Disp: , Rfl: ;   omeprazole (PRILOSEC) 20 MG capsule, Take 20 mg by mouth daily., Disp: , Rfl: ;   promethazine (PHENERGAN) 25 MG tablet, Take 25 mg by mouth every 6 (six) hours as needed for nausea. , Disp: , Rfl:  traMADol (ULTRAM) 50 MG tablet, Take 50 mg by mouth every 6 (six) hours as needed (pain)., Disp: , Rfl:   ROS: History obtained from the patient  General ROS: negative for - chills, fatigue, fever, night sweats, weight gain or weight loss Psychological ROS: negative for - behavioral disorder, hallucinations, memory difficulties, mood swings or suicidal ideation Ophthalmic ROS: negative for - blurry vision, double vision, eye pain or loss  of vision ENT ROS: negative for - epistaxis, nasal discharge, oral lesions, sore throat, tinnitus Allergy and Immunology ROS: negative for - hives or itchy/watery eyes Hematological and Lymphatic ROS: negative for - bleeding problems, bruising or swollen lymph nodes Endocrine ROS: negative for - galactorrhea, hair pattern changes, polydipsia/polyuria or temperature intolerance Respiratory ROS: negative for - cough, hemoptysis, shortness of breath or wheezing Cardiovascular ROS: chest pain Gastrointestinal ROS: negative for - abdominal pain, diarrhea, hematemesis, nausea/vomiting or stool incontinence Genito-Urinary ROS: negative for - dysuria, hematuria, incontinence or urinary frequency/urgency Musculoskeletal ROS: negative for - joint swelling or muscular weakness Neurological ROS: as noted in HPI Dermatological ROS: negative for rash and skin lesion changes  Physical Examination: Blood pressure 126/63, pulse 93, temperature 98 F (36.7 C), temperature source Oral, resp. rate 23, SpO2 99.00%.  Neurologic Examination: Mental Status: Alert, oriented, thought content appropriate.  Speech fluent without evidence of aphasia.  Patient edentulous without dentures at this time.  Able to follow 3 step commands without difficulty. Cranial Nerves: II: Discs flat bilaterally; Visual fields grossly normal, pupils equal, round, reactive to light and accommodation III,IV, VI: ptosis not present, extra-ocular motions intact bilaterally V,VII: smile symmetric, facial light touch sensation decreased on the left side of the face VIII: hearing normal bilaterally IX,X: gag reflex present XI: bilateral shoulder shrug XII: midline tongue extension Motor: Patient able to lift all extremities against gravity but lets all extremities fall to the bed with any resistance.  No focality noted.   Sensory: Pinprick and light touch decreased on the RUE and LLE Deep Tendon Reflexes: 2+ and symmetric with absent AJ's  bilaterally Plantars: Right: mute   Left: mute Cerebellar: normal finger-to-nose and normal heel-to-shin testing bilaterally Gait: Unable to test safely secondary to dizziness CV: pulses palpable throughout    Laboratory Studies:  Basic Metabolic Panel:  Recent Labs Lab 03/13/14 2052  NA 139  K 4.3  CL 100  CO2 22  GLUCOSE 169*  BUN 18  CREATININE 0.93  CALCIUM 9.6    Liver Function Tests:  Recent Labs Lab 03/13/14 2052  AST 28  ALT 26  ALKPHOS 67  BILITOT <0.2*  PROT 7.6  ALBUMIN 4.1   No results found for this basename: LIPASE, AMYLASE,  in the last 168 hours No results found for this basename: AMMONIA,  in the last 168 hours  CBC:  Recent Labs Lab 03/13/14 2052  WBC 9.6  HGB 14.9  HCT 42.5  MCV 86.4  PLT 187    Cardiac Enzymes: No results found for this basename: CKTOTAL, CKMB, CKMBINDEX, TROPONINI,  in the last 168 hours  BNP: No components found with this basename: POCBNP,   CBG:  Recent Labs Lab 03/13/14 2039  GLUCAP 169*    Microbiology: Results for orders placed during the hospital encounter of 02/17/14  WOUND CULTURE     Status: None   Collection Time    02/17/14  1:34 PM      Result Value Ref Range Status   Specimen Description WOUND THIGH LEFT   Final   Special Requests NONE   Final   Gram Stain     Final   Value: FEW WBC PRESENT,BOTH PMN AND MONONUCLEAR     NO SQUAMOUS EPITHELIAL CELLS SEEN     NO ORGANISMS SEEN     Performed at [facility]   Culture     Final   Value: NO GROWTH 2 DAYS     Performed at [facility]   Report Status 02/20/2014 FINAL   Final    Coagulation Studies: No results found for this basename: LABPROT, INR,  in the last 72 hours  Urinalysis: No results found for this basename: COLORURINE, APPERANCEUR, LABSPEC, PHURINE, GLUCOSEU, HGBUR, BILIRUBINUR, KETONESUR, PROTEINUR, UROBILINOGEN, NITRITE, LEUKOCYTESUR,  in the last 168 hours  Lipid Panel: No results found for this basename:  chol, trig, hdl, cholhdl, vldl, ldlcalc    HgbA1C:  No results found for this basename: HGBA1C    Urine Drug Screen:     Component Value Date/Time   LABOPIA NONE DETECTED 03/13/2014 2120   COCAINSCRNUR NONE DETECTED 03/13/2014 2120   LABBENZ NONE DETECTED 03/13/2014 2120   AMPHETMU NONE DETECTED 03/13/2014 2120   THCU NONE DETECTED 03/13/2014 2120   LABBARB NONE DETECTED 03/13/2014 2120    Alcohol Level: No results found for this basename: ETH,  in the last 168 hours  Other results: EKG: sinus rhythm at 87 bpm.  Imaging: Dg Chest 2 View  03/13/2014   CLINICAL DATA:  Shortness of breath and cough today. Left sided chest pain.  EXAM: CHEST  2 VIEW  COMPARISON:  06/16/2013  FINDINGS: The heart size and mediastinal contours are within normal limits. Both lungs are clear. The visualized skeletal structures are unremarkable.  IMPRESSION: No active cardiopulmonary disease.   Electronically Signed   By: Rozetta Nunnery M.D.   On: 03/13/2014 22:13   Ct Head Wo Contrast  03/13/2014   CLINICAL DATA:  Code stroke. Left-sided facial numbness and tingling. Left arm weakness. Dizziness, headache.  EXAM: CT HEAD WITHOUT CONTRAST  TECHNIQUE: Contiguous axial images were obtained from the base of the skull through the vertex without intravenous contrast.  COMPARISON:  None.  FINDINGS: There is no intra or extra-axial fluid collection or mass lesion. The basilar cisterns and ventricles have a normal appearance. There is no CT evidence for acute infarction or hemorrhage. Bone windows are negative.  IMPRESSION: 1. No evidence for acute intracranial abnormality. 2. Critical Value/emergent results were called by telephone at the time of interpretation on 03/13/2014 at 9:04 pm to Dr. Evelina Bucy , who verbally acknowledged these results.   Electronically Signed   By: Shon Hale M.D.   On: 03/13/2014 21:05    Assessment: 53 y.o. female presenting with dizziness and complaints of left sided weakness and numbness.   Symptoms began last evening therefore patient is outside of the time window for tPA.  Initial NIHSS of 2 making patient not a candidate for intervention as well.  Head CT reviewed and shows no acute changes.  Patient with vascular risk factors.  Further work up recommended.    Stroke Risk Factors - diabetes mellitus and hyperlipidemia  Plan: 1. HgbA1c, fasting lipid panel 2. MRI, MRA  of the brain without contrast 3. PT consult, OT consult, Speech consult 4. Echocardiogram 5. Carotid dopplers 6. Prophylactic therapy-Antiplatelet med: Aspirin - dose 325mg  daily 7. Risk factor modification 8. Telemetry monitoring 9. Frequent neuro checks  Case discussed with Dr. Etta Grandchild, MD [facility] [phone] 03/13/2014, 10:38 PM

## 2014-03-13 NOTE — Progress Notes (Signed)
Received report from [place].

## 2014-03-13 NOTE — ED Notes (Signed)
Pt now states yesterday, all symptoms resolved, and then started again right before 7pm tonight.

## 2014-03-13 NOTE — Code Documentation (Signed)
53 yo wf brought in via [facility] for dizziness & Lt side intermittent numbness.  Pt states she developed dizziness yesterday that has been consistent with sitting up or standing.  She states she has had an intermittent occipital HA, LLE weakness, and intermittent facial & LLE weakness since yesterday as well.  She also presents with c/o LUQ/Lt lower chest pain that radiates to her back and down to her hip since yesterday.  See doc flowsheets for code stroke times

## 2014-03-13 NOTE — ED Notes (Signed)
Pt feels like numbness is returning to her face. Weakness noted to left side.

## 2014-03-13 NOTE — ED Notes (Signed)
Attempted report 

## 2014-03-13 NOTE — Progress Notes (Addendum)
Patient arrived to the floor. Safety measures in place. Report given to oncoming RN Jessica P.

## 2014-03-14 ENCOUNTER — Inpatient Hospital Stay (HOSPITAL_COMMUNITY): Payer: Medicare Other

## 2014-03-14 DIAGNOSIS — J449 Chronic obstructive pulmonary disease, unspecified: Secondary | ICD-10-CM

## 2014-03-14 DIAGNOSIS — E785 Hyperlipidemia, unspecified: Secondary | ICD-10-CM

## 2014-03-14 DIAGNOSIS — R209 Unspecified disturbances of skin sensation: Secondary | ICD-10-CM | POA: Diagnosis not present

## 2014-03-14 DIAGNOSIS — R0789 Other chest pain: Secondary | ICD-10-CM | POA: Diagnosis not present

## 2014-03-14 DIAGNOSIS — IMO0001 Reserved for inherently not codable concepts without codable children: Secondary | ICD-10-CM | POA: Diagnosis not present

## 2014-03-14 DIAGNOSIS — I519 Heart disease, unspecified: Secondary | ICD-10-CM

## 2014-03-14 DIAGNOSIS — R42 Dizziness and giddiness: Secondary | ICD-10-CM | POA: Diagnosis not present

## 2014-03-14 DIAGNOSIS — R1310 Dysphagia, unspecified: Secondary | ICD-10-CM | POA: Diagnosis present

## 2014-03-14 DIAGNOSIS — I6350 Cerebral infarction due to unspecified occlusion or stenosis of unspecified cerebral artery: Secondary | ICD-10-CM | POA: Diagnosis not present

## 2014-03-14 LAB — COMPREHENSIVE METABOLIC PANEL
ALT: 23 U/L (ref 0–35)
AST: 21 U/L (ref 0–37)
Albumin: 3.9 g/dL (ref 3.5–5.2)
Alkaline Phosphatase: 62 U/L (ref 39–117)
Anion gap: 16 — ABNORMAL HIGH (ref 5–15)
BUN: 17 mg/dL (ref 6–23)
CALCIUM: 9.5 mg/dL (ref 8.4–10.5)
CO2: 22 mEq/L (ref 19–32)
Chloride: 103 mEq/L (ref 96–112)
Creatinine, Ser: 0.95 mg/dL (ref 0.50–1.10)
GFR calc Af Amer: 77 mL/min — ABNORMAL LOW (ref >90)
GFR calc non Af Amer: 66 mL/min — ABNORMAL LOW (ref >90)
Glucose, Bld: 115 mg/dL — ABNORMAL HIGH (ref 70–99)
Potassium: 3.9 mEq/L (ref 3.7–5.3)
SODIUM: 141 meq/L (ref 137–147)
Total Bilirubin: 0.2 mg/dL — ABNORMAL LOW (ref 0.3–1.2)
Total Protein: 7.1 g/dL (ref 6.0–8.3)

## 2014-03-14 LAB — GLUCOSE, CAPILLARY
GLUCOSE-CAPILLARY: 120 mg/dL — AB (ref 70–99)
GLUCOSE-CAPILLARY: 208 mg/dL — AB (ref 70–99)
Glucose-Capillary: 186 mg/dL — ABNORMAL HIGH (ref 70–99)
Glucose-Capillary: 340 mg/dL — ABNORMAL HIGH (ref 70–99)
Glucose-Capillary: 192 mg/dL — ABNORMAL HIGH (ref 70–99)
Glucose-Capillary: 296 mg/dL — ABNORMAL HIGH (ref 70–99)

## 2014-03-14 LAB — CBC WITH DIFFERENTIAL/PLATELET
Basophils Absolute: 0.1 10*3/uL (ref 0.0–0.1)
Basophils Relative: 1 % (ref 0–1)
EOS ABS: 0.3 10*3/uL (ref 0.0–0.7)
Eosinophils Relative: 3 % (ref 0–5)
HEMATOCRIT: 40.4 % (ref 36.0–46.0)
Hemoglobin: 13.9 g/dL (ref 12.0–15.0)
LYMPHS ABS: 4.8 10*3/uL — AB (ref 0.7–4.0)
Lymphocytes Relative: 46 % (ref 12–46)
MCH: 29.6 pg (ref 26.0–34.0)
MCHC: 34.4 g/dL (ref 30.0–36.0)
MCV: 86.1 fL (ref 78.0–100.0)
MONOS PCT: 7 % (ref 3–12)
Monocytes Absolute: 0.7 10*3/uL (ref 0.1–1.0)
Neutro Abs: 4.5 10*3/uL (ref 1.7–7.7)
Neutrophils Relative %: 43 % (ref 43–77)
Platelets: 191 10*3/uL (ref 150–400)
RBC: 4.69 MIL/uL (ref 3.87–5.11)
RDW: 13.9 % (ref 11.5–15.5)
WBC: 10.4 10*3/uL (ref 4.0–10.5)

## 2014-03-14 LAB — TSH: TSH: 1.73 u[IU]/mL (ref 0.350–4.500)

## 2014-03-14 LAB — LIPID PANEL
Cholesterol: 245 mg/dL — ABNORMAL HIGH (ref 0–200)
HDL: 33 mg/dL — ABNORMAL LOW (ref >39)
LDL Cholesterol: 139 mg/dL — ABNORMAL HIGH (ref 0–99)
Total CHOL/HDL Ratio: 7.4 RATIO
Triglycerides: 363 mg/dL — ABNORMAL HIGH (ref <150)
VLDL: 73 mg/dL — ABNORMAL HIGH (ref 0–40)

## 2014-03-14 LAB — HEMOGLOBIN A1C
HEMOGLOBIN A1C: 8.4 % — AB (ref <5.7)
MEAN PLASMA GLUCOSE: 194 mg/dL — AB (ref <117)

## 2014-03-14 LAB — TROPONIN I
Troponin I: <0.3 ng/mL (ref <0.30)
Troponin I: <0.3 ng/mL (ref <0.30)
Troponin I: <0.3 ng/mL (ref <0.30)

## 2014-03-14 LAB — SEDIMENTATION RATE: SED RATE: 11 mm/h (ref 0–22)

## 2014-03-14 MED ORDER — KETOROLAC TROMETHAMINE 30 MG/ML IJ SOLN
30.0000 mg | Freq: Once | INTRAMUSCULAR | Status: AC
  Administered 2014-03-14: 30 mg via INTRAVENOUS
  Filled 2014-03-14: qty 1

## 2014-03-14 MED ORDER — LORAZEPAM 2 MG/ML IJ SOLN
INTRAMUSCULAR | Status: AC
  Filled 2014-03-14: qty 1

## 2014-03-14 MED ORDER — ONDANSETRON HCL 4 MG/2ML IJ SOLN
4.0000 mg | Freq: Once | INTRAMUSCULAR | Status: AC
  Administered 2014-03-14: 4 mg via INTRAVENOUS
  Filled 2014-03-14: qty 2

## 2014-03-14 MED ORDER — LORAZEPAM 2 MG/ML IJ SOLN
0.5000 mg | Freq: Once | INTRAMUSCULAR | Status: AC
  Administered 2014-03-14: 0.5 mg via INTRAVENOUS

## 2014-03-14 MED ORDER — DIPHENHYDRAMINE HCL 50 MG/ML IJ SOLN
25.0000 mg | Freq: Once | INTRAMUSCULAR | Status: AC
  Administered 2014-03-14: 25 mg via INTRAVENOUS
  Filled 2014-03-14: qty 1

## 2014-03-14 MED ORDER — METOCLOPRAMIDE HCL 5 MG/ML IJ SOLN
10.0000 mg | Freq: Once | INTRAMUSCULAR | Status: AC
  Administered 2014-03-14: 10 mg via INTRAVENOUS
  Filled 2014-03-14: qty 2

## 2014-03-14 MED ORDER — ONDANSETRON HCL 4 MG/2ML IJ SOLN
4.0000 mg | Freq: Four times a day (QID) | INTRAMUSCULAR | Status: DC | PRN
  Administered 2014-03-14: 4 mg via INTRAVENOUS
  Filled 2014-03-14: qty 2

## 2014-03-14 MED ORDER — INSULIN ASPART 100 UNIT/ML ~~LOC~~ SOLN
0.0000 [IU] | Freq: Three times a day (TID) | SUBCUTANEOUS | Status: DC
Start: 2014-03-14 — End: 2014-03-15
  Administered 2014-03-14: 7 [IU] via SUBCUTANEOUS
  Administered 2014-03-14: 2 [IU] via SUBCUTANEOUS
  Administered 2014-03-14: 3 [IU] via SUBCUTANEOUS
  Administered 2014-03-14: 2 [IU] via SUBCUTANEOUS
  Administered 2014-03-15: 5 [IU] via SUBCUTANEOUS

## 2014-03-14 NOTE — Evaluation (Signed)
Supervised and reviewed by Wynelle Dreier MA CCC-SLP  

## 2014-03-14 NOTE — Evaluation (Signed)
Clinical/Bedside Swallow Evaluation Patient Details  Name: Miria Cappelli MRN: [id number] Date of Birth: [birth year]  Today's Date: 03/14/2014 Time: 3016-0109 SLP Time Calculation (min): 25 min  Past Medical History:  Past Medical History  Diagnosis Date  . Drug addiction clean for 18 months    cocaine  . Diabetes mellitus   . Diverticulosis   . Internal hemorrhoids   . Depression   . Diabetic neuropathy   . GERD (gastroesophageal reflux disease)   . Fibromyalgia   . Hyperlipidemia   . Rheumatoid arthritis(714.0)   . Anxiety   . Arthritis   . PONV (postoperative nausea and vomiting)   . Wears glasses   . Full dentures   . COPD (chronic obstructive pulmonary disease)    Past Surgical History:  Past Surgical History  Procedure Laterality Date  . Cataract extraction  2009    bilateral  . Appendectomy    . Cesarean section    . Hernia repair      incisional  . Rotatator cuff repair  2001    left  . Dental surgery  2011    all teeth removed  . Tubal ligation  2002  . Tonsillectomy    . Knee arthroscopy  Right  . Knee arthroscopy      lt and rt-2014  . Eye surgery  08,09    both cataracts  . Trigger finger release Right 12/20/2013    Procedure: RIGHT THUMB TRIGGER  RELEASE ;  Surgeon: Tennis Must, MD;  Location: [place];  Service: Orthopedics;  Laterality: Right;   HPI:  Kyana Aicher is a 53 y.o. female with history of diabetes mellitus, COPD who presented to the ER with complaints of dizziness and left-sided weakness. She developed left upper and lower extremity weakness, left facial numbness with headache, and mild slurring of speech. In the ER CT head was negative for anything acute. Pt's weakness and left facial droop have improved. Chest view shows no active cardiopulmonary disease.   Assessment / Plan / Recommendation Clinical Impression  With SLP providing skilled observation during PO trials, pt showed no signs of aspiration across  consistencies. She was able to self feed, and reported that she usually wears dentures which were unavailable at the time of testing. She does report a feeling a food being stuck in her throat, and was noted to grimace when swallowing.  This seems to be related to recently diagnosed GERD which she takes Omeprazole for. SLP educated pt on esophageal precautions and provided information to take home. Given swallow within functional limits, recommend a regular diet with thin liquids.     Aspiration Risk  Mild    Diet Recommendation Regular;Thin liquid   Supervision: Patient able to self feed Postural Changes and/or Swallow Maneuvers: Seated upright 90 degrees    Other  Recommendations     Follow Up Recommendations       Frequency and Duration        Pertinent Vitals/Pain none    SLP Swallow Goals     Swallow Study Prior Functional Status       General Date of Onset: 03/13/14 HPI: Shifra Swartzentruber is a 53 y.o. female with history of diabetes mellitus, COPD who presented to the ER with complaints of dizziness and left-sided weakness. She developed left upper and lower extremity weakness, left facial numbness with headache, and mild slurring of speech. In the ER CT head was negative for anything acute. Pt's weakness and left facial droop have improved. Chest  view shows no active cardiopulmonary disease. Type of Study: Bedside swallow evaluation Diet Prior to this Study: NPO Temperature Spikes Noted: No Respiratory Status: Room air Behavior/Cognition: Alert;Cooperative;Pleasant mood Oral Cavity - Dentition: Dentures, not available Self-Feeding Abilities: Able to feed self Patient Positioning: Upright in bed Baseline Vocal Quality: Clear Volitional Cough: Strong Volitional Swallow: Able to elicit    Oral/Motor/Sensory Function Overall Oral Motor/Sensory Function: Appears within functional limits for tasks assessed   Ice Chips     Thin Liquid Thin Liquid: Within functional  limits Presentation: Cup;Self Fed;Straw    Nectar Thick     Honey Thick     Puree Puree: Within functional limits Presentation: Spoon;Self Fed   Solid   GO    Solid: Within functional limits Presentation: Self Barbaraann Barthel 03/14/2014,9:59 AM

## 2014-03-14 NOTE — Progress Notes (Signed)
*  PRELIMINARY RESULTS* Echocardiogram 2D Echocardiogram has been performed.  [name] 03/14/2014, 12:10 PM

## 2014-03-14 NOTE — Progress Notes (Signed)
PT Cancellation Note  Patient Details Name: [name] MRN: [id number] DOB: 1960/09/25   Cancelled Treatment:    Reason Eval/Treat Not Completed: Patient at procedure or test/unavailable. Pt off floor at MRI. PT to return as able.   Kingsley Callander 03/14/2014, 8:05 AM  Kittie Plater, PT, DPT Pager #: [phone] Office #: [phone]

## 2014-03-14 NOTE — Progress Notes (Addendum)
[facility] PROGRESS NOTE  [name] CXK:[id number] DOB: [birth year] DOA: 03/13/2014  PCP: Lujean Amel, MD  Brief HPI: 53yo with PMH as below presented with left sided weakness and dizziness. Also had left facial droop. Also had reproducible chest pain.  Past medical history:  Past Medical History  Diagnosis Date  . Drug addiction clean for 18 months    cocaine  . Diabetes mellitus   . Diverticulosis   . Internal hemorrhoids   . Depression   . Diabetic neuropathy   . GERD (gastroesophageal reflux disease)   . Fibromyalgia   . Hyperlipidemia   . Rheumatoid arthritis(714.0)   . Anxiety   . Arthritis   . PONV (postoperative nausea and vomiting)   . Wears glasses   . Full dentures   . COPD (chronic obstructive pulmonary disease)    Consultants: Neurology  Procedures:  2D ECHO and carotid Dopplers pending  Antibiotics: None  Subjective: Patient still feels numbness in left face. Still has left sided weakness. Also mentions difficulty swallowing with coughing and choking spells occasionally.  Objective: Vital Signs  Filed Vitals:   03/14/14 0100 03/14/14 0300 03/14/14 0500 03/14/14 0600  BP: 111/54 101/43 103/48 117/65  Pulse: 76 82 83 79  Temp: 99 F (37.2 C) 98.6 F (37 C) 98.6 F (37 C) 98.4 F (36.9 C)  TempSrc: Oral Oral Oral Oral  Resp: 18 20 18 18   Height:      Weight:      SpO2: 100% 97% 98% 97%   No intake or output data in the 24 hours ending 03/14/14 0747 Filed Weights   03/13/14 2310  Weight: 59.603 kg (131 lb 6.4 oz)    General appearance: alert, cooperative, appears stated age and no distress Resp: clear to auscultation bilaterally Cardio: regular rate and rhythm, S1, S2 normal, no murmur, click, rub or gallop GI: soft, non-tender; bowel sounds normal; no masses,  no organomegaly Neurologic: Alert and oriented x 3. No obvious facial droop. Left sided weakness appreciated in upper and lower extremities. 4-5/5.  Lab  Results:  Basic Metabolic Panel:  Recent Labs Lab 03/13/14 2052 03/14/14 0117  NA 139 141  K 4.3 3.9  CL 100 103  CO2 22 22  GLUCOSE 169* 115*  BUN 18 17  CREATININE 0.93 0.95  CALCIUM 9.6 9.5   Liver Function Tests:  Recent Labs Lab 03/13/14 2052 03/14/14 0117  AST 28 21  ALT 26 23  ALKPHOS 67 62  BILITOT <0.2* 0.2*  PROT 7.6 7.1  ALBUMIN 4.1 3.9   CBC:  Recent Labs Lab 03/13/14 2052 03/14/14 0117  WBC 9.6 10.4  NEUTROABS  --  4.5  HGB 14.9 13.9  HCT 42.5 40.4  MCV 86.4 86.1  PLT 187 191   Cardiac Enzymes:  Recent Labs Lab 03/14/14 0117  TROPONINI <0.30   CBG:  Recent Labs Lab 03/13/14 2039 03/14/14 0044 03/14/14 0524  GLUCAP 169* 120* 208*     Studies/Results: Dg Chest 2 View  03/13/2014   CLINICAL DATA:  Shortness of breath and cough today. Left sided chest pain.  EXAM: CHEST  2 VIEW  COMPARISON:  06/16/2013  FINDINGS: The heart size and mediastinal contours are within normal limits. Both lungs are clear. The visualized skeletal structures are unremarkable.  IMPRESSION: No active cardiopulmonary disease.   Electronically Signed   By: Rozetta Nunnery M.D.   On: 03/13/2014 22:13   Ct Head Wo Contrast  03/13/2014   CLINICAL DATA:  Code stroke. Left-sided facial  numbness and tingling. Left arm weakness. Dizziness, headache.  EXAM: CT HEAD WITHOUT CONTRAST  TECHNIQUE: Contiguous axial images were obtained from the base of the skull through the vertex without intravenous contrast.  COMPARISON:  None.  FINDINGS: There is no intra or extra-axial fluid collection or mass lesion. The basilar cisterns and ventricles have a normal appearance. There is no CT evidence for acute infarction or hemorrhage. Bone windows are negative.  IMPRESSION: 1. No evidence for acute intracranial abnormality. 2. Critical Value/emergent results were called by telephone at the time of interpretation on 03/13/2014 at 9:04 pm to Dr. Evelina Bucy , who verbally acknowledged these  results.   Electronically Signed   By: Shon Hale M.D.   On: 03/13/2014 21:05    Medications:  Scheduled: .  stroke: mapping our early stages of recovery book   Does not apply Once  . enoxaparin (LOVENOX) injection  40 mg Subcutaneous Q24H  . insulin aspart  0-9 Units Subcutaneous TID WC  . insulin glargine  14 Units Subcutaneous QHS  . LORazepam      . mometasone-formoterol  2 puff Inhalation BID  . pantoprazole  40 mg Oral Daily   Continuous: . sodium chloride 75 mL/hr at 03/14/14 0121   SNK:[id number], acetaminophen, albuterol, cyclobenzaprine, ondansetron, senna-docusate, traMADol  Assessment/Plan:  Active Problems:   Stroke   Dizziness   Diabetes mellitus type 2, uncontrolled   COPD (chronic obstructive pulmonary disease)   Chest pain, atypical    Possible Acute CVA with dysphagia Patient's symptoms are concerning for CVA. MRI and MRA are pending. Neurology following. Patient has failed swallow evaluation. Speech therapist consulted with PT and OT. 2-D echo, carotid Doppler. Was taking aspirin at home. May need alternative agents if she did have a stroke. LDL 139. Will need statin. Will start when she is able to take orally.  Diabetes mellitus type 2 Since patient is n.p.o. her insulin pump has been discontinued. Patient usually takes basal insulin rate of 2 units from 10 AM to 12 AM and 1.6 units from 12 AM to 10 AM. At this time she has been placed on Lantus insulin 14 units subcutaneous at bedtime with sliding scale coverage. Closely follow CBGs trends. Once patient starts eating patient has to be placed back on insulin pump.  History of COPD Currently not wheezing. Continue inhalers.  Atypical chest pain Cycle cardiac markers. Follow up on 2-D echo. Patient is on aspirin. EKG did not show acute ischemic changes. Old changes were seen.   DVT Prophylaxis: Enoxaparin    Code Status: Full Code  Family Communication: Discussed with patient  Disposition Plan:  Stroke w/u in progress    LOS: 1 day   [facility] Pager [phone] 03/14/2014, 7:47 AM  If 8PM-8AM, please contact night-coverage at [email or web address], password [email or web address]   Disclaimer: This note was dictated with voice recognition software. Similar sounding words can inadvertently be transcribed and may not be corrected upon review.

## 2014-03-14 NOTE — Evaluation (Signed)
Speech Language Pathology Evaluation Patient Details Name: [name] MRN: [id number] DOB: September 25, 1960 Today's Date: 03/14/2014 Time: 4709-6283 SLP Time Calculation (min): 25 min  Problem List:  Patient Active Problem List   Diagnosis Date Noted  . Dysphagia, unspecified(787.20) 03/14/2014  . Stroke 03/13/2014  . Dizziness 03/13/2014  . Diabetes mellitus type 2, uncontrolled 03/13/2014  . COPD (chronic obstructive pulmonary disease) 03/13/2014  . Chest pain, atypical 03/13/2014  . Low back pain radiating to right lower extremity 03/02/2014   Past Medical History:  Past Medical History  Diagnosis Date  . Drug addiction clean for 18 months    cocaine  . Diabetes mellitus   . Diverticulosis   . Internal hemorrhoids   . Depression   . Diabetic neuropathy   . GERD (gastroesophageal reflux disease)   . Fibromyalgia   . Hyperlipidemia   . Rheumatoid arthritis(714.0)   . Anxiety   . Arthritis   . PONV (postoperative nausea and vomiting)   . Wears glasses   . Full dentures   . COPD (chronic obstructive pulmonary disease)    Past Surgical History:  Past Surgical History  Procedure Laterality Date  . Cataract extraction  2009    bilateral  . Appendectomy    . Cesarean section    . Hernia repair      incisional  . Rotatator cuff repair  2001    left  . Dental surgery  2011    all teeth removed  . Tubal ligation  2002  . Tonsillectomy    . Knee arthroscopy  Right  . Knee arthroscopy      lt and rt-2014  . Eye surgery  08,09    both cataracts  . Trigger finger release Right 12/20/2013    Procedure: RIGHT THUMB TRIGGER  RELEASE ;  Surgeon: Tennis Must, MD;  Location: [facility];  Service: Orthopedics;  Laterality: Right;   HPI:  [name] is a 53 y.o. female with history of diabetes mellitus, COPD who presented to the ER with complaints of dizziness and left-sided weakness. She developed left upper and lower extremity weakness, left facial  numbness with headache, and mild slurring of speech. In the ER CT head was negative for anything acute. Pt's weakness and left facial droop have improved. Chest view shows no active cardiopulmonary disease.   Assessment / Plan / Recommendation Clinical Impression  Pt within functional limits for all cognitive linguistic tasks assessed. No therapy needed, SLP signing off.    SLP Assessment  Patient does not need any further Speech Lanaguage Pathology Services    Follow Up Recommendations       Frequency and Duration        Pertinent Vitals/Pain Pain Assessment: No/denies pain   SLP Goals     SLP Evaluation Prior Functioning  Cognitive/Linguistic Baseline: Within functional limits Type of Home: House Vocation: Retired   Associate Professor  Overall Cognitive Status: Within Functional Limits for tasks assessed Arousal/Alertness: Awake/alert Orientation Level: Oriented X4    Comprehension  Auditory Comprehension Overall Auditory Comprehension: Appears within functional limits for tasks assessed    Expression Expression Primary Mode of Expression: Verbal Verbal Expression Overall Verbal Expression: Appears within functional limits for tasks assessed   Oral / Motor Oral Motor/Sensory Function Overall Oral Motor/Sensory Function: Appears within functional limits for tasks assessed Motor Speech Overall Motor Speech: Appears within functional limits for tasks assessed   GO     Eden Emms 03/14/2014, 10:10 AM

## 2014-03-14 NOTE — Evaluation (Signed)
Supervised and reviewed by Ruta Capece MA CCC-SLP  

## 2014-03-14 NOTE — Progress Notes (Signed)
VASCULAR LAB PRELIMINARY  PRELIMINARY  PRELIMINARY  PRELIMINARY  Carotid Dopplers completed.    Preliminary report:  1-39% ICA stenosis.  Vertebral artery flow is antegrade.   Glada Wickstrom, RVT 03/14/2014, 12:00 PM

## 2014-03-14 NOTE — Evaluation (Signed)
Occupational Therapy Evaluation Patient Details Name: [name] MRN: [id number] DOB: Sep 29, 1960 Today's Date: 03/14/2014    History of Present Illness [name] is a 53 y.o. female with history of diabetes mellitus, COPD presents to the ER with complaints of dizziness and left-sided weakness.   Clinical Impression   Pt admitted with above. Pt independent with ADLs, PTA. Feel pt will benefit from acute OT to increase independence prior to d/c.     Follow Up Recommendations  Home health OT;Supervision/Assistance-24 hour   Equipment Recommendations  None recommended by OT    Recommendations for Other Services       Precautions / Restrictions Precautions Precautions: Fall Restrictions Weight Bearing Restrictions: No      Mobility Bed Mobility   General bed mobility comments: not assessed  Transfers Overall transfer level: Needs assistance Equipment used: None Transfers: Sit to/from Stand Sit to Stand: Supervision         General transfer comment: cues for hand placement.         ADL Overall ADL's : Needs assistance/impaired     Grooming: Wash/dry face;Oral care;Applying deodorant;Min guard;Standing   Upper Body Bathing: Min guard;Standing           Lower Body Dressing: Min guard;Sit to/from stand   Toilet Transfer: Min guard;Ambulation;RW;Regular Toilet;Grab bars       Tub/ Shower Transfer: Min guard;Ambulation;Rolling walker   Functional mobility during ADLs: Min guard;Rolling walker General ADL Comments: Educated on safety tips for home (safe shoewear, use of bag on walker, sitting for LB ADLs and having walker in front when pulling up LB clothing, rugs, pets). Recommended someone be with pt for tub transfer and advised pt to use shower chair and not get down in tub. Recommended pt not grab towel rack at home.  Discussed using washcloth on walker handles due to arthritis.     Vision                     Perception     Praxis       Pertinent Vitals/Pain Pain Assessment: 0-10 Pain Score: 7  Pain Location: all over Pain Descriptors / Indicators: Constant Pain Intervention(s): Monitored during session;Repositioned     Hand Dominance Right   Extremity/Trunk Assessment Upper Extremity Assessment Upper Extremity Assessment: Overall WFL for tasks assessed LUE Deficits / Details: grossly 4/5   Lower Extremity Assessment Lower Extremity Assessment: Defer to PT evaluation LLE Deficits / Details: grossly 4/5   Cervical / Trunk Assessment Cervical / Trunk Assessment: Normal   Communication Communication Communication: No difficulties   Cognition Arousal/Alertness: Awake/alert Behavior During Therapy: WFL for tasks assessed/performed Overall Cognitive Status: Within Functional Limits for tasks assessed                     General Comments       Exercises       Shoulder Instructions      Home Living Family/patient expects to be discharged to:: Private residence Living Arrangements: Alone Available Help at Discharge: Friend(s);Available PRN/intermittently (no one to provide 24/7) Type of Home: House Home Access: Ramped entrance (pt reports facility building a ramp for her)     Home Layout: One level     Bathroom Shower/Tub: Teacher, early years/pre: Standard     Home Equipment: Shower seat (believe she has walker)          Prior Functioning/Environment Level of Independence: Needs assistance  Gait / Transfers Assistance Needed: was using  neighbors rollator ADL's / Homemaking Assistance Needed: uses bus or has friends drive her to grocery shopping/appts        OT Diagnosis: Acute pain   OT Problem List: Decreased knowledge of use of DME or AE;Decreased knowledge of precautions;Pain;Decreased activity tolerance   OT Treatment/Interventions: Self-care/ADL training;DME and/or AE instruction;Therapeutic activities;Patient/family education;Balance training;Therapeutic  exercise    OT Goals(Current goals can be found in the care plan section) Acute Rehab OT Goals Patient Stated Goal: home OT Goal Formulation: With patient Time For Goal Achievement: 03/21/14 Potential to Achieve Goals: Good ADL Goals Pt Will Perform Lower Body Bathing: with modified independence;sit to/from stand Pt Will Perform Lower Body Dressing: with modified independence;sit to/from stand Pt Will Transfer to Toilet: with modified independence;ambulating;regular height toilet;grab bars  OT Frequency: Min 2X/week   Barriers to D/C: Decreased caregiver support          Co-evaluation              End of Session Equipment Utilized During Treatment: Gait belt;Rolling walker  Activity Tolerance: Patient tolerated treatment well (became dizzy) Patient left: in chair;with call bell/phone within reach   Time: 8101-7510 OT Time Calculation (min): 24 min Charges:  OT General Charges $OT Visit: 1 Procedure OT Evaluation $Initial OT Evaluation Tier I: 1 Procedure OT Treatments $Self Care/Home Management : 8-22 mins G-CodesBenito Bowman OTR/L [phone] 03/14/2014, 3:57 PM

## 2014-03-14 NOTE — Progress Notes (Signed)
STROKE TEAM PROGRESS NOTE   HISTORY [name] is an 53 y.o. female who reports that last evening she had the acute onset of dizziness. Desribes the dizziness as a vertigo. Was able to sleep overnight but felt weak today particualrly on the left. Describes a headaches that surrounds the head like a band. Also describes numbness on the left. Her friend noted a left facial droop. Patient was brought in as a code stroke. Droop has resolved but dizziness continues.  Date last known well: Date: 03/12/2014  Time last known well: Time: 22:00  tPA Given: No: Outside time window  SUBJECTIVE (INTERVAL HISTORY) No family is at the bedside.  Overall she feels her condition is gradually improving. She stated that she has a headache like a band on her head. She has dizziness which is off balance and lightheadedness but not room spinning. She had some chest pain and improved now. She also complains left face numbness and left arm weakness and left leg weakness, but on exam showed no facial numbness and effort related left arm and leg weakness with positive hoover's sign, indicating functional component. Noted pt also carries dx of cocaine use in the past, depression, fibromyalgia and anxiety.  OBJECTIVE Temp:  [98 F (36.7 C)-99 F (37.2 C)] 98.4 F (36.9 C) (09/14 1356) Pulse Rate:  [76-96] 82 (09/14 1356) Cardiac Rhythm:  [-] Normal sinus rhythm (09/14 0810) Resp:  [16-26] 18 (09/14 1356) BP: (96-146)/(33-89) 120/89 mmHg (09/14 1356) SpO2:  [96 %-100 %] 96 % (09/14 1356) Weight:  [131 lb 6.4 oz (59.603 kg)] 131 lb 6.4 oz (59.603 kg) (09/13 2310)   Recent Labs Lab 03/13/14 2039 03/14/14 0044 03/14/14 0524 03/14/14 0753 03/14/14 1123  GLUCAP 169* 120* 208* 186* 340*    Recent Labs Lab 03/13/14 2052 03/14/14 0117  NA 139 141  K 4.3 3.9  CL 100 103  CO2 22 22  GLUCOSE 169* 115*  BUN 18 17  CREATININE 0.93 0.95  CALCIUM 9.6 9.5    Recent Labs Lab 03/13/14 2052 03/14/14 0117  AST  28 21  ALT 26 23  ALKPHOS 67 62  BILITOT <0.2* 0.2*  PROT 7.6 7.1  ALBUMIN 4.1 3.9    Recent Labs Lab 03/13/14 2052 03/14/14 0117  WBC 9.6 10.4  NEUTROABS  --  4.5  HGB 14.9 13.9  HCT 42.5 40.4  MCV 86.4 86.1  PLT 187 191    Recent Labs Lab 03/14/14 0117 03/14/14 1255  TROPONINI <0.30 <0.30  <0.30   No results found for this basename: LABPROT, INR,  in the last 72 hours No results found for this basename: COLORURINE, APPERANCEUR, LABSPEC, PHURINE, GLUCOSEU, HGBUR, BILIRUBINUR, KETONESUR, PROTEINUR, UROBILINOGEN, NITRITE, LEUKOCYTESUR,  in the last 72 hours     Component Value Date/Time   CHOL 245* 03/14/2014 0117   TRIG 363* 03/14/2014 0117   HDL 33* 03/14/2014 0117   CHOLHDL 7.4 03/14/2014 0117   VLDL 73* 03/14/2014 0117   LDLCALC 139* 03/14/2014 0117   Lab Results  Component Value Date   HGBA1C 8.4* 03/14/2014      Component Value Date/Time   LABOPIA NONE DETECTED 03/13/2014 2120   COCAINSCRNUR NONE DETECTED 03/13/2014 2120   LABBENZ NONE DETECTED 03/13/2014 2120   AMPHETMU NONE DETECTED 03/13/2014 2120   THCU NONE DETECTED 03/13/2014 2120   LABBARB NONE DETECTED 03/13/2014 2120    No results found for this basename: ETH,  in the last 168 hours  Dg Chest 2 View  03/13/2014   IMPRESSION:  No active cardiopulmonary disease.      Ct Head Wo Contrast  03/13/2014  IMPRESSION: 1. No evidence for acute intracranial abnormality.    Mri and Mra Brain Wo Contrast  03/14/2014   IMPRESSION: Normal MRI of the brain.  Motion degraded MR angiography. Suspicion of a 3 mm aneurysm projecting upward from the left ICA bifurcation.      CUS - Bilateral: 1-39% ICA stenosis. Vertebral artery flow is antegrade.  2D echo - pending   PHYSICAL EXAM  Temp:  [98 F (36.7 C)-99 F (37.2 C)] 98.4 F (36.9 C) (09/14 1356) Pulse Rate:  [76-96] 82 (09/14 1356) Resp:  [16-26] 18 (09/14 1356) BP: (96-146)/(33-89) 120/89 mmHg (09/14 1356) SpO2:  [96 %-100 %] 96 % (09/14 1356) Weight:   [131 lb 6.4 oz (59.603 kg)] 131 lb 6.4 oz (59.603 kg) (09/13 2310)  General - Well nourished, well developed, in no apparent distress.  Ophthalmologic - Sharp disc margins OU.  Cardiovascular - Regular rate and rhythm with no murmur.  Mental Status -  Level of arousal and orientation to time, place, and person were intact. Language including expression, naming, repetition, comprehension was assessed and found intact.  Cranial Nerves II - XII - II - Visual field intact OU. III, IV, VI - Extraocular movements intact. V - Facial sensation intact bilaterally. VII - Facial movement intact bilaterally. VIII - Hearing & vestibular intact bilaterally. X - Palate elevates symmetrically. XI - Chin turning & shoulder shrug intact bilaterally. XII - Tongue protrusion intact.  Motor Strength - The patient's strength was normal at right UE and LE but effort dependent on the LUE and LLE, initially tend to drift on the left UE but with distraction no drift noticed. Hoover's sign positive indicating functional component.  Bulk was normal and fasciculations were absent.   Motor Tone - Muscle tone was assessed at the neck and appendages and was normal.  Reflexes - The patient's reflexes were normal in all extremities and she had no pathological reflexes.  Sensory - Light touch, temperature/pinprick were assessed and were normal.    Coordination - The patient had normal movements in both hands although left hand slower that right hand but no ataxia or dysmetria.  Tremor was absent.  Gait and Station - not tested due to transport is waiting to take her for tests.   ASSESSMENT/PLAN  Ms. [name] is a 53 y.o. female with history of DM, HLD, diabetic neuropathy, fibromyalgia, depression, anxiety and hx of cocaine use presenting with multiple neurological complains including dizziness, off balance, lightheadedness, left facial numbness, headache with band like, left arm and leg weakness, chest pain.  She did not receive IV t-PA due to out of window. Exam showed functional component. Imaging negative for acute infarct. Stroke work up underway.  Stroke rule out -    aspirin 81 mg orally every day prior to admission, now on aspirin 81 mg orally every day. Please continue antiplatelet with ASA for stroke prevention.  MRI no acute infarc  MRA  Possible 46mm aneurysm projecting upward from the left ICA bifurcation  Carotid Doppler unremarkable  2D Echo  pending   LDL 139, she said she had tried all the statins and causing her muscle pain. We will recommend fenofibrate or fish oil or niacin.    HgbA1c 8.4, indicating DM not in control  lovenox for VTE prophylaxis  Carb Control    Activity as tolerated  PT/OT evaluation  Low normal BP - BP lower than her  baseline BP - encourage po intake if able - IVF if constantly low normal range.    Hyperlipidemia  Home meds:  none. she said she had tried all the statins and causing her muscle pain. We will recommend fenofibrate or fish oil or niacin.   LDL 139  LDL goal <70 for diabetics  Diabetes  Home meds:  Insulin pump  HgbA1c 8.4   Uncontrolled  Goal < 7.0  educated patient about lifestyle changes for diabetes treatment  Manage per primary team  Possible 15mm aneurysm projecting upward from the left ICA bifurcation - follow up outpt with NSG - no acute intervention  Current smoker - smoking cessation counseling provided - pt understood  Other Stroke Risk Factors Cocaine use in the past  Other Active Problems  Tension headache - recommend tylenol or NSAIDS  Hospital day # 1  Neurology will sign off and please call with questions. Thank you for the consult  Rosalin Hawking, MD PhD Stroke Neurology 03/14/2014 5:01 PM    To contact Stroke Continuity provider, please refer to [email or web address] After hours, contact General Neurology

## 2014-03-14 NOTE — Evaluation (Signed)
Physical Therapy Evaluation Patient Details Name: [name] MRN: [id number] DOB: Jun 13, 1961 Today's Date: 03/14/2014   History of Present Illness  [name] is a 53 y.o. female with history of diabetes mellitus, COPD presents to the ER with complaints of dizziness and left-sided weakness.  Clinical Impression  Pt presenting with 6/10 "dizziness" (denies spinning of room, more lightheadedness) causing pt to have h/o falls at home. Pt requiring use of RW for safe ambulation at this time. When attempting to amb without RW pt very unsteady and reaching for objects to hold on to. Pt unsafe to d/c home alone at this time despite having supportive neighbors due to them not being available 24/7. Acute PT to follow to progress mobility to safe mod I in hopes for pt to be able to d/c home alone.    Follow Up Recommendations Home health PT;Supervision/Assistance - 24 hour (may need SNF if can't achieve mod I function)    Equipment Recommendations  Rolling walker with 5" wheels (rollator, pt 4'11")    Recommendations for Other Services       Precautions / Restrictions Precautions Precautions: Fall Restrictions Weight Bearing Restrictions: No      Mobility  Bed Mobility Overal bed mobility: Needs Assistance Bed Mobility: Supine to Sit     Supine to sit: Supervision     General bed mobility comments: HOB elevated, use of bed rail  Transfers Overall transfer level: Needs assistance Equipment used: None Transfers: Sit to/from Stand Sit to Stand: Min assist (supervision if using a RW)         General transfer comment: initial stand without walker pt required minA due to instability due to lightheadedness, pt reaching for something to hold onto  Ambulation/Gait Ambulation/Gait assistance: Min assist Ambulation Distance (Feet): 150 Feet Assistive device: None;Rolling walker (2 wheeled) Gait Pattern/deviations: Step-through pattern;Decreased stride length;Shuffle Gait  velocity: slow   General Gait Details: attempted to amb without RW pt requiring minA to maintain balance due to lightheadedness. once given RW pt with improved stability despite dizziness requiring min guard assist, v/c's for walker management  Stairs            Wheelchair Mobility    Modified Rankin (Stroke Patients Only) Modified Rankin (Stroke Patients Only) Pre-Morbid Rankin Score: Slight disability Modified Rankin: Moderate disability     Balance Overall balance assessment: Needs assistance;History of Falls         Standing balance support: No upper extremity supported Standing balance-Leahy Scale: Poor Standing balance comment: pt unsteady due to dizziness                             Pertinent Vitals/Pain Pain Assessment: 0-10 Pain Score: 5  Pain Location: headache Pain Descriptors / Indicators: Constant Pain Intervention(s): Monitored during session    Home Living Family/patient expects to be discharged to:: Private residence Living Arrangements: Alone Available Help at Discharge: Friend(s);Available PRN/intermittently (no one to provide 24/7) Type of Home: House Home Access: Ramped entrance (pt reports facility building a ramp for her)     Home Layout: One level Home Equipment: None      Prior Function Level of Independence: Needs assistance   Gait / Transfers Assistance Needed: was using neighbors rollator  ADL's / Homemaking Assistance Needed: uses bus or has friends drive her to grocery shopping/appts        Hand Dominance   Dominant Hand: Right    Extremity/Trunk Assessment   Upper Extremity Assessment:  LUE deficits/detail       LUE Deficits / Details: grossly 4/5   Lower Extremity Assessment: LLE deficits/detail   LLE Deficits / Details: grossly 4/5  Cervical / Trunk Assessment: Normal  Communication   Communication: No difficulties  Cognition Arousal/Alertness: Awake/alert Behavior During Therapy: WFL for tasks  assessed/performed Overall Cognitive Status: Within Functional Limits for tasks assessed                      General Comments      Exercises        Assessment/Plan    PT Assessment Patient needs continued PT services  PT Diagnosis Generalized weakness;Difficulty walking   PT Problem List Decreased strength;Decreased activity tolerance;Decreased balance;Decreased mobility  PT Treatment Interventions DME instruction;Gait training;Functional mobility training;Therapeutic activities;Therapeutic exercise;Balance training;Neuromuscular re-education   PT Goals (Current goals can be found in the Care Plan section) Acute Rehab PT Goals Patient Stated Goal: home PT Goal Formulation: With patient Time For Goal Achievement: 03/21/14 Potential to Achieve Goals: Good    Frequency Min 3X/week   Barriers to discharge Decreased caregiver support only has help PRN    Co-evaluation               End of Session Equipment Utilized During Treatment: Gait belt Activity Tolerance: Patient tolerated treatment well Patient left: in chair;with call bell/phone within reach Nurse Communication: Mobility status         Time: 1349-1410 PT Time Calculation (min): 21 min   Charges:   PT Evaluation $Initial PT Evaluation Tier I: 1 Procedure PT Treatments $Gait Training: 8-22 mins   PT G CodesKingsley Callander 03/14/2014, 3:13 PM  Kittie Plater, PT, DPT Pager #: [phone] Office #: [phone]

## 2014-03-15 DIAGNOSIS — I6350 Cerebral infarction due to unspecified occlusion or stenosis of unspecified cerebral artery: Secondary | ICD-10-CM | POA: Diagnosis not present

## 2014-03-15 DIAGNOSIS — IMO0001 Reserved for inherently not codable concepts without codable children: Secondary | ICD-10-CM | POA: Diagnosis not present

## 2014-03-15 LAB — CBC
HCT: 36.9 % (ref 36.0–46.0)
Hemoglobin: 12.8 g/dL (ref 12.0–15.0)
MCH: 29.9 pg (ref 26.0–34.0)
MCHC: 34.7 g/dL (ref 30.0–36.0)
MCV: 86.2 fL (ref 78.0–100.0)
Platelets: 140 10*3/uL — ABNORMAL LOW (ref 150–400)
RBC: 4.28 MIL/uL (ref 3.87–5.11)
RDW: 13.5 % (ref 11.5–15.5)
WBC: 7.8 10*3/uL (ref 4.0–10.5)

## 2014-03-15 LAB — GLUCOSE, CAPILLARY
GLUCOSE-CAPILLARY: 251 mg/dL — AB (ref 70–99)
Glucose-Capillary: 264 mg/dL — ABNORMAL HIGH (ref 70–99)
Glucose-Capillary: 299 mg/dL — ABNORMAL HIGH (ref 70–99)
Glucose-Capillary: 326 mg/dL — ABNORMAL HIGH (ref 70–99)

## 2014-03-15 LAB — BASIC METABOLIC PANEL
Anion gap: 14 (ref 5–15)
BUN: 23 mg/dL (ref 6–23)
CO2: 19 mEq/L (ref 19–32)
CREATININE: 0.93 mg/dL (ref 0.50–1.10)
Calcium: 8.6 mg/dL (ref 8.4–10.5)
Chloride: 102 mEq/L (ref 96–112)
GFR calc non Af Amer: 68 mL/min — ABNORMAL LOW (ref >90)
GFR, EST AFRICAN AMERICAN: 79 mL/min — AB (ref >90)
GLUCOSE: 267 mg/dL — AB (ref 70–99)
Potassium: 4.5 mEq/L (ref 3.7–5.3)
Sodium: 135 mEq/L — ABNORMAL LOW (ref 137–147)

## 2014-03-15 MED ORDER — MECLIZINE HCL 25 MG PO TABS: 25.0000 mg | ORAL_TABLET | Freq: Three times a day (TID) | ORAL | Status: DC | PRN

## 2014-03-15 NOTE — Progress Notes (Signed)
Pt transported to lobby per wc in no acute distress. No chest pain, no sob. Alert oriented. No new onset of weakness, vertigo. Slightly agitated with phone call from friend transporting her from lobby to home, feels rushed. Advised pt that there is no urgency involved in her discharge, and that all orders are completed. Pt refused sliding scale treatment for blood sugar, stated she would reactivate her insulin pump when at home.

## 2014-03-15 NOTE — Discharge Summary (Signed)
[facility]  Physician Discharge Summary   Patient ID: [name] MRN: [id number] DOB/AGE: [birth year] 55 y.o.  Admit date: 03/13/2014 Discharge date: 03/15/2014  PCP: Lujean Amel, MD  DISCHARGE DIAGNOSES:  Active Problems:   Stroke   Dizziness   Diabetes mellitus type 2, uncontrolled   COPD (chronic obstructive pulmonary disease)   Chest pain, atypical   Dysphagia, unspecified(787.20)   RECOMMENDATIONS FOR OUTPATIENT FOLLOW UP: 1. Needs follow up for suspected 81mm aneurysm seen on MRI brain.  2. Home health PT/OT arranged.  3. Consider alternative agents for high LDL.  DISCHARGE CONDITION: fair  Diet recommendation: Mod carb  Filed Weights   03/13/14 2310  Weight: 59.603 kg (131 lb 6.4 oz)    INITIAL HISTORY: 53yo with PMH as below presented with left sided weakness and dizziness. Also had left facial droop. Also had reproducible chest pain.   Past medical history:  Past Medical History   Diagnosis  Date   .  Drug addiction  clean for 18 months     cocaine   .  Diabetes mellitus    .  Diverticulosis    .  Internal hemorrhoids    .  Depression    .  Diabetic neuropathy    .  GERD (gastroesophageal reflux disease)    .  Fibromyalgia    .  Hyperlipidemia    .  Rheumatoid arthritis(714.0)    .  Anxiety    .  Arthritis    .  PONV (postoperative nausea and vomiting)    .  Wears glasses    .  Full dentures    .  COPD (chronic obstructive pulmonary disease)      Consultations:  Neurology  Procedures:  2D ECHO Study Conclusions - Left ventricle: The cavity size was normal. Wall thickness was normal. Systolic function was normal. The estimated ejection fraction was in the range of 55% to 60%. Doppler parameters are consistent with abnormal left ventricular relaxation (grade 1 diastolic dysfunction). The E/e&' ratio is between 8-15, suggesting indeterminate LV filling pressure.  - Left atrium: The atrium was normal in size. Impressions: - LVEF  55-60%, inadeaquate for wall motion abnormalities, diastolic dysfunction, indeterminate LV filling pressure.  Carotid Dopplers: 1-39% ICA stenosis. Vertebral artery flow is antegrade.   HOSPITAL COURSE:   Left Sided Weakness and Dysphagia  No stroke noted on MRI. Neurology suspected conversion disorder. Patient denies depression. No SI/HI. Seen by SLP and no dysphagia noted. She later tells me that she encounters this sometimes and not all the time. She has tolerated her food here. No feeling on food getting stuck in chest. She can follow up with her PCP for same. PT and OT saw her and HH will be arranged. Continue aspirin. LDL 139. She has not tolerated statins before. She will need to discuss this further with her PCP for alternative agents.   Suspected 80mm Aneurysm Left ICA Bifurcation PCP to follow up. Will likely need repeat MRI's. No immediate intervention needed.  Diabetes mellitus type 2  She can resume her insulin pump.   History of COPD  Currently not wheezing. Continue inhalers.   Atypical chest pain  Troponins were normal. ECHo as above. Her pain was reproducible with palpation. Now it has resolved. EKG did not show acute ischemic changes. Old changes were seen. Follow up with PCP.  She was told she did not have a stroke. She was asked to follow up with her PCP for further issues.   PERTINENT LABS: The results  of significant diagnostics from this hospitalization (including imaging, microbiology, ancillary and laboratory) are listed below for reference.     Labs: Basic Metabolic Panel:  Recent Labs Lab 03/13/14 2052 03/14/14 0117 03/15/14 0522  NA 139 141 135*  K 4.3 3.9 4.5  CL 100 103 102  CO2 22 22 19   GLUCOSE 169* 115* 267*  BUN 18 17 23   CREATININE 0.93 0.95 0.93  CALCIUM 9.6 9.5 8.6   Liver Function Tests:  Recent Labs Lab 03/13/14 2052 03/14/14 0117  AST 28 21  ALT 26 23  ALKPHOS 67 62  BILITOT <0.2* 0.2*  PROT 7.6 7.1  ALBUMIN 4.1 3.9    CBC:  Recent Labs Lab 03/13/14 2052 03/14/14 0117 03/15/14 0522  WBC 9.6 10.4 7.8  NEUTROABS  --  4.5  --   HGB 14.9 13.9 12.8  HCT 42.5 40.4 36.9  MCV 86.4 86.1 86.2  PLT 187 191 140*   Cardiac Enzymes:  Recent Labs Lab 03/14/14 0117 03/14/14 1255  TROPONINI <0.30 <0.30  <0.30   CBG:  Recent Labs Lab 03/14/14 1123 03/14/14 1619 03/14/14 2105 03/15/14 0025 03/15/14 0432  GLUCAP 340* 192* 296* 326* 264*    IMAGING STUDIES Dg Chest 2 View  03/13/2014   CLINICAL DATA:  Shortness of breath and cough today. Left sided chest pain.  EXAM: CHEST  2 VIEW  COMPARISON:  06/16/2013  FINDINGS: The heart size and mediastinal contours are within normal limits. Both lungs are clear. The visualized skeletal structures are unremarkable.  IMPRESSION: No active cardiopulmonary disease.   Electronically Signed   By: Rozetta Nunnery M.D.   On: 03/13/2014 22:13   Ct Head Wo Contrast  03/13/2014   CLINICAL DATA:  Code stroke. Left-sided facial numbness and tingling. Left arm weakness. Dizziness, headache.  EXAM: CT HEAD WITHOUT CONTRAST  TECHNIQUE: Contiguous axial images were obtained from the base of the skull through the vertex without intravenous contrast.  COMPARISON:  None.  FINDINGS: There is no intra or extra-axial fluid collection or mass lesion. The basilar cisterns and ventricles have a normal appearance. There is no CT evidence for acute infarction or hemorrhage. Bone windows are negative.  IMPRESSION: 1. No evidence for acute intracranial abnormality. 2. Critical Value/emergent results were called by telephone at the time of interpretation on 03/13/2014 at 9:04 pm to Dr. Evelina Bucy , who verbally acknowledged these results.   Electronically Signed   By: Shon Hale M.D.   On: 03/13/2014 21:05   Mr Brain Wo Contrast  03/14/2014   CLINICAL DATA:  Stroke.  Left-sided facial numbness and tingling.  EXAM: MRI HEAD WITHOUT CONTRAST  MRA HEAD WITHOUT CONTRAST  TECHNIQUE: Multiplanar,  multiecho pulse sequences of the brain and surrounding structures were obtained without intravenous contrast. Angiographic images of the head were obtained using MRA technique without contrast.  COMPARISON:  Head CT 03/13/2014  FINDINGS: MRI HEAD FINDINGS  The brain has a normal appearance on all pulse sequences without evidence of malformation, atrophy, old or acute infarction, mass lesion, hemorrhage, hydrocephalus or extra-axial collection. No pituitary mass. No fluid in the sinuses, middle ears or mastoids. No skull or skullbase lesion. There is flow in the major vessels at the base of the brain. Major venous sinuses show flow.  MRA HEAD FINDINGS  The study suffers from motion degradation. The anterior and middle cerebral vessels are patent. I suspect that there is a 3 mm aneurysm projecting upward from the ICA bifurcation on the left. Cannot state with certainty  given the motion artifact.  Both vertebral arteries are patent to the basilar. No basilar stenosis. Posterior circulation branch vessels are patent, with the left PCA arising from the anterior circulation.  IMPRESSION: Normal MRI of the brain.  Motion degraded MR angiography. Suspicion of a 3 mm aneurysm projecting upward from the left ICA bifurcation.   Electronically Signed   By: Nelson Chimes M.D.   On: 03/14/2014 09:46    Mr Jodene Nam Head/brain Wo Cm  03/14/2014   CLINICAL DATA:  Stroke.  Left-sided facial numbness and tingling.  EXAM: MRI HEAD WITHOUT CONTRAST  MRA HEAD WITHOUT CONTRAST  TECHNIQUE: Multiplanar, multiecho pulse sequences of the brain and surrounding structures were obtained without intravenous contrast. Angiographic images of the head were obtained using MRA technique without contrast.  COMPARISON:  Head CT 03/13/2014  FINDINGS: MRI HEAD FINDINGS  The brain has a normal appearance on all pulse sequences without evidence of malformation, atrophy, old or acute infarction, mass lesion, hemorrhage, hydrocephalus or extra-axial collection.  No pituitary mass. No fluid in the sinuses, middle ears or mastoids. No skull or skullbase lesion. There is flow in the major vessels at the base of the brain. Major venous sinuses show flow.  MRA HEAD FINDINGS  The study suffers from motion degradation. The anterior and middle cerebral vessels are patent. I suspect that there is a 3 mm aneurysm projecting upward from the ICA bifurcation on the left. Cannot state with certainty given the motion artifact.  Both vertebral arteries are patent to the basilar. No basilar stenosis. Posterior circulation branch vessels are patent, with the left PCA arising from the anterior circulation.  IMPRESSION: Normal MRI of the brain.  Motion degraded MR angiography. Suspicion of a 3 mm aneurysm projecting upward from the left ICA bifurcation.   Electronically Signed   By: Nelson Chimes M.D.   On: 03/14/2014 09:46    DISCHARGE EXAMINATION: Filed Vitals:   03/14/14 2054 03/15/14 0147 03/15/14 0600 03/15/14 0912  BP: 107/55 115/49 109/44   Pulse: 74 76 81   Temp: 99.3 F (37.4 C) 98.4 F (36.9 C) 98.1 F (36.7 C)   TempSrc: Oral Oral Oral   Resp: 17 18 18    Height:      Weight:      SpO2: 97% 95% 95% 95%   General appearance: alert, cooperative, appears stated age and no distress Resp: clear to auscultation bilaterally Cardio: regular rate and rhythm, S1, S2 normal, no murmur, click, rub or gallop  DISPOSITION: Home  Discharge Instructions   Call MD for:  difficulty breathing, headache or visual disturbances    Complete by:  As directed      Call MD for:  persistant nausea and vomiting    Complete by:  As directed      Call MD for:  severe uncontrolled pain    Complete by:  As directed      Call MD for:  temperature >100.4    Complete by:  As directed      Diet Carb Modified    Complete by:  As directed      Discharge instructions    Complete by:  As directed   Please follow up with your PCP by next week. Also follow up with your ENT doctor for  problems with ears. Your dizziness is probably related to the ear problem.     Increase activity slowly    Complete by:  As directed  ALLERGIES:  Allergies  Allergen Reactions  . Penicillins Rash    Childhood allergy  . Sulfa Antibiotics Hives and Shortness Of Breath  . Statins Other (See Comments)    Reaction: muscle pain  . Codeine Nausea Only    Can tolerate with pre-med  . Keflet [Cephalexin] Palpitations    Current Discharge Medication List    START taking these medications   Details  meclizine (ANTIVERT) 25 MG tablet Take 1 tablet (25 mg total) by mouth 3 (three) times daily as needed for dizziness. Qty: 30 tablet, Refills: 0      CONTINUE these medications which have NOT CHANGED   Details  albuterol (PROVENTIL HFA;VENTOLIN HFA) 108 (90 BASE) MCG/ACT inhaler Inhale 1 puff into the lungs every 6 (six) hours as needed for wheezing or shortness of breath.     aspirin 81 MG tablet Take 81 mg by mouth daily.    cyclobenzaprine (FLEXERIL) 10 MG tablet Take 1 tablet (10 mg total) by mouth 3 (three) times daily as needed for muscle spasms. Qty: 20 tablet, Refills: 0    fluticasone-salmeterol (ADVAIR HFA) 115-21 MCG/ACT inhaler Inhale 1 puff into the lungs daily.     insulin lispro (HUMALOG) 100 UNIT/ML injection Inject into the skin every morning. Insulin pump-base rate-    omeprazole (PRILOSEC) 20 MG capsule Take 20 mg by mouth daily.    promethazine (PHENERGAN) 25 MG tablet Take 25 mg by mouth every 6 (six) hours as needed for nausea.     traMADol (ULTRAM) 50 MG tablet Take 50 mg by mouth every 6 (six) hours as needed (pain).       Follow-up Information   Follow up with Lujean Amel, MD. Schedule an appointment as soon as possible for a visit in 4 days. (post hospitalization follow up)    Specialty:  Family Medicine   Contact information:   [place] [address] [phone]       TOTAL DISCHARGE TIME: 79  mins  [facility] Pager [phone]  03/15/2014, 9:30 AM  Disclaimer: This note was dictated with voice recognition software. Similar sounding words can inadvertently be transcribed and may not be corrected upon review.

## 2014-03-15 NOTE — Progress Notes (Signed)
Talked to patient about home health care choices, patient chose [facility] and only wants HHPT; [facility] called for arrangements; Aneta Mins [phone]

## 2014-03-15 NOTE — Discharge Instructions (Signed)
Talk to your doctor about further problems with swallowing.

## 2014-03-16 DIAGNOSIS — J449 Chronic obstructive pulmonary disease, unspecified: Secondary | ICD-10-CM | POA: Diagnosis not present

## 2014-03-16 DIAGNOSIS — Z5189 Encounter for other specified aftercare: Secondary | ICD-10-CM | POA: Diagnosis not present

## 2014-03-16 DIAGNOSIS — R42 Dizziness and giddiness: Secondary | ICD-10-CM | POA: Diagnosis not present

## 2014-03-16 DIAGNOSIS — I69952 Hemiplegia and hemiparesis following unspecified cerebrovascular disease affecting left dominant side: Secondary | ICD-10-CM | POA: Diagnosis not present

## 2014-03-16 DIAGNOSIS — E119 Type 2 diabetes mellitus without complications: Secondary | ICD-10-CM | POA: Diagnosis not present

## 2014-03-17 ENCOUNTER — Other Ambulatory Visit: Payer: Self-pay | Admitting: Internal Medicine

## 2014-03-17 DIAGNOSIS — G8190 Hemiplegia, unspecified affecting unspecified side: Secondary | ICD-10-CM

## 2014-03-17 DIAGNOSIS — Z09 Encounter for follow-up examination after completed treatment for conditions other than malignant neoplasm: Secondary | ICD-10-CM | POA: Diagnosis not present

## 2014-03-17 DIAGNOSIS — R531 Weakness: Secondary | ICD-10-CM

## 2014-03-17 DIAGNOSIS — R29810 Facial weakness: Secondary | ICD-10-CM

## 2014-03-17 DIAGNOSIS — Z7189 Other specified counseling: Secondary | ICD-10-CM | POA: Diagnosis not present

## 2014-03-17 DIAGNOSIS — R930 Abnormal findings on diagnostic imaging of skull and head, not elsewhere classified: Secondary | ICD-10-CM

## 2014-03-17 DIAGNOSIS — I671 Cerebral aneurysm, nonruptured: Secondary | ICD-10-CM | POA: Diagnosis not present

## 2014-03-17 DIAGNOSIS — G459 Transient cerebral ischemic attack, unspecified: Secondary | ICD-10-CM | POA: Diagnosis not present

## 2014-03-18 ENCOUNTER — Other Ambulatory Visit: Payer: Self-pay | Admitting: Internal Medicine

## 2014-03-18 DIAGNOSIS — I729 Aneurysm of unspecified site: Secondary | ICD-10-CM

## 2014-03-18 DIAGNOSIS — R930 Abnormal findings on diagnostic imaging of skull and head, not elsewhere classified: Secondary | ICD-10-CM

## 2014-03-18 DIAGNOSIS — R531 Weakness: Secondary | ICD-10-CM

## 2014-03-18 DIAGNOSIS — G8190 Hemiplegia, unspecified affecting unspecified side: Secondary | ICD-10-CM

## 2014-03-18 DIAGNOSIS — R29810 Facial weakness: Secondary | ICD-10-CM

## 2014-03-21 DIAGNOSIS — J449 Chronic obstructive pulmonary disease, unspecified: Secondary | ICD-10-CM | POA: Diagnosis not present

## 2014-03-21 DIAGNOSIS — R42 Dizziness and giddiness: Secondary | ICD-10-CM | POA: Diagnosis not present

## 2014-03-21 DIAGNOSIS — Z5189 Encounter for other specified aftercare: Secondary | ICD-10-CM | POA: Diagnosis not present

## 2014-03-21 DIAGNOSIS — E119 Type 2 diabetes mellitus without complications: Secondary | ICD-10-CM | POA: Diagnosis not present

## 2014-03-21 DIAGNOSIS — I69952 Hemiplegia and hemiparesis following unspecified cerebrovascular disease affecting left dominant side: Secondary | ICD-10-CM | POA: Diagnosis not present

## 2014-03-22 DIAGNOSIS — H9209 Otalgia, unspecified ear: Secondary | ICD-10-CM | POA: Diagnosis not present

## 2014-03-22 DIAGNOSIS — H6120 Impacted cerumen, unspecified ear: Secondary | ICD-10-CM | POA: Diagnosis not present

## 2014-03-23 DIAGNOSIS — E119 Type 2 diabetes mellitus without complications: Secondary | ICD-10-CM | POA: Diagnosis not present

## 2014-03-23 DIAGNOSIS — I69952 Hemiplegia and hemiparesis following unspecified cerebrovascular disease affecting left dominant side: Secondary | ICD-10-CM | POA: Diagnosis not present

## 2014-03-23 DIAGNOSIS — R42 Dizziness and giddiness: Secondary | ICD-10-CM | POA: Diagnosis not present

## 2014-03-23 DIAGNOSIS — Z5189 Encounter for other specified aftercare: Secondary | ICD-10-CM | POA: Diagnosis not present

## 2014-03-23 DIAGNOSIS — J449 Chronic obstructive pulmonary disease, unspecified: Secondary | ICD-10-CM | POA: Diagnosis not present

## 2014-03-25 DIAGNOSIS — E119 Type 2 diabetes mellitus without complications: Secondary | ICD-10-CM | POA: Diagnosis not present

## 2014-03-25 DIAGNOSIS — J449 Chronic obstructive pulmonary disease, unspecified: Secondary | ICD-10-CM | POA: Diagnosis not present

## 2014-03-25 DIAGNOSIS — I69952 Hemiplegia and hemiparesis following unspecified cerebrovascular disease affecting left dominant side: Secondary | ICD-10-CM | POA: Diagnosis not present

## 2014-03-25 DIAGNOSIS — R42 Dizziness and giddiness: Secondary | ICD-10-CM | POA: Diagnosis not present

## 2014-03-25 DIAGNOSIS — Z5189 Encounter for other specified aftercare: Secondary | ICD-10-CM | POA: Diagnosis not present

## 2014-03-26 ENCOUNTER — Other Ambulatory Visit: Payer: Medicare Other

## 2014-03-28 DIAGNOSIS — I69952 Hemiplegia and hemiparesis following unspecified cerebrovascular disease affecting left dominant side: Secondary | ICD-10-CM | POA: Diagnosis not present

## 2014-03-28 DIAGNOSIS — E119 Type 2 diabetes mellitus without complications: Secondary | ICD-10-CM | POA: Diagnosis not present

## 2014-03-28 DIAGNOSIS — J449 Chronic obstructive pulmonary disease, unspecified: Secondary | ICD-10-CM | POA: Diagnosis not present

## 2014-03-28 DIAGNOSIS — Z5189 Encounter for other specified aftercare: Secondary | ICD-10-CM | POA: Diagnosis not present

## 2014-03-28 DIAGNOSIS — R42 Dizziness and giddiness: Secondary | ICD-10-CM | POA: Diagnosis not present

## 2014-03-29 DIAGNOSIS — R42 Dizziness and giddiness: Secondary | ICD-10-CM | POA: Diagnosis not present

## 2014-03-29 DIAGNOSIS — J449 Chronic obstructive pulmonary disease, unspecified: Secondary | ICD-10-CM | POA: Diagnosis not present

## 2014-03-29 DIAGNOSIS — I69952 Hemiplegia and hemiparesis following unspecified cerebrovascular disease affecting left dominant side: Secondary | ICD-10-CM | POA: Diagnosis not present

## 2014-03-29 DIAGNOSIS — Z5189 Encounter for other specified aftercare: Secondary | ICD-10-CM | POA: Diagnosis not present

## 2014-03-29 DIAGNOSIS — E119 Type 2 diabetes mellitus without complications: Secondary | ICD-10-CM | POA: Diagnosis not present

## 2014-03-30 ENCOUNTER — Ambulatory Visit
Admission: RE | Admit: 2014-03-30 | Discharge: 2014-03-30 | Disposition: A | Discharge status: Home / Self Care | Payer: Medicare Other | Source: Ambulatory Visit | Attending: Internal Medicine | Admitting: Internal Medicine

## 2014-03-30 DIAGNOSIS — I69952 Hemiplegia and hemiparesis following unspecified cerebrovascular disease affecting left dominant side: Secondary | ICD-10-CM | POA: Diagnosis not present

## 2014-03-30 DIAGNOSIS — R531 Weakness: Secondary | ICD-10-CM

## 2014-03-30 DIAGNOSIS — R42 Dizziness and giddiness: Secondary | ICD-10-CM | POA: Diagnosis not present

## 2014-03-30 DIAGNOSIS — I729 Aneurysm of unspecified site: Secondary | ICD-10-CM

## 2014-03-30 DIAGNOSIS — E119 Type 2 diabetes mellitus without complications: Secondary | ICD-10-CM | POA: Diagnosis not present

## 2014-03-30 DIAGNOSIS — I671 Cerebral aneurysm, nonruptured: Secondary | ICD-10-CM | POA: Diagnosis not present

## 2014-03-30 DIAGNOSIS — G8190 Hemiplegia, unspecified affecting unspecified side: Secondary | ICD-10-CM

## 2014-03-30 DIAGNOSIS — Z5189 Encounter for other specified aftercare: Secondary | ICD-10-CM | POA: Diagnosis not present

## 2014-03-30 DIAGNOSIS — R29810 Facial weakness: Secondary | ICD-10-CM

## 2014-03-30 DIAGNOSIS — R930 Abnormal findings on diagnostic imaging of skull and head, not elsewhere classified: Secondary | ICD-10-CM

## 2014-03-30 DIAGNOSIS — J449 Chronic obstructive pulmonary disease, unspecified: Secondary | ICD-10-CM | POA: Diagnosis not present

## 2014-04-01 NOTE — Progress Notes (Addendum)
OT Note - Addendum  March 16, 2014 1555  OT Visit Information  Last OT Received On 16-Mar-2014  OT G-codes **NOT FOR INPATIENT CLASS**  Functional Assessment Tool Used clinical judgement  Functional Limitation Self care  Self Care Current Status [phone]) CI  Self Care Goal Status [phone]) [place], OTR/L  [phone] 2014-03-16

## 2014-04-01 NOTE — Progress Notes (Signed)
03/14/14 0900  SLP G-Codes **NOT FOR INPATIENT CLASS**  Functional Assessment Tool Used (clinical judgement)  Functional Limitations Swallowing  Swallow Current Status (O5929) [place]  Swallow Goal Status (W4462) [facility]  Swallow Discharge Status (M6381) [place]  SLP Evaluations  $ SLP Speech Visit 1 Procedure  SLP Evaluations  $BSS Swallow 1 Procedure  $Swallowing Treatment 1 Procedure

## 2014-04-05 NOTE — Progress Notes (Signed)
Late entry for missed G-code. Based on review of the evaluation and goals by Kittie Plater, PT  04/11/14 1516  PT G-Codes **NOT FOR INPATIENT CLASS**  Functional Assessment Tool Used Clinical judgement based on chart review  Functional Limitation Mobility: Walking and moving around  Mobility: Walking and Moving Around Current Status [phone]) [place]  Mobility: Walking and Moving Around Goal Status [phone]) [place], [place]  [phone] 04/05/2014

## 2014-04-06 DIAGNOSIS — Z5189 Encounter for other specified aftercare: Secondary | ICD-10-CM | POA: Diagnosis not present

## 2014-04-06 DIAGNOSIS — J449 Chronic obstructive pulmonary disease, unspecified: Secondary | ICD-10-CM | POA: Diagnosis not present

## 2014-04-06 DIAGNOSIS — R42 Dizziness and giddiness: Secondary | ICD-10-CM | POA: Diagnosis not present

## 2014-04-06 DIAGNOSIS — E119 Type 2 diabetes mellitus without complications: Secondary | ICD-10-CM | POA: Diagnosis not present

## 2014-04-06 DIAGNOSIS — I69952 Hemiplegia and hemiparesis following unspecified cerebrovascular disease affecting left dominant side: Secondary | ICD-10-CM | POA: Diagnosis not present

## 2014-04-07 DIAGNOSIS — R42 Dizziness and giddiness: Secondary | ICD-10-CM | POA: Diagnosis not present

## 2014-04-07 DIAGNOSIS — Z5189 Encounter for other specified aftercare: Secondary | ICD-10-CM | POA: Diagnosis not present

## 2014-04-07 DIAGNOSIS — J449 Chronic obstructive pulmonary disease, unspecified: Secondary | ICD-10-CM | POA: Diagnosis not present

## 2014-04-07 DIAGNOSIS — I69952 Hemiplegia and hemiparesis following unspecified cerebrovascular disease affecting left dominant side: Secondary | ICD-10-CM | POA: Diagnosis not present

## 2014-04-07 DIAGNOSIS — E119 Type 2 diabetes mellitus without complications: Secondary | ICD-10-CM | POA: Diagnosis not present

## 2014-04-08 DIAGNOSIS — Z5189 Encounter for other specified aftercare: Secondary | ICD-10-CM | POA: Diagnosis not present

## 2014-04-08 DIAGNOSIS — J449 Chronic obstructive pulmonary disease, unspecified: Secondary | ICD-10-CM | POA: Diagnosis not present

## 2014-04-08 DIAGNOSIS — E119 Type 2 diabetes mellitus without complications: Secondary | ICD-10-CM | POA: Diagnosis not present

## 2014-04-08 DIAGNOSIS — I69952 Hemiplegia and hemiparesis following unspecified cerebrovascular disease affecting left dominant side: Secondary | ICD-10-CM | POA: Diagnosis not present

## 2014-04-08 DIAGNOSIS — R42 Dizziness and giddiness: Secondary | ICD-10-CM | POA: Diagnosis not present

## 2014-04-11 DIAGNOSIS — I671 Cerebral aneurysm, nonruptured: Secondary | ICD-10-CM | POA: Diagnosis not present

## 2014-04-11 DIAGNOSIS — Z6826 Body mass index (BMI) 26.0-26.9, adult: Secondary | ICD-10-CM | POA: Diagnosis not present

## 2014-04-12 DIAGNOSIS — Z5189 Encounter for other specified aftercare: Secondary | ICD-10-CM | POA: Diagnosis not present

## 2014-04-12 DIAGNOSIS — R42 Dizziness and giddiness: Secondary | ICD-10-CM | POA: Diagnosis not present

## 2014-04-12 DIAGNOSIS — E119 Type 2 diabetes mellitus without complications: Secondary | ICD-10-CM | POA: Diagnosis not present

## 2014-04-12 DIAGNOSIS — J449 Chronic obstructive pulmonary disease, unspecified: Secondary | ICD-10-CM | POA: Diagnosis not present

## 2014-04-12 DIAGNOSIS — I69952 Hemiplegia and hemiparesis following unspecified cerebrovascular disease affecting left dominant side: Secondary | ICD-10-CM | POA: Diagnosis not present

## 2014-04-13 DIAGNOSIS — J449 Chronic obstructive pulmonary disease, unspecified: Secondary | ICD-10-CM | POA: Diagnosis not present

## 2014-04-13 DIAGNOSIS — R42 Dizziness and giddiness: Secondary | ICD-10-CM | POA: Diagnosis not present

## 2014-04-13 DIAGNOSIS — I69952 Hemiplegia and hemiparesis following unspecified cerebrovascular disease affecting left dominant side: Secondary | ICD-10-CM | POA: Diagnosis not present

## 2014-04-13 DIAGNOSIS — E119 Type 2 diabetes mellitus without complications: Secondary | ICD-10-CM | POA: Diagnosis not present

## 2014-04-13 DIAGNOSIS — Z5189 Encounter for other specified aftercare: Secondary | ICD-10-CM | POA: Diagnosis not present

## 2014-04-14 ENCOUNTER — Other Ambulatory Visit: Payer: Self-pay | Admitting: Orthopedic Surgery

## 2014-04-14 DIAGNOSIS — Z9889 Other specified postprocedural states: Secondary | ICD-10-CM | POA: Diagnosis not present

## 2014-04-14 DIAGNOSIS — M25561 Pain in right knee: Secondary | ICD-10-CM | POA: Diagnosis not present

## 2014-04-14 DIAGNOSIS — M25562 Pain in left knee: Secondary | ICD-10-CM | POA: Diagnosis not present

## 2014-04-14 DIAGNOSIS — R222 Localized swelling, mass and lump, trunk: Secondary | ICD-10-CM

## 2014-04-18 ENCOUNTER — Ambulatory Visit
Admission: RE | Admit: 2014-04-18 | Discharge: 2014-04-18 | Disposition: A | Discharge status: Home / Self Care | Payer: Medicare Other | Source: Ambulatory Visit | Attending: Orthopedic Surgery | Admitting: Orthopedic Surgery

## 2014-04-18 DIAGNOSIS — R221 Localized swelling, mass and lump, neck: Secondary | ICD-10-CM | POA: Diagnosis not present

## 2014-04-18 DIAGNOSIS — R208 Other disturbances of skin sensation: Secondary | ICD-10-CM | POA: Diagnosis not present

## 2014-04-18 DIAGNOSIS — R222 Localized swelling, mass and lump, trunk: Secondary | ICD-10-CM | POA: Diagnosis not present

## 2014-04-18 DIAGNOSIS — R220 Localized swelling, mass and lump, head: Secondary | ICD-10-CM | POA: Diagnosis not present

## 2014-04-18 MED ORDER — IOHEXOL 300 MG/ML  SOLN
75.0000 mL | Freq: Once | INTRAMUSCULAR | Status: AC | PRN
  Administered 2014-04-18: 75 mL via INTRAVENOUS

## 2014-04-19 ENCOUNTER — Ambulatory Visit (INDEPENDENT_AMBULATORY_CARE_PROVIDER_SITE_OTHER): Payer: Medicare Other | Admitting: Neurology

## 2014-04-19 ENCOUNTER — Telehealth: Payer: Self-pay | Admitting: Neurology

## 2014-04-19 ENCOUNTER — Encounter: Payer: Self-pay | Admitting: Neurology

## 2014-04-19 VITALS — BP 128/68 | HR 77 | Temp 99.2°F | Ht 68.0 in | Wt 130.0 lb

## 2014-04-19 DIAGNOSIS — G44209 Tension-type headache, unspecified, not intractable: Secondary | ICD-10-CM | POA: Diagnosis not present

## 2014-04-19 DIAGNOSIS — Z720 Tobacco use: Secondary | ICD-10-CM | POA: Diagnosis not present

## 2014-04-19 DIAGNOSIS — I69952 Hemiplegia and hemiparesis following unspecified cerebrovascular disease affecting left dominant side: Secondary | ICD-10-CM | POA: Diagnosis not present

## 2014-04-19 DIAGNOSIS — Z5189 Encounter for other specified aftercare: Secondary | ICD-10-CM | POA: Diagnosis not present

## 2014-04-19 DIAGNOSIS — R42 Dizziness and giddiness: Secondary | ICD-10-CM | POA: Diagnosis not present

## 2014-04-19 DIAGNOSIS — R208 Other disturbances of skin sensation: Secondary | ICD-10-CM | POA: Diagnosis not present

## 2014-04-19 DIAGNOSIS — E119 Type 2 diabetes mellitus without complications: Secondary | ICD-10-CM | POA: Diagnosis not present

## 2014-04-19 DIAGNOSIS — R200 Anesthesia of skin: Secondary | ICD-10-CM

## 2014-04-19 DIAGNOSIS — F17200 Nicotine dependence, unspecified, uncomplicated: Secondary | ICD-10-CM

## 2014-04-19 DIAGNOSIS — J449 Chronic obstructive pulmonary disease, unspecified: Secondary | ICD-10-CM | POA: Diagnosis not present

## 2014-04-19 MED ORDER — METHOCARBAMOL 500 MG PO TABS: 500.0000 mg | ORAL_TABLET | Freq: Two times a day (BID) | ORAL | Status: DC | PRN

## 2014-04-19 NOTE — Patient Instructions (Addendum)
Please remember, common headache triggers are: sleep deprivation, dehydration, overheating, stress, hypoglycemia or skipping meals and blood sugar fluctuations, excessive pain medications or excessive alcohol use or caffeine withdrawal. Some people have food triggers such as aged cheese, orange juice or chocolate, especially dark chocolate, or MSG (monosodium glutamate). Try to avoid these headache triggers as much possible. It may be helpful to keep a headache diary to figure out what makes your headaches worse or brings them on and what alleviates them. Some people report headache onset after exercise but studies have shown that regular exercise may actually prevent headaches from coming. If you have exercise-induced headaches, please make sure that you drink plenty of fluid before and after exercising and that you do not over do it and do not overheat.  We will try robaxin 500 mg up to 2 times a day as needed. It can be sedating.

## 2014-04-19 NOTE — Telephone Encounter (Signed)
Patient calling to state that her methocarbamol (ROBAXIN) 500 MG tablet will need prior authorization and that we need to call this number: [phone].

## 2014-04-19 NOTE — Progress Notes (Signed)
Subjective:    Patient ID: [name] is a 53 y.o. female.  HPI    Star Age, MD, PhD [facility] [address], [address] [place],  [address]  Dear Corliss Blacker,   I saw your patient, [name], upon your kind request in my neurologic clinic today for initial consultation of her recurrent headaches. The patient is accompanied by her friend, Gentry Roch, today. As you know, [name] is a 53 year old right-handed woman with an underlying medical history of COPD (followed by Dr. Baird Lyons), hyperlipidemia, diabetes, history of diabetic neuropathy, depression, anxiety, arthritis (L rotator cuff surgery, R knee arthroscopic and L knee arthroscopic surgeries), fibromyalgia (Dr. Trudie Reed), reflux disease, diverticulosis, and previous cocaine use (none since 2011), who has had recurrent occipital HAs on the L and some associated L facial numbness. She has a remote history of migraines in her 37s and 30s and these were different in the past. She had pounding headaches at the time associated with photophobia, nausea and vomiting. She may have tried amitriptyline in the past. Of note she was recently hospitalized from 03/13/2014 to 03/15/2014 for sudden onset of left-sided facial droop and slurring of speech which started the night before, resolved and then restarted in the morning. She was not felt to be a TPA candidate. She was seen by neurology in consultation. I reviewed hospital records, the discharge summary, neurology consult note as well. She had a brain MRI without contrast as well as a brain MRA without contrast on 03/14/2014: Normal MRI of the brain. Motion degraded MR angiography. Suspicion of a 3 mm aneurysm projecting upward from the left ICA bifurcation.  In addition, I personally reviewed the images through the PACS system. She had a repeat brain MRI and MRA without contrast on 03/30/2014: 1. A 2.7 mm left ICA terminus aneurysm is  confirmed. There is no significant interval change. 2. Otherwise normal MRI appearance of the brain.  In addition, a personally reviewed the images through the PACS system. You have seen her on 04/11/2014 for consultation of what appeared to be an incidental finding of a small left ICA bifurcation aneurysm. She reports a recurrent headache for the past 6 months, daily for the last 2 months. She has tried gabapentin (for her FMS at the time) and meloxicam for her OA and she sees Dr. Veverly Fells for her arthritis. She tried robaxin, and Lyrica, which the latter caused SEs. She tried Robaxin for her knee muscle symptoms and that helped in the past. She has a tightness like sensation, non-throbbing, no associated N/V, no photophobia. She was told she had L TMJ per ENT.  She does not sleep well and used to take trazodone.  She felt flexeril made her "like jell-o". She has increased her tramadol to 4 times.  She has had L sided upper shoulder and neck muscle tenderness. She had a CT chest and soft tissue neck on 04/18/14 with no acute findings noted.  She denies snoring and does not have apneas or gasping.   Her Past Medical History Is Significant For: Past Medical History  Diagnosis Date  . Drug addiction clean for 18 months    cocaine  . Diabetes mellitus   . Diverticulosis   . Internal hemorrhoids   . Depression   . Diabetic neuropathy   . GERD (gastroesophageal reflux disease)   . Fibromyalgia   . Hyperlipidemia   . Rheumatoid arthritis(714.0)   . Anxiety   . Arthritis   . PONV (postoperative  nausea and vomiting)   . Wears glasses   . Full dentures   . COPD (chronic obstructive pulmonary disease)     Her Past Surgical History Is Significant For: Past Surgical History  Procedure Laterality Date  . Cataract extraction  2009    bilateral  . Appendectomy    . Cesarean section    . Hernia repair      incisional  . Rotatator cuff repair  2001    left  . Dental surgery  2011    all  teeth removed  . Tubal ligation  2002  . Tonsillectomy    . Knee arthroscopy  Right  . Knee arthroscopy      lt and rt-2014  . Eye surgery  08,09    both cataracts  . Trigger finger release Right 12/20/2013    Procedure: RIGHT THUMB TRIGGER  RELEASE ;  Surgeon: Tennis Must, MD;  Location: [facility];  Service: Orthopedics;  Laterality: Right;    Her Family History Is Significant For: Family History  Problem Relation Age of Onset  . Breast cancer Mother   . Breast cancer Maternal Aunt   . Colon cancer Maternal Grandfather   . Diabetes Paternal Grandmother   . Heart failure Maternal Grandmother     CHF    Her Social History Is Significant For: History   Social History  . Marital Status: Divorced    Spouse Name: N/A    Number of Children: 1  . Years of Education: colleg   Occupational History  . disabled    Social History Main Topics  . Smoking status: Current Some Day Smoker -- 0.50 packs/day    Types: Cigarettes  . Smokeless tobacco: Never Used  . Alcohol Use: No  . Drug Use: No     Comment: former cocaine addiction  . Sexual Activity: No   Other Topics Concern  . Not on file   Social History Narrative   Left handed and consumes 1-2 cups daily    Her Allergies Are:  Allergies  Allergen Reactions  . Penicillins Rash    Childhood allergy  . Sulfa Antibiotics Hives and Shortness Of Breath  . Statins Other (See Comments)    Reaction: muscle pain  . Codeine Nausea Only    Can tolerate with pre-med  . Keflet [Cephalexin] Palpitations  :   Her Current Medications Are:  Outpatient Encounter Prescriptions as of 04/19/2014  Medication Sig  . albuterol (PROVENTIL HFA;VENTOLIN HFA) 108 (90 BASE) MCG/ACT inhaler Inhale 1 puff into the lungs every 6 (six) hours as needed for wheezing or shortness of breath.   Marland Kitchen aspirin 81 MG tablet Take 81 mg by mouth daily.  . fluticasone-salmeterol (ADVAIR HFA) 115-21 MCG/ACT inhaler Inhale 1 puff into the lungs  daily.   . insulin lispro (HUMALOG) 100 UNIT/ML injection Inject into the skin every morning. Insulin pump-base rate-  . omeprazole (PRILOSEC) 20 MG capsule Take 20 mg by mouth daily.  . promethazine (PHENERGAN) 25 MG tablet Take 25 mg by mouth every 6 (six) hours as needed for nausea.   . traMADol (ULTRAM) 50 MG tablet Take 50 mg by mouth every 6 (six) hours as needed (pain).  . [DISCONTINUED] cyclobenzaprine (FLEXERIL) 10 MG tablet Take 1 tablet (10 mg total) by mouth 3 (three) times daily as needed for muscle spasms.  . [DISCONTINUED] meclizine (ANTIVERT) 25 MG tablet Take 1 tablet (25 mg total) by mouth 3 (three) times daily as needed for dizziness.  :  Review of Systems:  Out of a complete 14 point review of systems, all are reviewed and negative with the exception of these symptoms as listed below:    Review of Systems  Constitutional: Positive for fatigue.  HENT:       Trouble swallowing  Eyes:       Blurred vision  Respiratory: Positive for shortness of breath.   Musculoskeletal:       Joint pain , joint swelling  Skin: Positive for rash.  Neurological: Positive for dizziness, numbness and headaches.       Sleepiness  Psychiatric/Behavioral:       Anxiety,not enough sleep, decreased energy    Objective:  Neurologic Exam  Physical Exam Physical Examination:   Filed Vitals:   04/19/14 1312  BP: 128/68  Pulse: 77  Temp: 99.2 F (37.3 C)    General Examination: The patient is a very pleasant 53 y.o. female in no acute distress. She appears well-developed and well-nourished and well groomed.   HEENT: Normocephalic, atraumatic, pupils are equal, round and reactive to light and accommodation. Funduscopic exam is normal with sharp disc margins noted. Extraocular tracking is good without limitation to gaze excursion or nystagmus noted. Normal smooth pursuit is noted. Hearing is grossly intact. Tympanic membranes are clear bilaterally. Face is symmetric with normal facial  animation and normal facial sensation on the right with mild decrease in pinprick and light touch sensation in the left mid face. Speech is clear with no dysarthria noted. There is no hypophonia. There is no lip, neck/head, jaw or voice tremor. Neck is supple with full range of passive and active motion. There are no carotid bruits on auscultation. Oropharynx exam reveals: moderate mouth dryness, absent teeth, and mild airway crowding, due to elongated tongue. Mallampati is class II. Tongue protrudes centrally and palate elevates symmetrically. Tonsils are absent. She has mild tenderness in her posterior neck muscles and left upper shoulder area. She has no mass or cervical lymphadenopathy.   Chest: Clear to auscultation without wheezing, rhonchi or crackles noted.  Heart: S1+S2+0, regular and normal without murmurs, rubs or gallops noted.   Abdomen: Soft, non-tender and non-distended with normal bowel sounds appreciated on auscultation.  Extremities: There is no pitting edema in the distal lower extremities bilaterally. Pedal pulses are intact.  Skin: Warm and dry without trophic changes noted. There are no varicose veins.  Musculoskeletal: exam reveals no obvious joint deformities, tenderness or joint swelling or erythema.   Neurologically:  Mental status: The patient is awake, alert and oriented in all 4 spheres. Her immediate and remote memory, attention, language skills and fund of knowledge are appropriate. There is no evidence of aphasia, agnosia, apraxia or anomia. Speech is clear with normal prosody and enunciation. Thought process is linear. Mood is normal and affect is normal.  Cranial nerves II - XII are as described above under HEENT exam. In addition: shoulder shrug is normal with equal shoulder height noted. Motor exam: Normal bulk, strength and tone is noted. There is no drift, tremor or rebound. Romberg is negative. Reflexes are 2+ throughout. Babinski: Toes are flexor bilaterally.  Fine motor skills and coordination: intact with normal finger taps, normal hand movements, normal rapid alternating patting, normal foot taps and normal foot agility.  Cerebellar testing: No dysmetria or intention tremor on finger to nose testing. Heel to shin is unremarkable bilaterally. There is no truncal or gait ataxia.  Sensory exam: intact to light touch, pinprick, vibration, temperature sense in the upper and lower  extremities.  Gait, station and balance: She stands with mild difficulty. She has a mild degree of scoliosis. She walks cautiously. She does not feel that she can do tandem walk. She turns into steps.  Assessment and Plan:   In summary, Karyme Mcconathy is a very pleasant 53 y.o.-year old female with an underlying medical history of COPD (followed by Dr. Baird Lyons), hyperlipidemia, diabetes, history of diabetic neuropathy, depression, anxiety, arthritis (L rotator cuff surgery, R knee arthroscopic and L knee arthroscopic surgeries), fibromyalgia (Dr. Trudie Reed), reflux disease, diverticulosis, and previous cocaine use (none since 2011), who has had recurrent occipital HAs on the L and some associated L facial numbness. Her history and physical exam are in keeping with tension-type headaches. She may have cervicogenic headaches as well. She has a remote history of migraines but these headaches are different from her previous migraines. Her physical exam is largely nonfocal with the exception of left facial numbness which I really cannot explain very well at this time. I suggested that we observe her and treat her symptomatically with Robaxin. I advised her about potential sedating side effects. She has been on daily tramadol and medication overuse can also cause headaches I explained to her. She is advised to stay well-hydrated.  I had a long chat with the patient and Jana Half about my findings and the diagnosis of tension HAs, the prognosis and treatment options. We talked about medical  treatments and non-pharmacological approaches. We talked about maintaining a healthy lifestyle in general. I encouraged the patient to eat healthy, exercise daily and keep well hydrated, to keep a scheduled bedtime and wake time routine, to not skip any meals and eat healthy snacks in between meals and to have protein with every meal.   I advised the patient about common headache triggers: sleep deprivation, dehydration, overheating, stress, hypoglycemia or skipping meals and blood sugar fluctuations, excessive pain medications or excessive alcohol use or caffeine withdrawal. Some people have food triggers such as aged cheese, orange juice or chocolate, especially dark chocolate, or MSG (monosodium glutamate). She is to try to avoid these headache triggers as much possible. It may be helpful to keep a headache diary to figure out what makes Her headaches worse or brings them on and what alleviates them. Some people report headache onset after exercise but studies have shown that regular exercise may actually prevent headaches from coming. If She has exercise-induced headaches, She is advised to drink plenty of fluid before and after exercising and that to not overdo it and to not overheat. I did not suggest any additional testing today.   As far as medications are concerned, I recommended the following at this time: Robaxin 500 mg strength one pill up to twice daily as needed. I advised him not to take it every day if possible.  I answered all their questions today and the patient was in agreement with the above outlined plan. I would like to see the patient back in 3 months, sooner if the need arises and encouraged her to call with any interim questions, concerns, problems or updates.   Thank you very much for allowing me to participate in the care of this nice patient. If I can be of any further assistance to you please do not hesitate to call me at [phone].  Sincerely,   Star Age, MD, PhD

## 2014-04-19 NOTE — Telephone Encounter (Signed)
I called the number provided.  It was for [employer].  They requested to complete PA with forms online, and said we would need to obtain the ID number prior to doing this.  We do not have the patient's ID number for this ins plan.  I called the pharmacy.  Spoke with [employer].  She said the ID is [id number] Group: [id number].  Form has been completed and faxed.  Request is currently pending.  They will notify the patient of outcome once decision has been made.

## 2014-04-21 DIAGNOSIS — E119 Type 2 diabetes mellitus without complications: Secondary | ICD-10-CM | POA: Diagnosis not present

## 2014-04-21 DIAGNOSIS — I69952 Hemiplegia and hemiparesis following unspecified cerebrovascular disease affecting left dominant side: Secondary | ICD-10-CM | POA: Diagnosis not present

## 2014-04-21 DIAGNOSIS — R42 Dizziness and giddiness: Secondary | ICD-10-CM | POA: Diagnosis not present

## 2014-04-21 DIAGNOSIS — Z5189 Encounter for other specified aftercare: Secondary | ICD-10-CM | POA: Diagnosis not present

## 2014-04-21 DIAGNOSIS — J449 Chronic obstructive pulmonary disease, unspecified: Secondary | ICD-10-CM | POA: Diagnosis not present

## 2014-04-24 ENCOUNTER — Telehealth: Payer: Self-pay

## 2014-04-24 NOTE — Telephone Encounter (Signed)
[facility] notified us they have approved our request for coverage on Methocarbamol effective until 04/20/2015 Ref # [id number]

## 2014-04-28 DIAGNOSIS — R222 Localized swelling, mass and lump, trunk: Secondary | ICD-10-CM | POA: Diagnosis not present

## 2014-04-28 DIAGNOSIS — M25562 Pain in left knee: Secondary | ICD-10-CM | POA: Diagnosis not present

## 2014-04-28 DIAGNOSIS — Z9889 Other specified postprocedural states: Secondary | ICD-10-CM | POA: Diagnosis not present

## 2014-05-05 ENCOUNTER — Ambulatory Visit: Payer: Medicare Other

## 2014-05-06 DIAGNOSIS — F17210 Nicotine dependence, cigarettes, uncomplicated: Secondary | ICD-10-CM | POA: Diagnosis not present

## 2014-05-06 DIAGNOSIS — J449 Chronic obstructive pulmonary disease, unspecified: Secondary | ICD-10-CM | POA: Diagnosis not present

## 2014-05-06 DIAGNOSIS — M797 Fibromyalgia: Secondary | ICD-10-CM | POA: Diagnosis not present

## 2014-05-06 DIAGNOSIS — M069 Rheumatoid arthritis, unspecified: Secondary | ICD-10-CM | POA: Diagnosis not present

## 2014-05-06 DIAGNOSIS — M25561 Pain in right knee: Secondary | ICD-10-CM | POA: Diagnosis not present

## 2014-05-06 DIAGNOSIS — F418 Other specified anxiety disorders: Secondary | ICD-10-CM | POA: Diagnosis not present

## 2014-05-06 DIAGNOSIS — Z0000 Encounter for general adult medical examination without abnormal findings: Secondary | ICD-10-CM | POA: Diagnosis not present

## 2014-05-06 DIAGNOSIS — R51 Headache: Secondary | ICD-10-CM | POA: Diagnosis not present

## 2014-05-06 DIAGNOSIS — Z1389 Encounter for screening for other disorder: Secondary | ICD-10-CM | POA: Diagnosis not present

## 2014-05-09 ENCOUNTER — Encounter: Payer: Medicare Other | Attending: Internal Medicine | Admitting: *Deleted

## 2014-05-09 DIAGNOSIS — Z794 Long term (current) use of insulin: Secondary | ICD-10-CM | POA: Insufficient documentation

## 2014-05-09 DIAGNOSIS — E1165 Type 2 diabetes mellitus with hyperglycemia: Secondary | ICD-10-CM | POA: Insufficient documentation

## 2014-05-09 DIAGNOSIS — IMO0002 Reserved for concepts with insufficient information to code with codable children: Secondary | ICD-10-CM

## 2014-05-09 DIAGNOSIS — Z713 Dietary counseling and surveillance: Secondary | ICD-10-CM | POA: Diagnosis not present

## 2014-05-09 NOTE — Progress Notes (Signed)
  Pump Follow Up Progress Note on 05/09/14  Orders received from MD giving me permission to make insulin pump adjustments for the following patient.  Today's visit was to review [facility] reports for BG control assessment      Hypoglycemia Hyperglycemia Comments  Overnight Period:      Pre-Meal:    Breakfast YES  BASAL    Lunch YES  BASAL   Supper YES  BASAL   Post-Meal: Breakfast      Lunch      Supper     Bedtime:       Comments: Her Time and Date were incorrect, pump year was 2012, so that was corrected first. Reports had to be tracked back to 2012 to get the data needed for the actual past 2 weeks.  Average BG for past 2 weeks on the pump is 149 +/- 80 mg/dl but now having hypoglycemia throughout the day, unfortunately without symptoms now!  She is also having low BG after correcting high BG's so plan to decrease ISF as well as decreasing Basal Rates for all 24 hours per below.  She states she has not been feeling well, has not been eating much and is battling depression. She states she has been Rx'd an anti-depressant but has only been on it a couple of days, so has not seen any improvement yet.    Pump Settings: Target Range: 100-100.  Date: Current Date: 05/09/14  Changes in bold print   Basal Rate: Carb Ratio Sensitivity  Basal Rate: Carb Ratio Sensitivity   MN: 1.65 5.0 18 MN: 1.50  (-) 5.0 22  (-)  10 A 2.20   10 A 2.00  (-)                                                  Plan: You are doing a great job! We decreased your Basal Rates by 10% to help with some of the hypoglycemia you've been having when you don't eat adequately We decreased your Sensitivity Factor from 18 to 22 mg/dl as many of your low BG's are after you treat a high BG # Conitnue testing your BG 4 times a day, pre meal and at bedtime, especially since you are not getting symptoms for low BG anymore. Continue using the Bolus Wizard for meals and correction doses   Patient to continue to use Bolus Wizard  and change out pump every 3 days. I reminded her  to call me if BG below 70 or above 300 mg/dl. She will follow up with Dr. Buddy Duty in December and then with me in 3 months prior to her next 3 month appointment with Dr. Buddy Duty to evaluate these changes before her next A1c is drawn.

## 2014-05-09 NOTE — Patient Instructions (Signed)
Plan: You are doing a great job! We decreased your Basal Rates by 10% to help with some of the hypoglycemia you've been having when you don't eat adequately We decreased your Sensitivity Factor from 18 to 22 mg/dl as many of your low BG's are after you treat a high BG # Conitnue testing your BG 4 times a day, pre meal and at bedtime, especially since you are not getting symptoms for low BG anymore. Continue using the Bolus Wizard for meals and correction doses

## 2014-05-11 NOTE — Progress Notes (Signed)
Late entry to attempt to fix missing G-code.  DC status has now been added.     Apr 05, 2014 1516  PT G-Codes **NOT FOR INPATIENT CLASS**  Functional Assessment Tool Used Clinical judgement based on chart review  Functional Limitation Mobility: Walking and moving around  Mobility: Walking and Moving Around Current Status (N8295) CJ  Mobility: Walking and Moving Around Goal Status [phone]) CH  Mobility: Walking and Moving Around Discharge Status [phone]) Collier Bullock, [place] 05/11/2014

## 2014-05-17 DIAGNOSIS — S83241A Other tear of medial meniscus, current injury, right knee, initial encounter: Secondary | ICD-10-CM | POA: Diagnosis not present

## 2014-05-17 NOTE — Progress Notes (Signed)
Due to her recent poor appetite, I encouraged her to try [facility] Essentials Powder added to milk as a meal substitute that is less expensive that pre-bottles supplements. Provided her a sample to try and encouraged her to have one a day, even if she drinks only half at a time.

## 2014-05-23 DIAGNOSIS — E1169 Type 2 diabetes mellitus with other specified complication: Secondary | ICD-10-CM | POA: Diagnosis not present

## 2014-05-23 DIAGNOSIS — E782 Mixed hyperlipidemia: Secondary | ICD-10-CM | POA: Diagnosis not present

## 2014-05-23 DIAGNOSIS — Z01810 Encounter for preprocedural cardiovascular examination: Secondary | ICD-10-CM | POA: Diagnosis not present

## 2014-05-23 DIAGNOSIS — E1165 Type 2 diabetes mellitus with hyperglycemia: Secondary | ICD-10-CM | POA: Diagnosis not present

## 2014-05-30 DIAGNOSIS — E1165 Type 2 diabetes mellitus with hyperglycemia: Secondary | ICD-10-CM | POA: Diagnosis not present

## 2014-05-30 DIAGNOSIS — E1169 Type 2 diabetes mellitus with other specified complication: Secondary | ICD-10-CM | POA: Diagnosis not present

## 2014-05-30 DIAGNOSIS — Z01810 Encounter for preprocedural cardiovascular examination: Secondary | ICD-10-CM | POA: Diagnosis not present

## 2014-06-07 ENCOUNTER — Other Ambulatory Visit: Payer: Self-pay | Admitting: Neurology

## 2014-06-08 DIAGNOSIS — G8918 Other acute postprocedural pain: Secondary | ICD-10-CM | POA: Diagnosis not present

## 2014-06-08 DIAGNOSIS — Y929 Unspecified place or not applicable: Secondary | ICD-10-CM | POA: Diagnosis not present

## 2014-06-08 DIAGNOSIS — M94261 Chondromalacia, right knee: Secondary | ICD-10-CM | POA: Diagnosis not present

## 2014-06-08 DIAGNOSIS — S83231A Complex tear of medial meniscus, current injury, right knee, initial encounter: Secondary | ICD-10-CM | POA: Diagnosis not present

## 2014-06-08 DIAGNOSIS — M23321 Other meniscus derangements, posterior horn of medial meniscus, right knee: Secondary | ICD-10-CM | POA: Diagnosis not present

## 2014-06-08 DIAGNOSIS — M659 Synovitis and tenosynovitis, unspecified: Secondary | ICD-10-CM | POA: Diagnosis not present

## 2014-06-08 DIAGNOSIS — X58XXXA Exposure to other specified factors, initial encounter: Secondary | ICD-10-CM | POA: Diagnosis not present

## 2014-06-17 DIAGNOSIS — E1142 Type 2 diabetes mellitus with diabetic polyneuropathy: Secondary | ICD-10-CM | POA: Diagnosis not present

## 2014-06-29 DIAGNOSIS — Z136 Encounter for screening for cardiovascular disorders: Secondary | ICD-10-CM | POA: Diagnosis not present

## 2014-06-29 DIAGNOSIS — E1169 Type 2 diabetes mellitus with other specified complication: Secondary | ICD-10-CM | POA: Diagnosis not present

## 2014-06-29 DIAGNOSIS — E782 Mixed hyperlipidemia: Secondary | ICD-10-CM | POA: Diagnosis not present

## 2014-06-29 DIAGNOSIS — F17209 Nicotine dependence, unspecified, with unspecified nicotine-induced disorders: Secondary | ICD-10-CM | POA: Diagnosis not present

## 2014-07-02 DIAGNOSIS — J019 Acute sinusitis, unspecified: Secondary | ICD-10-CM | POA: Diagnosis not present

## 2014-07-02 DIAGNOSIS — J441 Chronic obstructive pulmonary disease with (acute) exacerbation: Secondary | ICD-10-CM | POA: Diagnosis not present

## 2014-07-18 DIAGNOSIS — M545 Low back pain: Secondary | ICD-10-CM | POA: Diagnosis not present

## 2014-07-18 DIAGNOSIS — M546 Pain in thoracic spine: Secondary | ICD-10-CM | POA: Diagnosis not present

## 2014-07-18 DIAGNOSIS — G603 Idiopathic progressive neuropathy: Secondary | ICD-10-CM | POA: Diagnosis not present

## 2014-07-18 DIAGNOSIS — G8929 Other chronic pain: Secondary | ICD-10-CM | POA: Diagnosis not present

## 2014-07-18 DIAGNOSIS — M5412 Radiculopathy, cervical region: Secondary | ICD-10-CM | POA: Diagnosis not present

## 2014-07-19 ENCOUNTER — Ambulatory Visit: Payer: Medicare Other | Admitting: Adult Health

## 2014-07-19 DIAGNOSIS — M542 Cervicalgia: Secondary | ICD-10-CM | POA: Diagnosis not present

## 2014-07-19 DIAGNOSIS — S83241D Other tear of medial meniscus, current injury, right knee, subsequent encounter: Secondary | ICD-10-CM | POA: Diagnosis not present

## 2014-07-28 DIAGNOSIS — M542 Cervicalgia: Secondary | ICD-10-CM | POA: Diagnosis not present

## 2014-07-28 DIAGNOSIS — F419 Anxiety disorder, unspecified: Secondary | ICD-10-CM | POA: Diagnosis not present

## 2014-07-28 IMAGING — CR DG CHEST 2V
2 series · 2 of 2 positions shown · non-contrast
Comparison: 06/30/2011 and 10/23/2009.

CLINICAL DATA: Shortness of breath.  On antibiotics for upper
respiratory infection.

CHEST - 2 VIEW

[w chest pa]
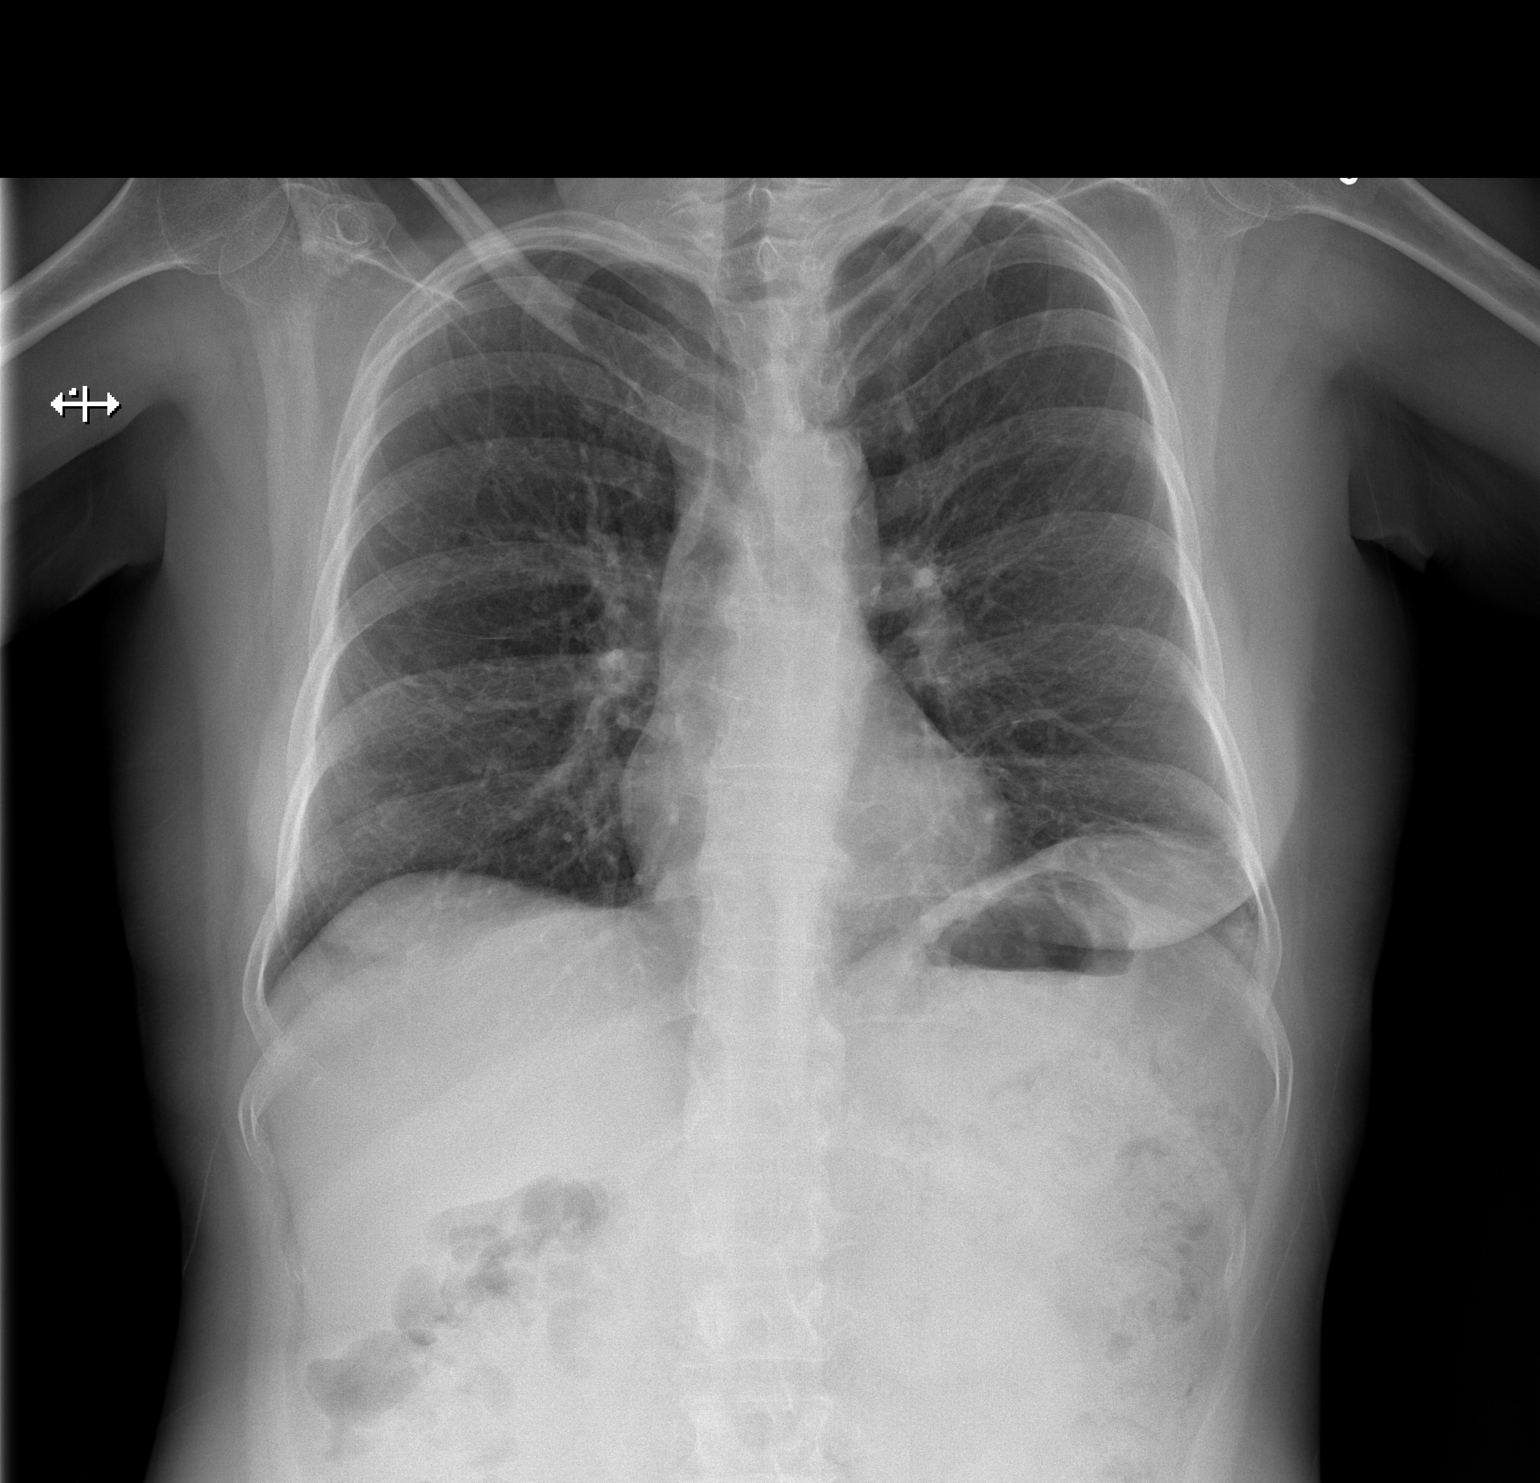

[w chest lat]
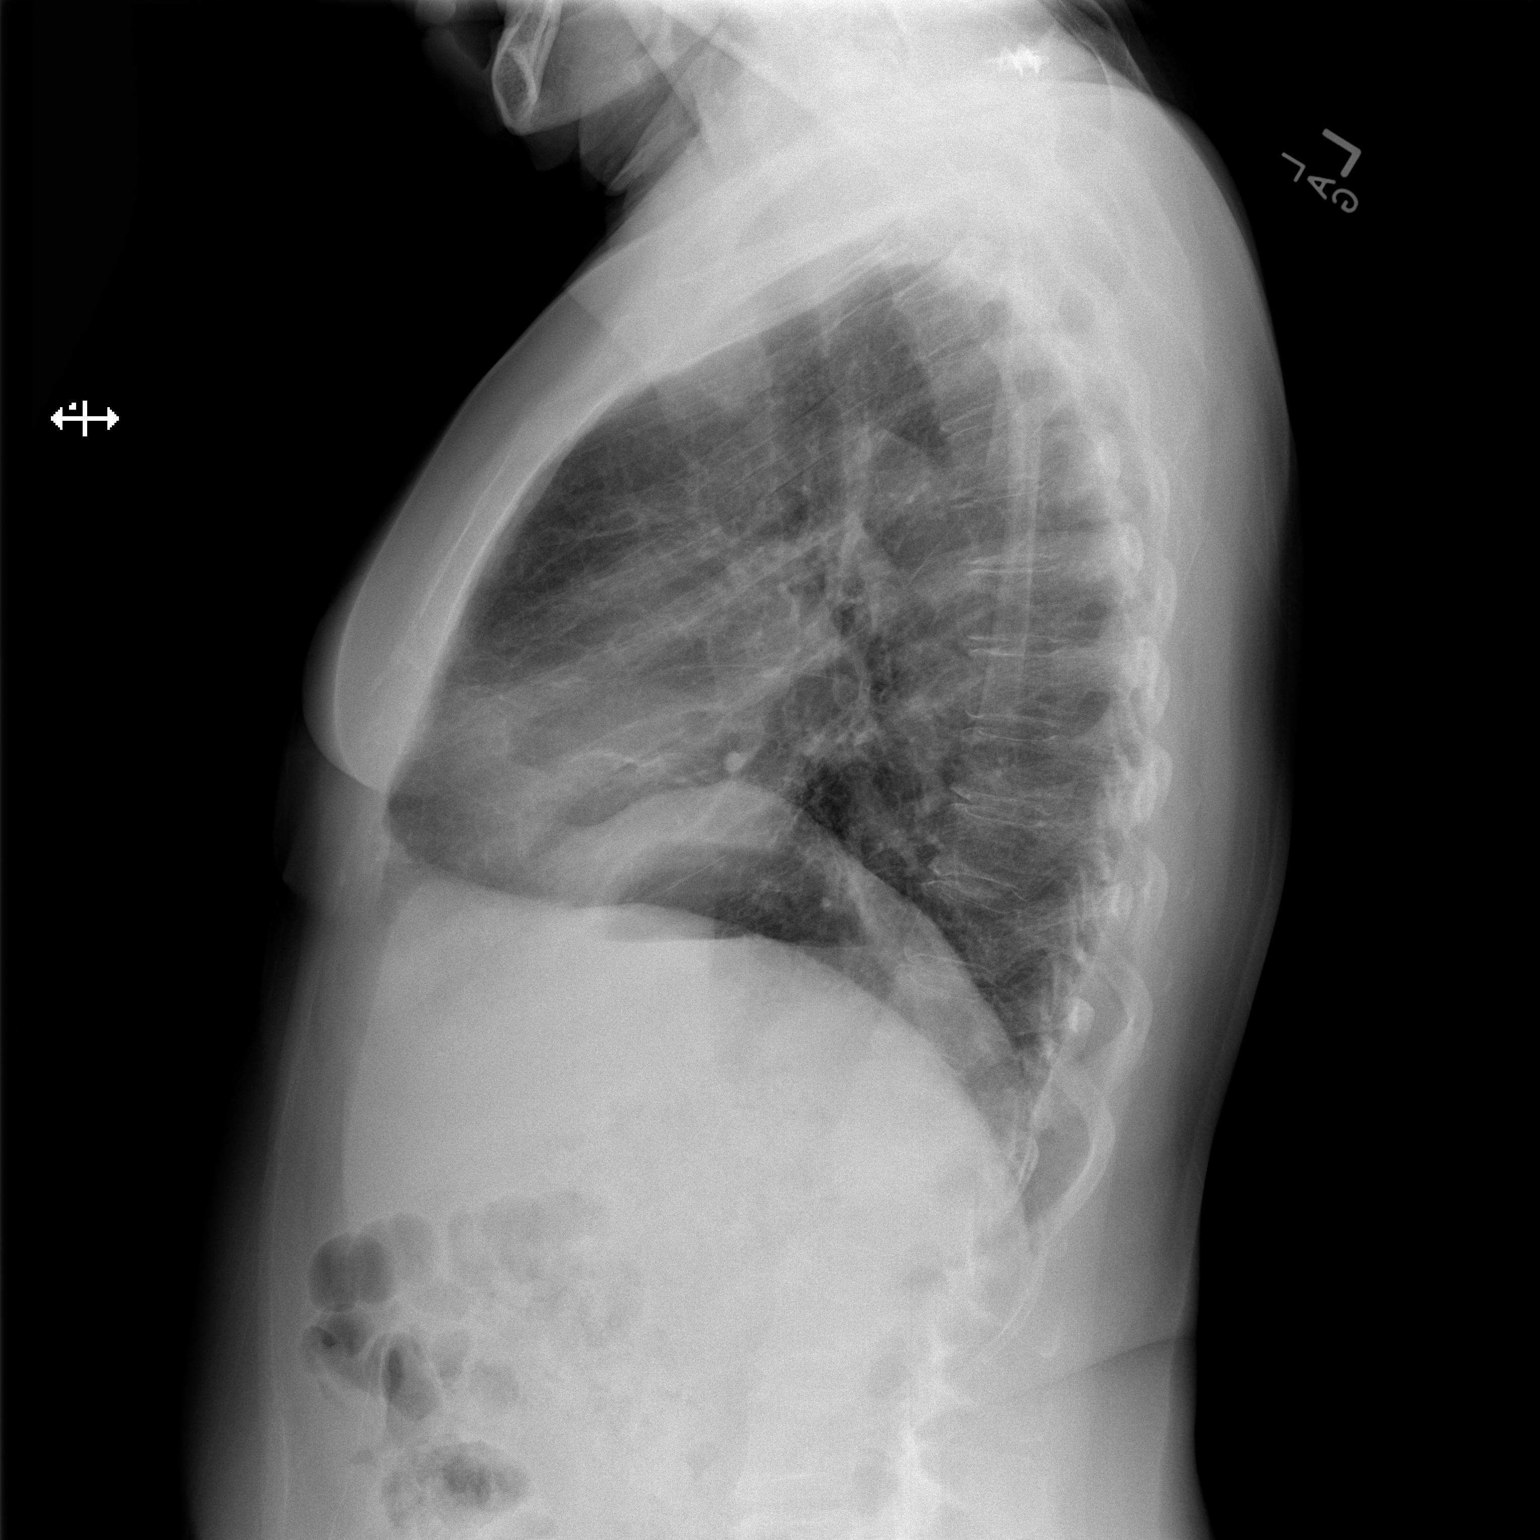

[2 of 2 positions shown; findings below may reference images not displayed]

FINDINGS: The heart size and mediastinal contours are stable.
There is chronic left lower lobe scarring or atelectasis which is
similar to prior studies.  No airspace disease, edema or
significant pleural effusion is identified.  Osseous structures are
unchanged.
IMPRESSION: Stable chronic left lower lobe atelectasis or scarring.  No acute
cardiopulmonary process.

## 2014-08-01 DIAGNOSIS — G8929 Other chronic pain: Secondary | ICD-10-CM | POA: Diagnosis not present

## 2014-08-01 DIAGNOSIS — M25512 Pain in left shoulder: Secondary | ICD-10-CM | POA: Diagnosis not present

## 2014-08-01 DIAGNOSIS — M25562 Pain in left knee: Secondary | ICD-10-CM | POA: Diagnosis not present

## 2014-08-01 DIAGNOSIS — M25561 Pain in right knee: Secondary | ICD-10-CM | POA: Diagnosis not present

## 2014-08-01 DIAGNOSIS — M542 Cervicalgia: Secondary | ICD-10-CM | POA: Diagnosis not present

## 2014-08-09 ENCOUNTER — Ambulatory Visit: Payer: Medicare Other | Admitting: *Deleted

## 2014-08-09 DIAGNOSIS — M7541 Impingement syndrome of right shoulder: Secondary | ICD-10-CM | POA: Diagnosis not present

## 2014-08-09 DIAGNOSIS — M542 Cervicalgia: Secondary | ICD-10-CM | POA: Diagnosis not present

## 2014-08-13 DIAGNOSIS — M7541 Impingement syndrome of right shoulder: Secondary | ICD-10-CM | POA: Diagnosis not present

## 2014-08-18 DIAGNOSIS — M797 Fibromyalgia: Secondary | ICD-10-CM | POA: Diagnosis not present

## 2014-08-18 DIAGNOSIS — M542 Cervicalgia: Secondary | ICD-10-CM | POA: Diagnosis not present

## 2014-08-18 DIAGNOSIS — R42 Dizziness and giddiness: Secondary | ICD-10-CM | POA: Diagnosis not present

## 2014-08-18 DIAGNOSIS — G894 Chronic pain syndrome: Secondary | ICD-10-CM | POA: Diagnosis not present

## 2014-08-18 DIAGNOSIS — H8112 Benign paroxysmal vertigo, left ear: Secondary | ICD-10-CM | POA: Diagnosis not present

## 2014-08-18 DIAGNOSIS — M545 Low back pain: Secondary | ICD-10-CM | POA: Diagnosis not present

## 2014-08-19 DIAGNOSIS — R222 Localized swelling, mass and lump, trunk: Secondary | ICD-10-CM | POA: Diagnosis not present

## 2014-08-22 ENCOUNTER — Other Ambulatory Visit: Payer: Self-pay | Admitting: Otolaryngology

## 2014-08-22 ENCOUNTER — Other Ambulatory Visit: Payer: Self-pay | Admitting: Internal Medicine

## 2014-08-22 DIAGNOSIS — R222 Localized swelling, mass and lump, trunk: Secondary | ICD-10-CM

## 2014-08-22 DIAGNOSIS — M7541 Impingement syndrome of right shoulder: Secondary | ICD-10-CM | POA: Diagnosis not present

## 2014-08-22 DIAGNOSIS — M542 Cervicalgia: Secondary | ICD-10-CM | POA: Diagnosis not present

## 2014-08-23 DIAGNOSIS — Z136 Encounter for screening for cardiovascular disorders: Secondary | ICD-10-CM | POA: Diagnosis not present

## 2014-08-23 DIAGNOSIS — E1169 Type 2 diabetes mellitus with other specified complication: Secondary | ICD-10-CM | POA: Diagnosis not present

## 2014-08-26 ENCOUNTER — Encounter: Payer: Medicare Other | Attending: Internal Medicine | Admitting: *Deleted

## 2014-08-26 DIAGNOSIS — M542 Cervicalgia: Secondary | ICD-10-CM | POA: Diagnosis not present

## 2014-08-26 DIAGNOSIS — R42 Dizziness and giddiness: Secondary | ICD-10-CM | POA: Diagnosis not present

## 2014-08-26 DIAGNOSIS — M2550 Pain in unspecified joint: Secondary | ICD-10-CM | POA: Diagnosis not present

## 2014-08-26 DIAGNOSIS — Z713 Dietary counseling and surveillance: Secondary | ICD-10-CM | POA: Insufficient documentation

## 2014-08-26 DIAGNOSIS — M797 Fibromyalgia: Secondary | ICD-10-CM | POA: Diagnosis not present

## 2014-08-26 DIAGNOSIS — M150 Primary generalized (osteo)arthritis: Secondary | ICD-10-CM | POA: Diagnosis not present

## 2014-08-26 DIAGNOSIS — G8929 Other chronic pain: Secondary | ICD-10-CM | POA: Diagnosis not present

## 2014-08-26 DIAGNOSIS — E1165 Type 2 diabetes mellitus with hyperglycemia: Secondary | ICD-10-CM

## 2014-08-26 DIAGNOSIS — M545 Low back pain: Secondary | ICD-10-CM | POA: Diagnosis not present

## 2014-08-26 DIAGNOSIS — IMO0002 Reserved for concepts with insufficient information to code with codable children: Secondary | ICD-10-CM

## 2014-08-26 DIAGNOSIS — R768 Other specified abnormal immunological findings in serum: Secondary | ICD-10-CM | POA: Diagnosis not present

## 2014-08-26 NOTE — Progress Notes (Signed)
  Pump Follow Up Progress Note on 08/26/14  Orders received from MD giving me permission to make insulin pump adjustments for this patient.  Today's visit was to review [facility] reports for BG control assessment      Hypoglycemia Hyperglycemia Comments  Overnight Period:      Pre-Meal:    Breakfast      Lunch      Supper  YES BASAL  Post-Meal: Breakfast      Lunch  YES BASAL   Supper  YES BASAL  Bedtime:       Comments: Patient expresses continued frustration with chronic pain and continues to battle with depression. Per [facility] Reports, she is SMBG on average twice a day, she is using the Bolus Wizard for meals and correction doses, and she is changing out her reservoir ever 3-4 days. Her average BG for the past 2 weeks is 199 +/- 72 mg/dl. Her Fasting BG are typically under 180 mg/dl but they appear to climb in the afternoons and evenings. I plan to increase her Basal Rate at 10 AM per below.     Pump Settings: Target Range: 100-100.  Date: Current Date: 08/26/14  Changes in bold print   Basal Rate: Carb Ratio Sensitivity  Basal Rate: Carb Ratio Sensitivity   MN: 1.50 4.0 20 MN: 1.50   4.0 20  10 A 2.00   10 A 2.20  (+)                                                  Plan: You are doing a great job! We increased your Basal Rates by 10% at 10 AM help with some of the hyperglycemia you've been having in the afternoon and evenings Conitnue testing your BG 2-4 times a day, pre meal and at bedtime, especially since you are not getting symptoms for low BG anymore. Continue using the Bolus Wizard for meals and correction doses   Patient to continue to use Bolus Wizard and change out pump every 3 days. I reminded her  to call me if BG below 70 or above 300 mg/dl. She will meet with me in 3 months prior to her next 3 month appointment with Dr. Buddy Duty to evaluate these changes before her next A1c is drawn.

## 2014-08-29 DIAGNOSIS — M542 Cervicalgia: Secondary | ICD-10-CM | POA: Diagnosis not present

## 2014-08-29 DIAGNOSIS — M797 Fibromyalgia: Secondary | ICD-10-CM | POA: Diagnosis not present

## 2014-08-29 DIAGNOSIS — R42 Dizziness and giddiness: Secondary | ICD-10-CM | POA: Diagnosis not present

## 2014-08-29 DIAGNOSIS — M545 Low back pain: Secondary | ICD-10-CM | POA: Diagnosis not present

## 2014-08-30 DIAGNOSIS — M25512 Pain in left shoulder: Secondary | ICD-10-CM | POA: Diagnosis not present

## 2014-08-30 DIAGNOSIS — G8929 Other chronic pain: Secondary | ICD-10-CM | POA: Diagnosis not present

## 2014-08-30 DIAGNOSIS — M542 Cervicalgia: Secondary | ICD-10-CM | POA: Diagnosis not present

## 2014-08-30 DIAGNOSIS — M25561 Pain in right knee: Secondary | ICD-10-CM | POA: Diagnosis not present

## 2014-08-30 DIAGNOSIS — M25511 Pain in right shoulder: Secondary | ICD-10-CM | POA: Diagnosis not present

## 2014-08-31 DIAGNOSIS — E782 Mixed hyperlipidemia: Secondary | ICD-10-CM | POA: Diagnosis not present

## 2014-08-31 DIAGNOSIS — E1169 Type 2 diabetes mellitus with other specified complication: Secondary | ICD-10-CM | POA: Diagnosis not present

## 2014-08-31 DIAGNOSIS — Z789 Other specified health status: Secondary | ICD-10-CM | POA: Diagnosis not present

## 2014-08-31 NOTE — Patient Instructions (Signed)
Plan: You are doing a great job! We increased your Basal Rates by 10% at 10 AM help with some of the hyperglycemia you've been having in the afternoon and evenings Conitnue testing your BG 2-4 times a day, pre meal and at bedtime, especially since you are not getting symptoms for low BG anymore. Continue using the Bolus Wizard for meals and correction doses

## 2014-09-05 ENCOUNTER — Ambulatory Visit
Admission: RE | Admit: 2014-09-05 | Discharge: 2014-09-05 | Disposition: A | Discharge status: Home / Self Care | Payer: Medicare Other | Source: Ambulatory Visit | Attending: Otolaryngology | Admitting: Otolaryngology

## 2014-09-05 DIAGNOSIS — R222 Localized swelling, mass and lump, trunk: Secondary | ICD-10-CM

## 2014-09-05 DIAGNOSIS — R221 Localized swelling, mass and lump, neck: Secondary | ICD-10-CM | POA: Diagnosis not present

## 2014-09-05 DIAGNOSIS — F4321 Adjustment disorder with depressed mood: Secondary | ICD-10-CM | POA: Diagnosis not present

## 2014-09-05 DIAGNOSIS — E1065 Type 1 diabetes mellitus with hyperglycemia: Secondary | ICD-10-CM | POA: Diagnosis not present

## 2014-09-05 DIAGNOSIS — E1042 Type 1 diabetes mellitus with diabetic polyneuropathy: Secondary | ICD-10-CM | POA: Diagnosis not present

## 2014-09-05 DIAGNOSIS — E785 Hyperlipidemia, unspecified: Secondary | ICD-10-CM | POA: Diagnosis not present

## 2014-09-05 DIAGNOSIS — M47817 Spondylosis without myelopathy or radiculopathy, lumbosacral region: Secondary | ICD-10-CM | POA: Diagnosis not present

## 2014-09-05 DIAGNOSIS — Z1389 Encounter for screening for other disorder: Secondary | ICD-10-CM | POA: Diagnosis not present

## 2014-09-05 DIAGNOSIS — I10 Essential (primary) hypertension: Secondary | ICD-10-CM | POA: Diagnosis not present

## 2014-09-05 MED ORDER — IOPAMIDOL (ISOVUE-300) INJECTION 61%
75.0000 mL | Freq: Once | INTRAVENOUS | Status: AC | PRN
  Administered 2014-09-05: 75 mL via INTRAVENOUS

## 2014-09-06 ENCOUNTER — Other Ambulatory Visit: Payer: Medicare Other

## 2014-09-06 DIAGNOSIS — M545 Low back pain: Secondary | ICD-10-CM | POA: Diagnosis not present

## 2014-09-06 DIAGNOSIS — M542 Cervicalgia: Secondary | ICD-10-CM | POA: Diagnosis not present

## 2014-09-06 DIAGNOSIS — M797 Fibromyalgia: Secondary | ICD-10-CM | POA: Diagnosis not present

## 2014-09-06 DIAGNOSIS — R42 Dizziness and giddiness: Secondary | ICD-10-CM | POA: Diagnosis not present

## 2014-09-07 DIAGNOSIS — F4310 Post-traumatic stress disorder, unspecified: Secondary | ICD-10-CM | POA: Diagnosis not present

## 2014-09-08 DIAGNOSIS — S4991XD Unspecified injury of right shoulder and upper arm, subsequent encounter: Secondary | ICD-10-CM | POA: Diagnosis not present

## 2014-09-09 DIAGNOSIS — M542 Cervicalgia: Secondary | ICD-10-CM | POA: Diagnosis not present

## 2014-09-09 DIAGNOSIS — M797 Fibromyalgia: Secondary | ICD-10-CM | POA: Diagnosis not present

## 2014-09-09 DIAGNOSIS — R42 Dizziness and giddiness: Secondary | ICD-10-CM | POA: Diagnosis not present

## 2014-09-09 DIAGNOSIS — M545 Low back pain: Secondary | ICD-10-CM | POA: Diagnosis not present

## 2014-09-12 DIAGNOSIS — F4310 Post-traumatic stress disorder, unspecified: Secondary | ICD-10-CM | POA: Diagnosis not present

## 2014-09-14 DIAGNOSIS — F4310 Post-traumatic stress disorder, unspecified: Secondary | ICD-10-CM | POA: Diagnosis not present

## 2014-09-15 DIAGNOSIS — M47892 Other spondylosis, cervical region: Secondary | ICD-10-CM | POA: Diagnosis not present

## 2014-09-15 DIAGNOSIS — M5022 Other cervical disc displacement, mid-cervical region: Secondary | ICD-10-CM | POA: Diagnosis not present

## 2014-09-15 DIAGNOSIS — M75101 Unspecified rotator cuff tear or rupture of right shoulder, not specified as traumatic: Secondary | ICD-10-CM | POA: Diagnosis not present

## 2014-09-19 DIAGNOSIS — G8918 Other acute postprocedural pain: Secondary | ICD-10-CM | POA: Diagnosis not present

## 2014-09-19 DIAGNOSIS — M1991 Primary osteoarthritis, unspecified site: Secondary | ICD-10-CM | POA: Diagnosis not present

## 2014-09-19 DIAGNOSIS — X58XXXA Exposure to other specified factors, initial encounter: Secondary | ICD-10-CM | POA: Diagnosis not present

## 2014-09-19 DIAGNOSIS — S43431A Superior glenoid labrum lesion of right shoulder, initial encounter: Secondary | ICD-10-CM | POA: Diagnosis not present

## 2014-09-19 DIAGNOSIS — S46011A Strain of muscle(s) and tendon(s) of the rotator cuff of right shoulder, initial encounter: Secondary | ICD-10-CM | POA: Diagnosis not present

## 2014-09-19 DIAGNOSIS — M19011 Primary osteoarthritis, right shoulder: Secondary | ICD-10-CM | POA: Diagnosis not present

## 2014-09-19 DIAGNOSIS — M75121 Complete rotator cuff tear or rupture of right shoulder, not specified as traumatic: Secondary | ICD-10-CM | POA: Diagnosis not present

## 2014-09-19 DIAGNOSIS — Y929 Unspecified place or not applicable: Secondary | ICD-10-CM | POA: Diagnosis not present

## 2014-09-22 DIAGNOSIS — S46011A Strain of muscle(s) and tendon(s) of the rotator cuff of right shoulder, initial encounter: Secondary | ICD-10-CM | POA: Diagnosis not present

## 2014-09-26 ENCOUNTER — Encounter: Payer: Medicare Other | Admitting: Podiatry

## 2014-09-27 DIAGNOSIS — S46011D Strain of muscle(s) and tendon(s) of the rotator cuff of right shoulder, subsequent encounter: Secondary | ICD-10-CM | POA: Diagnosis not present

## 2014-09-29 DIAGNOSIS — S46011D Strain of muscle(s) and tendon(s) of the rotator cuff of right shoulder, subsequent encounter: Secondary | ICD-10-CM | POA: Diagnosis not present

## 2014-10-03 DIAGNOSIS — S46011D Strain of muscle(s) and tendon(s) of the rotator cuff of right shoulder, subsequent encounter: Secondary | ICD-10-CM | POA: Diagnosis not present

## 2014-10-04 DIAGNOSIS — R202 Paresthesia of skin: Secondary | ICD-10-CM | POA: Diagnosis not present

## 2014-10-04 DIAGNOSIS — M542 Cervicalgia: Secondary | ICD-10-CM | POA: Diagnosis not present

## 2014-10-04 DIAGNOSIS — G8929 Other chronic pain: Secondary | ICD-10-CM | POA: Diagnosis not present

## 2014-10-04 DIAGNOSIS — M545 Low back pain: Secondary | ICD-10-CM | POA: Diagnosis not present

## 2014-10-05 DIAGNOSIS — M75111 Incomplete rotator cuff tear or rupture of right shoulder, not specified as traumatic: Secondary | ICD-10-CM | POA: Diagnosis not present

## 2014-10-05 DIAGNOSIS — Z4789 Encounter for other orthopedic aftercare: Secondary | ICD-10-CM | POA: Diagnosis not present

## 2014-10-05 DIAGNOSIS — M7541 Impingement syndrome of right shoulder: Secondary | ICD-10-CM | POA: Diagnosis not present

## 2014-10-06 DIAGNOSIS — S46011D Strain of muscle(s) and tendon(s) of the rotator cuff of right shoulder, subsequent encounter: Secondary | ICD-10-CM | POA: Diagnosis not present

## 2014-10-10 DIAGNOSIS — S46011D Strain of muscle(s) and tendon(s) of the rotator cuff of right shoulder, subsequent encounter: Secondary | ICD-10-CM | POA: Diagnosis not present

## 2014-10-17 ENCOUNTER — Encounter: Payer: Self-pay | Admitting: Podiatry

## 2014-10-17 ENCOUNTER — Ambulatory Visit (INDEPENDENT_AMBULATORY_CARE_PROVIDER_SITE_OTHER): Payer: Medicare Other | Admitting: Podiatry

## 2014-10-17 VITALS — BP 127/70 | HR 84 | Resp 12

## 2014-10-17 DIAGNOSIS — E0842 Diabetes mellitus due to underlying condition with diabetic polyneuropathy: Secondary | ICD-10-CM

## 2014-10-17 DIAGNOSIS — S46011D Strain of muscle(s) and tendon(s) of the rotator cuff of right shoulder, subsequent encounter: Secondary | ICD-10-CM | POA: Diagnosis not present

## 2014-10-17 DIAGNOSIS — L609 Nail disorder, unspecified: Secondary | ICD-10-CM | POA: Diagnosis not present

## 2014-10-17 DIAGNOSIS — L608 Other nail disorders: Secondary | ICD-10-CM

## 2014-10-17 NOTE — Progress Notes (Signed)
   Subjective:    Patient ID: [name], female    DOB: 05/17/1961, 54 y.o.   MRN: [id number]  HPI  N-SORE L-B/L TOENAILS D-LONG-TERM O-SLOWLY C-LONGER A-CERTAIN SHOES T-NONE  Patient denies any history of foot ulcer or claudication  Review of Systems  Eyes: Positive for visual disturbance.  Cardiovascular: Positive for leg swelling.  Musculoskeletal: Positive for joint swelling.  Neurological: Positive for dizziness, numbness and headaches.       Objective:   Physical Exam  Orientated 3  Vascular: DP and PT pulses 2/4 bilaterally Capillary reflex immediate bilaterally  Neurological: Sensation to 10 g monofilament wire intact 3/5 right and 4/5 left Ankle reflexes equal and reactive bilaterally Vibratory sensation intact bilaterally   Dermatological: Texture and turgor within normal limits The toenails are elongated and mildly incurvated without any texture and color changes 6-10  Musculoskeletal: Pes planus bilaterally HAV deformity left Ino restriction ankle, subtalar, midtarsal joints bilaterally     Assessment & Plan:   Assessment: Satisfactory neurovascular status Non-dystrophic toenails 6-10  Plan: I discussed treatment findings with patient today made aware that I thought that nail debridement could be performed by pedicurist. I advised her not to soak feet and whirlpool or had the cuticle was manipulated  Debridement of non-dystrophic toenails 10 without any bleeding  Reappoint yearly or at patient's request

## 2014-10-17 NOTE — Patient Instructions (Signed)
Okay to go to pedicurist just to trim ends of toenails Do not soak feet in water pedicurist Do not manipulate cuticles Diabetes and Foot Care Diabetes may cause you to have problems because of poor blood supply (circulation) to your feet and legs. This may cause the skin on your feet to become thinner, break easier, and heal more slowly. Your skin may become dry, and the skin may peel and crack. You may also have nerve damage in your legs and feet causing decreased feeling in them. You may not notice minor injuries to your feet that could lead to infections or more serious problems. Taking care of your feet is one of the most important things you can do for yourself.  HOME CARE INSTRUCTIONS  Wear shoes at all times, even in the house. Do not go barefoot. Bare feet are easily injured.  Check your feet daily for blisters, cuts, and redness. If you cannot see the bottom of your feet, use a mirror or ask someone for help.  Wash your feet with warm water (do not use hot water) and mild soap. Then pat your feet and the areas between your toes until they are completely dry. Do not soak your feet as this can dry your skin.  Apply a moisturizing lotion or petroleum jelly (that does not contain alcohol and is unscented) to the skin on your feet and to dry, brittle toenails. Do not apply lotion between your toes.  Trim your toenails straight across. Do not dig under them or around the cuticle. File the edges of your nails with an emery board or nail file.  Do not cut corns or calluses or try to remove them with medicine.  Wear clean socks or stockings every day. Make sure they are not too tight. Do not wear knee-high stockings since they may decrease blood flow to your legs.  Wear shoes that fit properly and have enough cushioning. To break in new shoes, wear them for just a few hours a day. This prevents you from injuring your feet. Always look in your shoes before you put them on to be sure there are no  objects inside.  Do not cross your legs. This may decrease the blood flow to your feet.  If you find a minor scrape, cut, or break in the skin on your feet, keep it and the skin around it clean and dry. These areas may be cleansed with mild soap and water. Do not cleanse the area with peroxide, alcohol, or iodine.  When you remove an adhesive bandage, be sure not to damage the skin around it.  If you have a wound, look at it several times a day to make sure it is healing.  Do not use heating pads or hot water bottles. They may burn your skin. If you have lost feeling in your feet or legs, you may not know it is happening until it is too late.  Make sure your health care provider performs a complete foot exam at least annually or more often if you have foot problems. Report any cuts, sores, or bruises to your health care provider immediately. SEEK MEDICAL CARE IF:   You have an injury that is not healing.  You have cuts or breaks in the skin.  You have an ingrown nail.  You notice redness on your legs or feet.  You feel burning or tingling in your legs or feet.  You have pain or cramps in your legs and feet.  Your  legs or feet are numb.  Your feet always feel cold. SEEK IMMEDIATE MEDICAL CARE IF:   There is increasing redness, swelling, or pain in or around a wound.  There is a red line that goes up your leg.  Pus is coming from a wound.  You develop a fever or as directed by your health care provider.  You notice a bad smell coming from an ulcer or wound. Document Released: 06/14/2000 Document Revised: 02/17/2013 Document Reviewed: 11/24/2012 [facility] Patient Information 2015 [place], [place]. This information is not intended to replace advice given to you by your health care provider. Make sure you discuss any questions you have with your health care provider.

## 2014-10-18 ENCOUNTER — Ambulatory Visit: Payer: Medicare Other | Admitting: Neurology

## 2014-10-20 DIAGNOSIS — S46011D Strain of muscle(s) and tendon(s) of the rotator cuff of right shoulder, subsequent encounter: Secondary | ICD-10-CM | POA: Diagnosis not present

## 2014-10-24 DIAGNOSIS — S46011D Strain of muscle(s) and tendon(s) of the rotator cuff of right shoulder, subsequent encounter: Secondary | ICD-10-CM | POA: Diagnosis not present

## 2014-11-03 DIAGNOSIS — M75111 Incomplete rotator cuff tear or rupture of right shoulder, not specified as traumatic: Secondary | ICD-10-CM | POA: Diagnosis not present

## 2014-11-03 DIAGNOSIS — S46011A Strain of muscle(s) and tendon(s) of the rotator cuff of right shoulder, initial encounter: Secondary | ICD-10-CM | POA: Diagnosis not present

## 2014-11-03 DIAGNOSIS — Z4789 Encounter for other orthopedic aftercare: Secondary | ICD-10-CM | POA: Diagnosis not present

## 2014-11-11 DIAGNOSIS — M75101 Unspecified rotator cuff tear or rupture of right shoulder, not specified as traumatic: Secondary | ICD-10-CM | POA: Diagnosis not present

## 2014-11-11 DIAGNOSIS — M5022 Other cervical disc displacement, mid-cervical region: Secondary | ICD-10-CM | POA: Diagnosis not present

## 2014-11-11 DIAGNOSIS — M47892 Other spondylosis, cervical region: Secondary | ICD-10-CM | POA: Diagnosis not present

## 2014-11-15 DIAGNOSIS — G894 Chronic pain syndrome: Secondary | ICD-10-CM | POA: Diagnosis not present

## 2014-11-15 DIAGNOSIS — Z79891 Long term (current) use of opiate analgesic: Secondary | ICD-10-CM | POA: Diagnosis not present

## 2014-11-29 NOTE — Progress Notes (Signed)
This encounter was created in error - please disregard. This encounter was created in error - please disregard. This encounter was created in error - please disregard. 

## 2014-12-08 ENCOUNTER — Ambulatory Visit: Payer: Medicare Other | Admitting: *Deleted

## 2015-01-12 DIAGNOSIS — M5022 Other cervical disc displacement, mid-cervical region: Secondary | ICD-10-CM | POA: Diagnosis not present

## 2015-01-12 DIAGNOSIS — M47817 Spondylosis without myelopathy or radiculopathy, lumbosacral region: Secondary | ICD-10-CM | POA: Diagnosis not present

## 2015-01-12 DIAGNOSIS — G894 Chronic pain syndrome: Secondary | ICD-10-CM | POA: Diagnosis not present

## 2015-01-12 DIAGNOSIS — M549 Dorsalgia, unspecified: Secondary | ICD-10-CM | POA: Diagnosis not present

## 2015-01-25 ENCOUNTER — Encounter: Payer: Medicare Other | Attending: Internal Medicine | Admitting: *Deleted

## 2015-01-25 DIAGNOSIS — IMO0002 Reserved for concepts with insufficient information to code with codable children: Secondary | ICD-10-CM

## 2015-01-25 DIAGNOSIS — E1165 Type 2 diabetes mellitus with hyperglycemia: Secondary | ICD-10-CM | POA: Diagnosis not present

## 2015-01-26 ENCOUNTER — Encounter: Payer: Self-pay | Admitting: *Deleted

## 2015-01-26 NOTE — Progress Notes (Signed)
Professional Dexcom Set Up on 01/25/15 Time arrived:0930  Time left: 1030 Patient here for placement of Dexcom Continuous Glucose Monitor   Explained to patient purpose of wearing the Dexcom per MD orders. They expressed verbal understanding.  Sensor inserted into skin at least 2 inches from any insulin injection sites. Patient instructed to check BG 2 times each day to calibrate. Explained to patient how to input onto [facility] Receiver all BG, food intake, exercise and any diabetes medications.  Patient instructed to return on 01/30/15 for removal of sensor and Dexcom  Dexcom transmitter attached to sensor and taped down.  Patient informed that when Dexcom and sensor are connected, the system is waterproof and that they are to wear it consistently until they return to this office.  Patient to call this office if any questions or concerns prior to appointment to return the [facility] for downloading.  In addition to placement of Dexcom CGM, patient requested I upload her Medtronic insulin pump to observe her reports.   Orders received from MD giving me permission to make insulin pump adjustments for this patient.  Reviewed blood glucose logs on 01/25/15 via:  [facility] found the following:                 Hypoglycemia Hyperglycemia Comments  Overnight Period:      Pre-Meal:    Breakfast      Lunch      Supper     Post-Meal: Breakfast      Lunch      Supper YES  Potential Carb Ratio at supper  Bedtime:       Comments: Overall, BG management looks pretty good. Average BG over past 2 weeks is at 173 +/- 107.  The high excursion was due to omitting a bolus for a meal after recovering from a hypoglycemic event. Otherwise,  BG appear to be within target ranges most of the time. No changes made to pump settings today  Pump Settings: Date: Current Date: 01/25/15  No changes made today   Basal Rate: Carb Ratio Sensitivity  Basal Rate: Carb Ratio Sensitivity   MN: 1.20 3.5 15 MN: 1.20 3.5 15    10  A 1.80   10 A 1.80                                                  Plan: Patient to continue using Bolus Wizard with insulin pump and to report any BG's above 300 or repeated hypoglycemia to her MD or to me as needed.  Follow up:6 weeks

## 2015-01-30 DIAGNOSIS — E1042 Type 1 diabetes mellitus with diabetic polyneuropathy: Secondary | ICD-10-CM | POA: Diagnosis not present

## 2015-01-30 DIAGNOSIS — Z23 Encounter for immunization: Secondary | ICD-10-CM | POA: Diagnosis not present

## 2015-01-30 DIAGNOSIS — E1165 Type 2 diabetes mellitus with hyperglycemia: Secondary | ICD-10-CM | POA: Diagnosis not present

## 2015-01-30 DIAGNOSIS — R6889 Other general symptoms and signs: Secondary | ICD-10-CM | POA: Diagnosis not present

## 2015-01-30 DIAGNOSIS — Z794 Long term (current) use of insulin: Secondary | ICD-10-CM | POA: Diagnosis not present

## 2015-02-07 DIAGNOSIS — Z4789 Encounter for other orthopedic aftercare: Secondary | ICD-10-CM | POA: Diagnosis not present

## 2015-02-07 DIAGNOSIS — M25511 Pain in right shoulder: Secondary | ICD-10-CM | POA: Diagnosis not present

## 2015-02-07 DIAGNOSIS — M75111 Incomplete rotator cuff tear or rupture of right shoulder, not specified as traumatic: Secondary | ICD-10-CM | POA: Diagnosis not present

## 2015-02-14 DIAGNOSIS — I10 Essential (primary) hypertension: Secondary | ICD-10-CM | POA: Diagnosis not present

## 2015-02-14 DIAGNOSIS — G894 Chronic pain syndrome: Secondary | ICD-10-CM | POA: Diagnosis not present

## 2015-02-14 DIAGNOSIS — M47892 Other spondylosis, cervical region: Secondary | ICD-10-CM | POA: Diagnosis not present

## 2015-02-14 DIAGNOSIS — Z6825 Body mass index (BMI) 25.0-25.9, adult: Secondary | ICD-10-CM | POA: Diagnosis not present

## 2015-03-16 ENCOUNTER — Encounter: Payer: Medicare Other | Attending: Internal Medicine | Admitting: *Deleted

## 2015-03-16 DIAGNOSIS — Z794 Long term (current) use of insulin: Secondary | ICD-10-CM | POA: Diagnosis not present

## 2015-03-16 DIAGNOSIS — E1165 Type 2 diabetes mellitus with hyperglycemia: Secondary | ICD-10-CM | POA: Diagnosis not present

## 2015-03-16 DIAGNOSIS — E1042 Type 1 diabetes mellitus with diabetic polyneuropathy: Secondary | ICD-10-CM | POA: Diagnosis not present

## 2015-03-16 DIAGNOSIS — E109 Type 1 diabetes mellitus without complications: Secondary | ICD-10-CM

## 2015-03-16 NOTE — Patient Instructions (Signed)
Plan: I have provided you with the Card for Therapeutic Alternatives if you feel you need to reach out to someone to vent or if you feel you want to give up. You also have my cell # to call if you need me anytime. We have looked at your CareLink Pro Reports and your BG's have improved and you are having fewer low BG reactions, we are not making any changes to your pump settings today. I have given you the DM 1 Support Group Flyer and you have said you are able to take the bus to come to these Support Group meetings. Please do! I plan to look into any options that may allow you to have access to CGM which would provide you with some peace of mind regarding your unawareness of low BG until you are dangerously low. Please follow up with Dr. Buddy Duty or your Primary Care MD regarding your anxiety and treatment options for that.

## 2015-03-17 DIAGNOSIS — M47892 Other spondylosis, cervical region: Secondary | ICD-10-CM | POA: Diagnosis not present

## 2015-03-17 DIAGNOSIS — M47817 Spondylosis without myelopathy or radiculopathy, lumbosacral region: Secondary | ICD-10-CM | POA: Diagnosis not present

## 2015-03-17 DIAGNOSIS — Z79891 Long term (current) use of opiate analgesic: Secondary | ICD-10-CM | POA: Diagnosis not present

## 2015-03-17 DIAGNOSIS — G894 Chronic pain syndrome: Secondary | ICD-10-CM | POA: Diagnosis not present

## 2015-03-17 DIAGNOSIS — Z79899 Other long term (current) drug therapy: Secondary | ICD-10-CM | POA: Diagnosis not present

## 2015-03-23 ENCOUNTER — Encounter: Payer: Self-pay | Admitting: *Deleted

## 2015-03-23 NOTE — Progress Notes (Signed)
Follow Up Visit on 03/16/15  Start time: 1030      End time: 1100  Patient states she is very anxious lately. She has a couple of friends that tend to help with driving her to appointments and the grocery store but one is not able to help as much lately due to other commitments. She also expresses great concern over her hypoglycemia unawareness.   Reviewed blood glucose logs on 03/16/15 via:  [facility] found the following:          Hypoglycemia Hyperglycemia Comments  Overnight Period:      Pre-Meal:    Breakfast      Lunch  YES but rare    Supper     Post-Meal: Breakfast      Lunch      Supper     Bedtime:       Comments: Average BG for past 2 weeks improved from 173 +/- 107 to 158 mg/dl +/- 57. Patient states less hypoglycemia too.  She seemed relieved when she was able to see her BG information on the Reports.  I also informed her of the DM 1 Support Group, especially since she feels isolated and alone with her diabetes. She expressed interest in attending and said she could get transportation to get there. I also encouraged her to follow up with her PCP regarding her anxiety and that she should explain that she feels what she is feeling is not depression, so the treatment options would be different.  Pump Settings: Date: Current Date: 03/16/15  No changes made today   Basal Rate: Carb Ratio Sensitivity  Basal Rate: Carb Ratio Sensitivity   MN: 1.40 3.0 15 MN: 1.40 3.0 15  3 A: 1.00   3 A 1.00    10 A 1.30   10 A 1.30                                         Plan: I have provided you with the Card for Therapeutic Alternatives if you feel you need to reach out to someone to vent or if you feel you want to give up. You also have my cell # to call if you need me anytime. We have looked at your CareLink Pro Reports and your BG's have improved and you are having fewer low BG reactions, we are not making any changes to your pump settings today. I have given you the DM 1 Support  Group Flyer and you have said you are able to take the bus to come to these Support Group meetings. Please do! I plan to look into any options that may allow you to have access to CGM which would provide you with some peace of mind regarding your unawareness of low BG until you are dangerously low. Please follow up with Dr. Buddy Duty or your Primary Care MD regarding your anxiety and treatment options for that.  Follow up:6 weeks

## 2015-03-27 DIAGNOSIS — Z23 Encounter for immunization: Secondary | ICD-10-CM | POA: Diagnosis not present

## 2015-04-06 DIAGNOSIS — E1065 Type 1 diabetes mellitus with hyperglycemia: Secondary | ICD-10-CM | POA: Diagnosis not present

## 2015-04-06 DIAGNOSIS — Z794 Long term (current) use of insulin: Secondary | ICD-10-CM | POA: Diagnosis not present

## 2015-04-06 DIAGNOSIS — R6889 Other general symptoms and signs: Secondary | ICD-10-CM | POA: Diagnosis not present

## 2015-04-06 DIAGNOSIS — E1042 Type 1 diabetes mellitus with diabetic polyneuropathy: Secondary | ICD-10-CM | POA: Diagnosis not present

## 2015-04-14 DIAGNOSIS — M47817 Spondylosis without myelopathy or radiculopathy, lumbosacral region: Secondary | ICD-10-CM | POA: Diagnosis not present

## 2015-04-14 DIAGNOSIS — G894 Chronic pain syndrome: Secondary | ICD-10-CM | POA: Diagnosis not present

## 2015-04-20 DIAGNOSIS — I671 Cerebral aneurysm, nonruptured: Secondary | ICD-10-CM | POA: Diagnosis not present

## 2015-04-20 DIAGNOSIS — R51 Headache: Secondary | ICD-10-CM | POA: Diagnosis not present

## 2015-04-20 DIAGNOSIS — R42 Dizziness and giddiness: Secondary | ICD-10-CM | POA: Diagnosis not present

## 2015-04-28 ENCOUNTER — Emergency Department (HOSPITAL_COMMUNITY): Payer: Medicare Other

## 2015-04-28 ENCOUNTER — Encounter (HOSPITAL_COMMUNITY): Payer: Self-pay

## 2015-04-28 ENCOUNTER — Inpatient Hospital Stay (HOSPITAL_COMMUNITY)
Admission: EM | Admit: 2015-04-28 | Discharge: 2015-04-30 | DRG: 379 | Disposition: A | Discharge status: Home / Self Care | Payer: Medicare Other | Attending: Internal Medicine | Admitting: Internal Medicine

## 2015-04-28 DIAGNOSIS — E1140 Type 2 diabetes mellitus with diabetic neuropathy, unspecified: Secondary | ICD-10-CM | POA: Diagnosis present

## 2015-04-28 DIAGNOSIS — K648 Other hemorrhoids: Secondary | ICD-10-CM | POA: Diagnosis present

## 2015-04-28 DIAGNOSIS — F419 Anxiety disorder, unspecified: Secondary | ICD-10-CM | POA: Diagnosis present

## 2015-04-28 DIAGNOSIS — Z8673 Personal history of transient ischemic attack (TIA), and cerebral infarction without residual deficits: Secondary | ICD-10-CM | POA: Diagnosis not present

## 2015-04-28 DIAGNOSIS — Z7982 Long term (current) use of aspirin: Secondary | ICD-10-CM | POA: Diagnosis not present

## 2015-04-28 DIAGNOSIS — F329 Major depressive disorder, single episode, unspecified: Secondary | ICD-10-CM | POA: Diagnosis present

## 2015-04-28 DIAGNOSIS — K644 Residual hemorrhoidal skin tags: Secondary | ICD-10-CM | POA: Diagnosis not present

## 2015-04-28 DIAGNOSIS — R1032 Left lower quadrant pain: Secondary | ICD-10-CM | POA: Diagnosis not present

## 2015-04-28 DIAGNOSIS — K219 Gastro-esophageal reflux disease without esophagitis: Secondary | ICD-10-CM | POA: Diagnosis present

## 2015-04-28 DIAGNOSIS — Z800 Family history of malignant neoplasm of digestive organs: Secondary | ICD-10-CM | POA: Diagnosis not present

## 2015-04-28 DIAGNOSIS — M069 Rheumatoid arthritis, unspecified: Secondary | ICD-10-CM | POA: Diagnosis present

## 2015-04-28 DIAGNOSIS — F17210 Nicotine dependence, cigarettes, uncomplicated: Secondary | ICD-10-CM | POA: Diagnosis present

## 2015-04-28 DIAGNOSIS — K5792 Diverticulitis of intestine, part unspecified, without perforation or abscess without bleeding: Secondary | ICD-10-CM | POA: Diagnosis present

## 2015-04-28 DIAGNOSIS — Z803 Family history of malignant neoplasm of breast: Secondary | ICD-10-CM

## 2015-04-28 DIAGNOSIS — I10 Essential (primary) hypertension: Secondary | ICD-10-CM | POA: Diagnosis present

## 2015-04-28 DIAGNOSIS — E785 Hyperlipidemia, unspecified: Secondary | ICD-10-CM | POA: Diagnosis present

## 2015-04-28 DIAGNOSIS — K922 Gastrointestinal hemorrhage, unspecified: Secondary | ICD-10-CM | POA: Diagnosis not present

## 2015-04-28 DIAGNOSIS — K5733 Diverticulitis of large intestine without perforation or abscess with bleeding: Principal | ICD-10-CM | POA: Diagnosis present

## 2015-04-28 DIAGNOSIS — K5712 Diverticulitis of small intestine without perforation or abscess without bleeding: Secondary | ICD-10-CM | POA: Diagnosis not present

## 2015-04-28 DIAGNOSIS — Z833 Family history of diabetes mellitus: Secondary | ICD-10-CM | POA: Diagnosis not present

## 2015-04-28 DIAGNOSIS — R1010 Upper abdominal pain, unspecified: Secondary | ICD-10-CM | POA: Diagnosis not present

## 2015-04-28 DIAGNOSIS — Z8249 Family history of ischemic heart disease and other diseases of the circulatory system: Secondary | ICD-10-CM | POA: Diagnosis not present

## 2015-04-28 DIAGNOSIS — E1165 Type 2 diabetes mellitus with hyperglycemia: Secondary | ICD-10-CM | POA: Diagnosis not present

## 2015-04-28 DIAGNOSIS — K5713 Diverticulitis of small intestine without perforation or abscess with bleeding: Secondary | ICD-10-CM

## 2015-04-28 DIAGNOSIS — R739 Hyperglycemia, unspecified: Secondary | ICD-10-CM

## 2015-04-28 LAB — COMPREHENSIVE METABOLIC PANEL
ALBUMIN: 3.7 g/dL (ref 3.5–5.0)
ALK PHOS: 90 U/L (ref 38–126)
ALT: 16 U/L (ref 14–54)
AST: 18 U/L (ref 15–41)
Anion gap: 13 (ref 5–15)
BILIRUBIN TOTAL: 0.5 mg/dL (ref 0.3–1.2)
BUN: 13 mg/dL (ref 6–20)
CALCIUM: 10 mg/dL (ref 8.9–10.3)
CO2: 22 mmol/L (ref 22–32)
Chloride: 98 mmol/L — ABNORMAL LOW (ref 101–111)
Creatinine, Ser: 0.92 mg/dL (ref 0.44–1.00)
GFR calc Af Amer: >60 mL/min (ref >60)
GLUCOSE: 433 mg/dL — AB (ref 65–99)
POTASSIUM: 4 mmol/L (ref 3.5–5.1)
Sodium: 133 mmol/L — ABNORMAL LOW (ref 135–145)
TOTAL PROTEIN: 6.5 g/dL (ref 6.5–8.1)

## 2015-04-28 LAB — CBC
HEMATOCRIT: 42.1 % (ref 36.0–46.0)
Hemoglobin: 14.2 g/dL (ref 12.0–15.0)
MCH: 30.2 pg (ref 26.0–34.0)
MCHC: 33.7 g/dL (ref 30.0–36.0)
MCV: 89.6 fL (ref 78.0–100.0)
Platelets: 177 10*3/uL (ref 150–400)
RBC: 4.7 MIL/uL (ref 3.87–5.11)
RDW: 13.6 % (ref 11.5–15.5)
WBC: 10.6 10*3/uL — AB (ref 4.0–10.5)

## 2015-04-28 LAB — TYPE AND SCREEN
ABO/RH(D): O POS
ANTIBODY SCREEN: NEGATIVE

## 2015-04-28 LAB — URINE MICROSCOPIC-ADD ON

## 2015-04-28 LAB — URINALYSIS, ROUTINE W REFLEX MICROSCOPIC
BILIRUBIN URINE: NEGATIVE
HGB URINE DIPSTICK: NEGATIVE
Ketones, ur: NEGATIVE mg/dL
Leukocytes, UA: NEGATIVE
Nitrite: NEGATIVE
PH: 5 (ref 5.0–8.0)
Protein, ur: NEGATIVE mg/dL
Specific Gravity, Urine: 1.023 (ref 1.005–1.030)
Urobilinogen, UA: 0.2 mg/dL (ref 0.0–1.0)

## 2015-04-28 LAB — POC OCCULT BLOOD, ED: Fecal Occult Bld: POSITIVE — AB

## 2015-04-28 LAB — CBG MONITORING, ED: GLUCOSE-CAPILLARY: 251 mg/dL — AB (ref 65–99)

## 2015-04-28 LAB — ABO/RH: ABO/RH(D): O POS

## 2015-04-28 MED ORDER — INSULIN ASPART 100 UNIT/ML ~~LOC~~ SOLN
5.0000 [IU] | Freq: Once | SUBCUTANEOUS | Status: AC
  Administered 2015-04-28: 5 [IU] via SUBCUTANEOUS
  Filled 2015-04-28: qty 1

## 2015-04-28 MED ORDER — SODIUM CHLORIDE 0.9 % IV SOLN: INTRAVENOUS | Status: AC

## 2015-04-28 MED ORDER — ALBUTEROL SULFATE (2.5 MG/3ML) 0.083% IN NEBU
2.5000 mg | INHALATION_SOLUTION | Freq: Four times a day (QID) | RESPIRATORY_TRACT | Status: DC | PRN
Start: 2015-04-28 — End: 2015-04-30

## 2015-04-28 MED ORDER — DEXTROSE-NACL 5-0.45 % IV SOLN
INTRAVENOUS | Status: DC
  Administered 2015-04-30 (×2): via INTRAVENOUS

## 2015-04-28 MED ORDER — ALBUTEROL SULFATE HFA 108 (90 BASE) MCG/ACT IN AERS: 1.0000 | INHALATION_SPRAY | Freq: Four times a day (QID) | RESPIRATORY_TRACT | Status: DC | PRN

## 2015-04-28 MED ORDER — IOHEXOL 300 MG/ML  SOLN
25.0000 mL | Freq: Once | INTRAMUSCULAR | Status: AC | PRN
  Administered 2015-04-28: 25 mL via ORAL

## 2015-04-28 MED ORDER — CIPROFLOXACIN IN D5W 400 MG/200ML IV SOLN
400.0000 mg | Freq: Once | INTRAVENOUS | Status: AC
  Administered 2015-04-28: 400 mg via INTRAVENOUS
  Filled 2015-04-28: qty 200

## 2015-04-28 MED ORDER — ONDANSETRON HCL 4 MG/2ML IJ SOLN
4.0000 mg | Freq: Once | INTRAMUSCULAR | Status: AC
  Administered 2015-04-28: 4 mg via INTRAVENOUS
  Filled 2015-04-28: qty 2

## 2015-04-28 MED ORDER — METRONIDAZOLE IN NACL 5-0.79 MG/ML-% IV SOLN
500.0000 mg | Freq: Once | INTRAVENOUS | Status: DC
  Filled 2015-04-28: qty 100

## 2015-04-28 MED ORDER — MOMETASONE FURO-FORMOTEROL FUM 100-5 MCG/ACT IN AERO
2.0000 | INHALATION_SPRAY | Freq: Two times a day (BID) | RESPIRATORY_TRACT | Status: DC
  Administered 2015-04-29 – 2015-04-30 (×3): 2 via RESPIRATORY_TRACT
  Filled 2015-04-28: qty 8.8

## 2015-04-28 MED ORDER — SODIUM CHLORIDE 0.9 % IV BOLUS (SEPSIS)
1000.0000 mL | Freq: Once | INTRAVENOUS | Status: AC
  Administered 2015-04-28: 1000 mL via INTRAVENOUS

## 2015-04-28 MED ORDER — MORPHINE SULFATE (PF) 2 MG/ML IV SOLN
2.0000 mg | INTRAVENOUS | Status: DC | PRN
Start: 2015-04-28 — End: 2015-04-30
  Administered 2015-04-28 – 2015-04-29 (×3): 2 mg via INTRAVENOUS
  Filled 2015-04-28 (×3): qty 1

## 2015-04-28 MED ORDER — INSULIN ASPART 100 UNIT/ML ~~LOC~~ SOLN
0.0000 [IU] | Freq: Three times a day (TID) | SUBCUTANEOUS | Status: DC
  Administered 2015-04-29: 5 [IU] via SUBCUTANEOUS
  Administered 2015-04-29 – 2015-04-30 (×3): 3 [IU] via SUBCUTANEOUS
  Administered 2015-04-30: 5 [IU] via SUBCUTANEOUS

## 2015-04-28 MED ORDER — MORPHINE SULFATE (PF) 4 MG/ML IV SOLN
4.0000 mg | Freq: Once | INTRAVENOUS | Status: AC
  Administered 2015-04-28: 4 mg via INTRAVENOUS
  Filled 2015-04-28: qty 1

## 2015-04-28 MED ORDER — DIPHENHYDRAMINE HCL 50 MG/ML IJ SOLN
25.0000 mg | Freq: Once | INTRAMUSCULAR | Status: DC
  Filled 2015-04-28: qty 1

## 2015-04-28 MED ORDER — IOHEXOL 300 MG/ML  SOLN
100.0000 mL | Freq: Once | INTRAMUSCULAR | Status: AC | PRN
Start: 2015-04-28 — End: 2015-04-28
  Administered 2015-04-28: 100 mL via INTRAVENOUS

## 2015-04-28 MED ORDER — ENOXAPARIN SODIUM 40 MG/0.4ML ~~LOC~~ SOLN
40.0000 mg | SUBCUTANEOUS | Status: DC
  Filled 2015-04-28 (×2): qty 0.4

## 2015-04-28 MED ORDER — METRONIDAZOLE IN NACL 5-0.79 MG/ML-% IV SOLN
500.0000 mg | Freq: Three times a day (TID) | INTRAVENOUS | Status: DC
  Administered 2015-04-29 – 2015-04-30 (×5): 500 mg via INTRAVENOUS
  Filled 2015-04-28 (×4): qty 100

## 2015-04-28 NOTE — ED Notes (Signed)
CT notified that patient is done with contrast

## 2015-04-28 NOTE — ED Notes (Signed)
Pt. Reports having 2 days of rectal bleeding and mucous.  She denies any stool.  She also having lower abdominal pain for 2 days.  She denies any n/v. Skin is warm and dry.

## 2015-04-28 NOTE — Progress Notes (Signed)
Patient admitted into 5w 60  through the ED with blood in her stools. On arrival to the floor she is alert and oriented. Oriented to the room and educated her on the needs to use the call light button and the phone in reaching staff when in need of something. Placed her on cardiac monitor and made her comfortable in bed. Call light placed within reach and bed alarm set on. Will keep monitoring.

## 2015-04-28 NOTE — ED Notes (Signed)
CBG is 251.

## 2015-04-28 NOTE — ED Notes (Signed)
Yao, MD at bedside.  

## 2015-04-28 NOTE — Progress Notes (Signed)
ANTIBIOTIC CONSULT NOTE - FOLLOW UP  Pharmacy Consult for Cipro  Indication: Intra-abdominal infection  Patient Measurements: Height: 4\' 11"  (149.9 cm) Weight: 117 lb 1 oz (53.1 kg) IBW/kg (Calculated) : 43.2  Vital Signs: Temp: 98.4 F (36.9 C) (10/28 2352) Temp Source: Oral (10/28 2352) BP: 112/43 mmHg (10/28 2352) Pulse Rate: 63 (10/28 2352)  Labs:  Recent Labs  04/28/15 1827  WBC 10.6*  HGB 14.2  PLT 177  CREATININE 0.92    Anti-infectives    Start     Dose/Rate Route Frequency Ordered Stop   04/29/15 0500  metroNIDAZOLE (FLAGYL) IVPB 500 mg     500 mg 100 mL/hr over 60 Minutes Intravenous Every 8 hours 04/28/15 2357     04/28/15 2130  ciprofloxacin (CIPRO) IVPB 400 mg     400 mg 200 mL/hr over 60 Minutes Intravenous  Once 04/28/15 2122 04/28/15 2310   04/28/15 2130  metroNIDAZOLE (FLAGYL) IVPB 500 mg     500 mg 100 mL/hr over 60 Minutes Intravenous  Once 04/28/15 2122        Assessment: 54 y/o F with abdominal pain, starting cipro per rx/flagyl per MD, WBC 10.6, renal function good, other labs as above.   Plan:  -Cipro 400 mg IV q12h -Flagyl per MD -F/U infectious work-up  Narda Bonds 04/28/2015,11:59 PM

## 2015-04-28 NOTE — ED Provider Notes (Signed)
CSN: [id number]     Arrival date & time 04/28/15  1702 History   First MD Initiated Contact with Patient 04/28/15 1718     Chief Complaint  Patient presents with  . Rectal Bleeding     (Consider location/radiation/quality/duration/timing/severity/associated sxs/prior Treatment) The history is provided by the patient.  [name] is a 54 y.o. female hx DM, rheumatoid arthritis, here with abdominal pain, rectal bleeding. Patient started having left lower quadrant for the last 2 days. Pain is constant and also associated with some blood in her stool. Since that she is asked to constipated for the last 3 days but has blood coming out when she tries to have a bowel movement. Denies any fevers or chills. Denies any urinary symptoms. States that this is similar to previous diverticulitis.     Past Medical History  Diagnosis Date  . Drug addiction ([place]) clean for 18 months    cocaine  . Diabetes mellitus   . Diverticulosis   . Internal hemorrhoids   . Depression   . Diabetic neuropathy ([place])   . GERD (gastroesophageal reflux disease)   . Fibromyalgia   . Hyperlipidemia   . Rheumatoid arthritis(714.0)   . Anxiety   . Arthritis   . PONV (postoperative nausea and vomiting)   . Wears glasses   . Full dentures   . COPD (chronic obstructive pulmonary disease) [facility])    Past Surgical History  Procedure Laterality Date  . Cataract extraction  2009    bilateral  . Appendectomy    . Cesarean section    . Hernia repair      incisional  . Rotatator cuff repair  2001    left  . Dental surgery  2011    all teeth removed  . Tubal ligation  2002  . Tonsillectomy    . Knee arthroscopy  Right  . Knee arthroscopy      lt and rt-2014  . Eye surgery  08,09    both cataracts  . Trigger finger release Right 12/20/2013    Procedure: RIGHT THUMB TRIGGER  RELEASE ;  Surgeon: Tennis Must, MD;  Location: [facility];  Service: Orthopedics;  Laterality: Right;   Family History   Problem Relation Age of Onset  . Breast cancer Mother   . Breast cancer Maternal Aunt   . Colon cancer Maternal Grandfather   . Diabetes Paternal Grandmother   . Heart failure Maternal Grandmother     CHF   Social History  Substance Use Topics  . Smoking status: Current Some Day Smoker -- 0.50 packs/day    Types: Cigarettes  . Smokeless tobacco: Never Used  . Alcohol Use: No   OB History    No data available     Review of Systems  Gastrointestinal: Positive for abdominal pain and hematochezia.  All other systems reviewed and are negative.     Allergies  Penicillins; Sulfa antibiotics; Statins; Codeine; and Keflet  Home Medications   Prior to Admission medications   Medication Sig Start Date End Date Taking? Authorizing Provider  albuterol (PROVENTIL HFA;VENTOLIN HFA) 108 (90 BASE) MCG/ACT inhaler Inhale 1 puff into the lungs every 6 (six) hours as needed for wheezing or shortness of breath.    Yes Historical Provider, MD  aspirin 81 MG tablet Take 81 mg by mouth daily.   Yes Historical Provider, MD  fluticasone-salmeterol (ADVAIR HFA) 115-21 MCG/ACT inhaler Inhale 1 puff into the lungs daily as needed (shortness).    Yes Historical Provider,  MD  HYDROcodone-acetaminophen (NORCO) 10-325 MG tablet Take 1 tablet by mouth 3 (three) times daily. 04/14/15  Yes Historical Provider, MD  insulin lispro (HUMALOG) 100 UNIT/ML injection Inject into the skin every morning. Insulin pump-base rate-   Yes Historical Provider, MD  lisinopril (PRINIVIL,ZESTRIL) 5 MG tablet Take 5 mg by mouth at bedtime. 03/17/15  Yes Historical Provider, MD  LIVALO 4 MG TABS Take 4 mg by mouth daily. 02/10/15  Yes Historical Provider, MD  tiZANidine (ZANAFLEX) 4 MG tablet Take 4 mg by mouth daily as needed for muscle spasms.  04/21/15  Yes Historical Provider, MD  methocarbamol (ROBAXIN) 500 MG tablet Take 1 tablet (500 mg total) by mouth 2 (two) times daily as needed for muscle spasms. Patient not taking:  Reported on 04/28/2015 06/07/14   Star Age, MD   BP 116/63 mmHg  Pulse 65  Temp(Src) 98.3 F (36.8 C) (Oral)  Resp 16  SpO2 97% Physical Exam  Constitutional: She is oriented to person, place, and time. She appears well-developed.  Uncomfortable   HENT:  Head: Normocephalic.  Mouth/Throat: Oropharynx is clear and moist.  Eyes: Pupils are equal, round, and reactive to light.  Neck: Normal range of motion. Neck supple.  Cardiovascular: Normal rate, regular rhythm and normal heart sounds.   Pulmonary/Chest: Effort normal and breath sounds normal. No respiratory distress. She has no wheezes. She has no rales.  Abdominal: Soft. Bowel sounds are normal.  + LLQ tenderness, mild rebound   Genitourinary:  Small external hemorrhoid, no active bleeding. Occ positive stool   Musculoskeletal: Normal range of motion. She exhibits no edema or tenderness.  Neurological: She is alert and oriented to person, place, and time. No cranial nerve deficit. Coordination normal.  Skin: Skin is warm and dry.  Psychiatric: She has a normal mood and affect. Her behavior is normal. Judgment and thought content normal.  Nursing note and vitals reviewed.   ED Course  Procedures (including critical care time) Labs Review Labs Reviewed  COMPREHENSIVE METABOLIC PANEL - Abnormal; Notable for the following:    Sodium 133 (*)    Chloride 98 (*)    Glucose, Bld 433 (*)    All other components within normal limits  CBC - Abnormal; Notable for the following:    WBC 10.6 (*)    All other components within normal limits  URINALYSIS, ROUTINE W REFLEX MICROSCOPIC (NOT AT [facility]) - Abnormal; Notable for the following:    Glucose, UA >1000 (*)    All other components within normal limits  POC OCCULT BLOOD, ED - Abnormal; Notable for the following:    Fecal Occult Bld POSITIVE (*)    All other components within normal limits  CBG MONITORING, ED - Abnormal; Notable for the following:    Glucose-Capillary 251 (*)     All other components within normal limits  URINE MICROSCOPIC-ADD ON  TYPE AND SCREEN  ABO/RH    Imaging Review Ct Abdomen Pelvis W Contrast  04/28/2015  CLINICAL DATA:  Lower abdominal pain x2 days, blood in stool EXAM: CT ABDOMEN AND PELVIS WITH CONTRAST TECHNIQUE: Multidetector CT imaging of the abdomen and pelvis was performed using the standard protocol following bolus administration of intravenous contrast. CONTRAST:  176mL OMNIPAQUE IOHEXOL 300 MG/ML  SOLN COMPARISON:  02/17/2014 FINDINGS: Lower chest: Mild subpleural reticulation/paraseptal emphysematous changes at the lung bases. Hepatobiliary: Liver is within normal limits. Gallbladder is unremarkable. No intrahepatic or extrahepatic ductal dilatation. Pancreas: Within normal limits. Spleen: Within normal limits. Adrenals/Urinary Tract: Adrenal glands  are within normal limits. Kidneys are within normal limits.  No hydronephrosis. Bladder is within normal limits. Stomach/Bowel: Stomach is within normal limits. No evidence of bowel obstruction. Prior appendectomy. Sigmoid diverticulosis. Mild wall thickening/inflammatory changes involving the descending colon (series 2/image 60). Additional wall thickening involving the sigmoid colon (series 2/image 72), some of which may reflect chronic mucosal hypertrophy. Overall, this appearance suggests descending colonic/sigmoid diverticulitis. No drainable fluid collection/abscess.  No free air. Vascular/Lymphatic: Atherosclerotic calcifications of the abdominal aorta and branch vessels. No suspicious abdominopelvic lymphadenopathy. Reproductive: Uterus is within normal limits. Bilateral ovaries are within normal limits. Other: No abdominopelvic ascites. Musculoskeletal: Degenerative changes of the visualized thoracolumbar spine. IMPRESSION: Colonic diverticulosis with suspected descending colonic/sigmoid diverticulitis. No drainable fluid collection/abscess. No free air to suggest macroscopic perforation.  Given focal thickening along the descending colon, follow-up colonoscopy is suggested. Electronically Signed   By: Julian Hy M.D.   On: 04/28/2015 21:07   I have personally reviewed and evaluated these images and lab results as part of my medical decision-making.   EKG Interpretation None      MDM   Final diagnoses:  None   Charlott Calvario is a 54 y.o. female LLQ pain. Likely diverticulitis with rectal bleeding. Will get labs, CT ab/pel.   10:07 PM CT showed diverticulitis and diverticulosis. Had 4-5 episodes of bloody stools today. Hg 14. Given cipro/flagyl. Consulted Dr. Michail Sermon from GI. Will admit for observation. Of note, patient didn't use her insulin pump today so CBG 400, AG 13. Given 1L NS and subQ insulin and CBG dropped to 251.      Wandra Arthurs, MD 04/28/15 2220

## 2015-04-28 NOTE — ED Notes (Signed)
Attempted report 

## 2015-04-28 NOTE — ED Notes (Signed)
Pt reported some itching and hives while receiving cipro. Pt states that she is ok now.

## 2015-04-28 NOTE — H&P (Signed)
History and Physical  [name] PYP:[id number] DOB: 10/13/1960 DOA: 04/28/2015  PCP: Lottie Dawson, MD   Chief Complaint: Abd pain  History of Present Illness:  - Patient is a 54 yo f with history of DM II, TIA, diverticulosis, internal hemorrhoids came with cc of abdominal pain and hematochezia.  - Symptoms started 2 days ago. Pain in LLQ area with no no BM in two days except for blood and loss of appetite. No N/V/D/fever but she had some chills.  - No chest pain/ dyspnea/cough/dysuria.  - She had a prior episode of hematochezia in 2012 with colonoscopy showing : diverticulosis, Int hemorrhoids and polyp. She said another colonoscopy was done last year with "normal " result as she was told.   Review of Systems:  CONSTITUTIONAL:  No night sweats.  No fatigue, malaise, lethargy.  No fever +chills. Eyes:  No visual changes.  No eye pain.  No eye discharge.   ENT:    No epistaxis.  No sinus pain.  No sore throat.  No ear pain.  No congestion. RESPIRATORY:  No cough.  No wheeze.  No hemoptysis.  No shortness of breath. CARDIOVASCULAR:  No chest pains.  No palpitations. GASTROINTESTINAL:  +abdominal pain.  No nausea or vomiting.  No diarrhea or constipation.  No hematemesis.  +hematochezia.  No melena. GENITOURINARY:  No urgency.  No frequency.  No dysuria.  No hematuria.  No obstructive symptoms.  No discharge.  No pain.  No significant abnormal bleeding. MUSCULOSKELETAL:  No musculoskeletal pain.  No joint swelling.  No arthritis. NEUROLOGICAL:  No confusion.  No weakness. No headache. No seizure. PSYCHIATRIC:  No depression. No anxiety. No suicidal ideation. SKIN:  No rashes.  No lesions.  No wounds. ENDOCRINE:  No unexplained weight loss.  No polydipsia.  No polyuria.  No polyphagia. HEMATOLOGIC:  No anemia.  No purpura.  No petechiae.  No bleeding.  ALLERGIC AND IMMUNOLOGIC:  No pruritus.  No swelling Other:  Past Medical and Surgical History:   Past  Medical History  Diagnosis Date  . Drug addiction ([place]) clean for 18 months    cocaine  . Diabetes mellitus   . Diverticulosis   . Internal hemorrhoids   . Depression   . Diabetic neuropathy ([place])   . GERD (gastroesophageal reflux disease)   . Fibromyalgia   . Hyperlipidemia   . Rheumatoid arthritis(714.0)   . Anxiety   . Arthritis   . PONV (postoperative nausea and vomiting)   . Wears glasses   . Full dentures   . COPD (chronic obstructive pulmonary disease) [facility])    Past Surgical History  Procedure Laterality Date  . Cataract extraction  2009    bilateral  . Appendectomy    . Cesarean section    . Hernia repair      incisional  . Rotatator cuff repair  2001    left  . Dental surgery  2011    all teeth removed  . Tubal ligation  2002  . Tonsillectomy    . Knee arthroscopy  Right  . Knee arthroscopy      lt and rt-2014  . Eye surgery  08,09    both cataracts  . Trigger finger release Right 12/20/2013    Procedure: RIGHT THUMB TRIGGER  RELEASE ;  Surgeon: Tennis Must, MD;  Location: [facility];  Service: Orthopedics;  Laterality: Right;    Social History:   reports that she has been smoking Cigarettes.  She has been  smoking about 0.50 packs per day. She has never used smokeless tobacco. She reports that she does not drink alcohol or use illicit drugs.     Family History  Problem Relation Age of Onset  . Breast cancer Mother   . Breast cancer Maternal Aunt   . Colon cancer Maternal Grandfather   . Diabetes Paternal Grandmother   . Heart failure Maternal Grandmother     CHF      Prior to Admission medications   Medication Sig Start Date End Date Taking? Authorizing Provider  albuterol (PROVENTIL HFA;VENTOLIN HFA) 108 (90 BASE) MCG/ACT inhaler Inhale 1 puff into the lungs every 6 (six) hours as needed for wheezing or shortness of breath.    Yes Historical Provider, MD  aspirin 81 MG tablet Take 81 mg by mouth daily.   Yes Historical Provider,  MD  fluticasone-salmeterol (ADVAIR HFA) 115-21 MCG/ACT inhaler Inhale 1 puff into the lungs daily as needed (shortness).    Yes Historical Provider, MD  HYDROcodone-acetaminophen (NORCO) 10-325 MG tablet Take 1 tablet by mouth 3 (three) times daily. 04/14/15  Yes Historical Provider, MD  insulin lispro (HUMALOG) 100 UNIT/ML injection Inject into the skin every morning. Insulin pump-base rate-   Yes Historical Provider, MD  lisinopril (PRINIVIL,ZESTRIL) 5 MG tablet Take 5 mg by mouth at bedtime. 03/17/15  Yes Historical Provider, MD  LIVALO 4 MG TABS Take 4 mg by mouth daily. 02/10/15  Yes Historical Provider, MD  tiZANidine (ZANAFLEX) 4 MG tablet Take 4 mg by mouth daily as needed for muscle spasms.  04/21/15  Yes Historical Provider, MD  methocarbamol (ROBAXIN) 500 MG tablet Take 1 tablet (500 mg total) by mouth 2 (two) times daily as needed for muscle spasms. Patient not taking: Reported on 04/28/2015 06/07/14   Star Age, MD    Physical Exam: BP 116/63 mmHg  Pulse 65  Temp(Src) 98.3 F (36.8 C) (Oral)  Resp 16  SpO2 97%  GENERAL : Well developed, well nourished, alert and cooperative, and appears to be in no acute distress. HEAD: normocephalic. EYES: PERRL, EOMI. EARS: , hearing grossly intact. NOSE: No nasal discharge. THROAT: Oral cavity and pharynx normal. No inflammation, swelling, exudate, or lesions.  NECK: Neck supple, non-tender  CARDIAC: Normal S1 and S2. No S3, S4 or murmurs. Rhythm is regular. There is no peripheral edema. LUNGS: Clear to auscultation and percussion without rales, rhonchi, wheezing or diminished breath sounds. ABDOMEN: Positive bowel sounds. Soft, nondistended, LLQ tenderness.. No rebound. No masses. EXTREMITIES: No significant deformity or joint abnormality. No edema. NEUROLOGICAL: The mental examination revealed the patient was oriented to person, place, and time.CN II-XII intact. Strength and sensation symmetric and intact throughout. SKIN: Skin normal  color, texture and turgor with no lesions or eruptions. PSYCHIATRIC:  The patient was able to demonstrate good judgement and reason, without hallucinations, abnormal affect or abnormal behaviors during the examination. Patient is not suicidal.          Labs on Admission:  Reviewed.   Radiological Exams on Admission: Ct Abdomen Pelvis W Contrast  04/28/2015  CLINICAL DATA:  Lower abdominal pain x2 days, blood in stool EXAM: CT ABDOMEN AND PELVIS WITH CONTRAST TECHNIQUE: Multidetector CT imaging of the abdomen and pelvis was performed using the standard protocol following bolus administration of intravenous contrast. CONTRAST:  134mL OMNIPAQUE IOHEXOL 300 MG/ML  SOLN COMPARISON:  02/17/2014 FINDINGS: Lower chest: Mild subpleural reticulation/paraseptal emphysematous changes at the lung bases. Hepatobiliary: Liver is within normal limits. Gallbladder is unremarkable. No intrahepatic or  extrahepatic ductal dilatation. Pancreas: Within normal limits. Spleen: Within normal limits. Adrenals/Urinary Tract: Adrenal glands are within normal limits. Kidneys are within normal limits.  No hydronephrosis. Bladder is within normal limits. Stomach/Bowel: Stomach is within normal limits. No evidence of bowel obstruction. Prior appendectomy. Sigmoid diverticulosis. Mild wall thickening/inflammatory changes involving the descending colon (series 2/image 60). Additional wall thickening involving the sigmoid colon (series 2/image 72), some of which may reflect chronic mucosal hypertrophy. Overall, this appearance suggests descending colonic/sigmoid diverticulitis. No drainable fluid collection/abscess.  No free air. Vascular/Lymphatic: Atherosclerotic calcifications of the abdominal aorta and branch vessels. No suspicious abdominopelvic lymphadenopathy. Reproductive: Uterus is within normal limits. Bilateral ovaries are within normal limits. Other: No abdominopelvic ascites. Musculoskeletal: Degenerative changes of the  visualized thoracolumbar spine. IMPRESSION: Colonic diverticulosis with suspected descending colonic/sigmoid diverticulitis. No drainable fluid collection/abscess. No free air to suggest macroscopic perforation. Given focal thickening along the descending colon, follow-up colonoscopy is suggested. Electronically Signed   By: Julian Hy M.D.   On: 04/28/2015 21:07     Assessment/Plan  Acute diverticulitis:  Will keep NPO IV morphine prn pain Cipro/Flagyl started GI consult  PPI IV IVF : 1/2 NS / D5  DMII:  On insulin pump at home : turned off today Will start SS correction Q46H Hold lisinopril Hold asp ( bleeding but HB is stable)  COPD: continue home meds   DVT prophylaxis: [place] enoxaparin  GI prophylaxis: PPI Consultants: GI Code Status: full     Gennaro Africa M.D [facility]

## 2015-04-29 LAB — CBC WITH DIFFERENTIAL/PLATELET
Basophils Absolute: 0 10*3/uL (ref 0.0–0.1)
Basophils Relative: 0 %
EOS PCT: 5 %
Eosinophils Absolute: 0.5 10*3/uL (ref 0.0–0.7)
HEMATOCRIT: 36 % (ref 36.0–46.0)
Hemoglobin: 12.1 g/dL (ref 12.0–15.0)
LYMPHS ABS: 4.4 10*3/uL — AB (ref 0.7–4.0)
LYMPHS PCT: 45 %
MCH: 30.4 pg (ref 26.0–34.0)
MCHC: 33.6 g/dL (ref 30.0–36.0)
MCV: 90.5 fL (ref 78.0–100.0)
MONO ABS: 0.6 10*3/uL (ref 0.1–1.0)
Monocytes Relative: 6 %
Neutro Abs: 4.4 10*3/uL (ref 1.7–7.7)
Neutrophils Relative %: 44 %
PLATELETS: 151 10*3/uL (ref 150–400)
RBC: 3.98 MIL/uL (ref 3.87–5.11)
RDW: 13.7 % (ref 11.5–15.5)
WBC: 9.9 10*3/uL (ref 4.0–10.5)

## 2015-04-29 LAB — COMPREHENSIVE METABOLIC PANEL
ALBUMIN: 3.1 g/dL — AB (ref 3.5–5.0)
ALT: 14 U/L (ref 14–54)
AST: 14 U/L — AB (ref 15–41)
Alkaline Phosphatase: 60 U/L (ref 38–126)
Anion gap: 5 (ref 5–15)
BUN: 8 mg/dL (ref 6–20)
CHLORIDE: 104 mmol/L (ref 101–111)
CO2: 24 mmol/L (ref 22–32)
Calcium: 8.3 mg/dL — ABNORMAL LOW (ref 8.9–10.3)
Creatinine, Ser: 0.95 mg/dL (ref 0.44–1.00)
GFR calc Af Amer: >60 mL/min (ref >60)
GFR calc non Af Amer: >60 mL/min (ref >60)
GLUCOSE: 205 mg/dL — AB (ref 65–99)
POTASSIUM: 3.6 mmol/L (ref 3.5–5.1)
SODIUM: 133 mmol/L — AB (ref 135–145)
Total Bilirubin: 0.4 mg/dL (ref 0.3–1.2)
Total Protein: 5.8 g/dL — ABNORMAL LOW (ref 6.5–8.1)

## 2015-04-29 LAB — GLUCOSE, CAPILLARY
GLUCOSE-CAPILLARY: 240 mg/dL — AB (ref 65–99)
GLUCOSE-CAPILLARY: 224 mg/dL — AB (ref 65–99)
GLUCOSE-CAPILLARY: 291 mg/dL — AB (ref 65–99)
Glucose-Capillary: 201 mg/dL — ABNORMAL HIGH (ref 65–99)

## 2015-04-29 MED ORDER — INSULIN GLARGINE 100 UNIT/ML ~~LOC~~ SOLN
10.0000 [IU] | Freq: Every day | SUBCUTANEOUS | Status: DC
  Administered 2015-04-29 – 2015-04-30 (×2): 10 [IU] via SUBCUTANEOUS
  Filled 2015-04-29 (×2): qty 0.1

## 2015-04-29 MED ORDER — CIPROFLOXACIN IN D5W 400 MG/200ML IV SOLN
400.0000 mg | Freq: Two times a day (BID) | INTRAVENOUS | Status: DC
Start: 2015-04-29 — End: 2015-04-30
  Administered 2015-04-29 – 2015-04-30 (×3): 400 mg via INTRAVENOUS
  Filled 2015-04-29 (×3): qty 200

## 2015-04-29 MED ORDER — PNEUMOCOCCAL VAC POLYVALENT 25 MCG/0.5ML IJ INJ
0.5000 mL | INJECTION | INTRAMUSCULAR | Status: DC
  Filled 2015-04-29: qty 0.5

## 2015-04-29 NOTE — Consult Note (Signed)
Referring Provider: Dr. Darl Householder Primary Care Physician:  Kelton Pillar, Herschell Dimes, MD Primary Gastroenterologist:  Dr. Paulita Fujita  Reason for Consultation:  Rectal bleeding  HPI: [name] is a 54 y.o. female seen for a consult due to rectal bleeding stating that she had the acute onset of LLQ pain and bright red blood per rectum 2 days ago. Pain is constant and sharp and mainly in LLQ but also having pain at umbilicus and in the suprapubic area. Denies diarrhea with the bleeding and reports her last BM was 3 days ago and was formed. Denies fevers or chills. CT scan shows diverticulitis of the distal descending colon. Denies previous history of diverticulitis or diverticular bleeding. Colonoscopy (01/2014) showed sigmoid diverticulosis and internal hemorrhoids. CT in 01/2014 that was done for chronic lower abdominal pain did not show any diverticulitis. No bleeding since admit. Wants to eat.   Past Medical History  Diagnosis Date  . Drug addiction ([place]) clean for 18 months    cocaine  . Diabetes mellitus   . Diverticulosis   . Internal hemorrhoids   . Depression   . Diabetic neuropathy ([place])   . GERD (gastroesophageal reflux disease)   . Fibromyalgia   . Hyperlipidemia   . Rheumatoid arthritis(714.0)   . Anxiety   . Arthritis   . PONV (postoperative nausea and vomiting)   . Wears glasses   . Full dentures   . COPD (chronic obstructive pulmonary disease) [facility])     Past Surgical History  Procedure Laterality Date  . Cataract extraction  2009    bilateral  . Appendectomy    . Cesarean section    . Hernia repair      incisional  . Rotatator cuff repair  2001    left  . Dental surgery  2011    all teeth removed  . Tubal ligation  2002  . Tonsillectomy    . Knee arthroscopy  Right  . Knee arthroscopy      lt and rt-2014  . Eye surgery  08,09    both cataracts  . Trigger finger release Right 12/20/2013    Procedure: RIGHT THUMB TRIGGER  RELEASE ;  Surgeon: Tennis Must,  MD;  Location: [facility];  Service: Orthopedics;  Laterality: Right;    Prior to Admission medications   Medication Sig Start Date End Date Taking? Authorizing Provider  albuterol (PROVENTIL HFA;VENTOLIN HFA) 108 (90 BASE) MCG/ACT inhaler Inhale 1 puff into the lungs every 6 (six) hours as needed for wheezing or shortness of breath.    Yes Historical Provider, MD  aspirin 81 MG tablet Take 81 mg by mouth daily.   Yes Historical Provider, MD  fluticasone-salmeterol (ADVAIR HFA) 115-21 MCG/ACT inhaler Inhale 1 puff into the lungs daily as needed (shortness).    Yes Historical Provider, MD  HYDROcodone-acetaminophen (NORCO) 10-325 MG tablet Take 1 tablet by mouth 3 (three) times daily. 04/14/15  Yes Historical Provider, MD  insulin lispro (HUMALOG) 100 UNIT/ML injection Inject into the skin every morning. Insulin pump-base rate-   Yes Historical Provider, MD  lisinopril (PRINIVIL,ZESTRIL) 5 MG tablet Take 5 mg by mouth at bedtime. 03/17/15  Yes Historical Provider, MD  LIVALO 4 MG TABS Take 4 mg by mouth daily. 02/10/15  Yes Historical Provider, MD  tiZANidine (ZANAFLEX) 4 MG tablet Take 4 mg by mouth daily as needed for muscle spasms.  04/21/15  Yes Historical Provider, MD  methocarbamol (ROBAXIN) 500 MG tablet Take 1 tablet (500 mg total) by mouth  2 (two) times daily as needed for muscle spasms. Patient not taking: Reported on 04/28/2015 06/07/14   Star Age, MD    Scheduled Meds: . sodium chloride   Intravenous STAT  . ciprofloxacin  400 mg Intravenous Q12H  . diphenhydrAMINE  25 mg Intravenous Once  . enoxaparin (LOVENOX) injection  40 mg Subcutaneous Q24H  . insulin aspart  0-9 Units Subcutaneous TID WC  . metronidazole  500 mg Intravenous Q8H  . mometasone-formoterol  2 puff Inhalation BID  . [START ON 04/30/2015] pneumococcal 23 valent vaccine  0.5 mL Intramuscular Tomorrow-1000   Continuous Infusions: . dextrose 5 % and 0.45% NaCl 1,000 mL (04/29/15 0015)   PRN  Meds:.albuterol, morphine injection  Allergies as of 04/28/2015 - Review Complete 04/28/2015  Allergen Reaction Noted  . Penicillins Rash 04/24/2011  . Sulfa antibiotics Hives and Shortness Of Breath 04/24/2011  . Statins Other (See Comments) 03/01/2012  . Codeine Nausea Only 04/24/2011  . Keflet [cephalexin] Palpitations 03/02/2014    Family History  Problem Relation Age of Onset  . Breast cancer Mother   . Breast cancer Maternal Aunt   . Colon cancer Maternal Grandfather   . Diabetes Paternal Grandmother   . Heart failure Maternal Grandmother     CHF    Social History   Social History  . Marital Status: Divorced    Spouse Name: N/A  . Number of Children: 1  . Years of Education: colleg   Occupational History  . disabled    Social History Main Topics  . Smoking status: Current Some Day Smoker -- 0.50 packs/day    Types: Cigarettes  . Smokeless tobacco: Never Used  . Alcohol Use: No  . Drug Use: No     Comment: former cocaine addiction  . Sexual Activity: No   Other Topics Concern  . Not on file   Social History Narrative   Left handed and consumes 1-2 cups daily    Review of Systems: All negative except as stated above in HPI.  Physical Exam: Vital signs: Filed Vitals:   04/28/15 2352  BP: 112/43  Pulse: 63  Temp: 98.4 F (36.9 C)  Resp: 16   Last BM Date: 04/28/15 General:   Elderly, Alert,  Well-developed, well-nourished, pleasant and cooperative in NAD Head: atraumatic Eyes: anicteric ENT: oropharynx clear Neck: supple, nontender Lungs:  Clear throughout to auscultation.   No wheezes, crackles, or rhonchi. No acute distress. Heart:  Regular rate and rhythm; no murmurs, clicks, rubs,  or gallops. Abdomen: LLQ, periumbilical and suprapubic tenderness with guarding, soft, nondistended, +BS  Rectal:  Deferred Ext: pedal pulses intact, no edema Skin: no rash, no jaundice  GI:  Lab Results:  Recent Labs  04/28/15 1827 04/29/15 0450  WBC  10.6* 9.9  HGB 14.2 12.1  HCT 42.1 36.0  PLT 177 151   BMET  Recent Labs  04/28/15 1827 04/29/15 0450  NA 133* 133*  K 4.0 3.6  CL 98* 104  CO2 22 24  GLUCOSE 433* 205*  BUN 13 8  CREATININE 0.92 0.95  CALCIUM 10.0 8.3*   LFT  Recent Labs  04/29/15 0450  PROT 5.8*  ALBUMIN 3.1*  AST 14*  ALT 14  ALKPHOS 60  BILITOT 0.4   PT/INR No results for input(s): LABPROT, INR in the last 72 hours.   Studies/Results: Ct Abdomen Pelvis W Contrast  04/28/2015  CLINICAL DATA:  Lower abdominal pain x2 days, blood in stool EXAM: CT ABDOMEN AND PELVIS WITH CONTRAST TECHNIQUE: Multidetector  CT imaging of the abdomen and pelvis was performed using the standard protocol following bolus administration of intravenous contrast. CONTRAST:  181mL OMNIPAQUE IOHEXOL 300 MG/ML  SOLN COMPARISON:  02/17/2014 FINDINGS: Lower chest: Mild subpleural reticulation/paraseptal emphysematous changes at the lung bases. Hepatobiliary: Liver is within normal limits. Gallbladder is unremarkable. No intrahepatic or extrahepatic ductal dilatation. Pancreas: Within normal limits. Spleen: Within normal limits. Adrenals/Urinary Tract: Adrenal glands are within normal limits. Kidneys are within normal limits.  No hydronephrosis. Bladder is within normal limits. Stomach/Bowel: Stomach is within normal limits. No evidence of bowel obstruction. Prior appendectomy. Sigmoid diverticulosis. Mild wall thickening/inflammatory changes involving the descending colon (series 2/image 60). Additional wall thickening involving the sigmoid colon (series 2/image 72), some of which may reflect chronic mucosal hypertrophy. Overall, this appearance suggests descending colonic/sigmoid diverticulitis. No drainable fluid collection/abscess.  No free air. Vascular/Lymphatic: Atherosclerotic calcifications of the abdominal aorta and branch vessels. No suspicious abdominopelvic lymphadenopathy. Reproductive: Uterus is within normal limits. Bilateral  ovaries are within normal limits. Other: No abdominopelvic ascites. Musculoskeletal: Degenerative changes of the visualized thoracolumbar spine. IMPRESSION: Colonic diverticulosis with suspected descending colonic/sigmoid diverticulitis. No drainable fluid collection/abscess. No free air to suggest macroscopic perforation. Given focal thickening along the descending colon, follow-up colonoscopy is suggested. Electronically Signed   By: Julian Hy M.D.   On: 04/28/2015 21:07    Impression/Plan: 54 yo with acute onset of rectal bleeding and LLQ pain due to diverticulitis and diverticular bleeding who is hemodynamically stable. Supportive care. No role for repeat colonoscopy at this time. Advance diet as tolerated. Will sign off. Call if questions.     LOS: 1 day   [place] C.  04/29/2015, 10:37 AM  Pager [phone]  If no answer or after 5 PM call [phone]

## 2015-04-29 NOTE — Progress Notes (Signed)
[facility] PROGRESS NOTE  [name] RFF:[id number] DOB: Sep 04, 1960 DOA: 04/28/2015 PCP: Lottie Dawson, MD  Length of stay: 1   Assessment/Plan: Active Problems:   Diverticulitis   54 yo f with history of DM II, TIA, diverticulosis, internal hemorrhoids came with cc of abdominal pain and hematochezia. - Symptoms started 2 days ago. Pain in LLQ area with no no BM in two days except for blood and loss of appetite. No N/V/D/fever but she had some chills. Patient found to have acute diverticulitis on CT scan. She had a prior episode of hematochezia in 2012 with colonoscopy showing : diverticulosis, Int hemorrhoids and polyp. She said another colonoscopy was done last year with "normal " result as she was told.   Assessment and plan Acute diverticulitis Continue IV fluids, Flagyl/ciprofloxacin, continue clear liquid diet for now, slowly advance as tolerated. Patient states that her rectal bleeding has stopped, no further episodes of bleeding overnight  Diabetes mellitus type 2, will check hemoglobin A1c, start patient on Lantus 10 units as CBG is uncontrolled, and continue sliding scale insulin.  Hypertension hold lisinopril  Fibromyalgia continue Zanaflex  Rheumatoid arthritis-continue Zanaflex  COPD continue when necessary nebulizer treatments, Advair    DVT prophylaxsis Lovenox  Code Status:      Code Status Orders        Start     Ordered   04/28/15 2358  Full code   Continuous     04/28/15 2357     Family Communication: family updated about patient's clinical progress Disposition Plan:  Anticipate discharge in 2-3 days       Consultants:  None  Procedures:  None  Antibiotics: Anti-infectives    Start     Dose/Rate Route Frequency Ordered Stop   04/29/15 1000  ciprofloxacin (CIPRO) IVPB 400 mg     400 mg 200 mL/hr over 60 Minutes Intravenous Every 12 hours 04/29/15 0004     04/29/15 0030  metroNIDAZOLE (FLAGYL) IVPB 500  mg     500 mg 100 mL/hr over 60 Minutes Intravenous Every 8 hours 04/28/15 2357     04/28/15 2130  ciprofloxacin (CIPRO) IVPB 400 mg     400 mg 200 mL/hr over 60 Minutes Intravenous  Once 04/28/15 2122 04/28/15 2310   04/28/15 2130  metroNIDAZOLE (FLAGYL) IVPB 500 mg  Status:  Discontinued     500 mg 100 mL/hr over 60 Minutes Intravenous  Once 04/28/15 2122 04/29/15 0012         HPI/Subjective: Patient endorses improved nausea and abdominal pain, no further episodes of rectal bleeding overnight  Objective: Filed Vitals:   04/28/15 2318 04/28/15 2351 04/28/15 2352 04/29/15 0637  BP: 107/52  112/43   Pulse: 62  63   Temp:   98.4 F (36.9 C)   TempSrc:   Oral   Resp: 16  16   Height:  4\' 11"  (1.499 m)    Weight:  53.1 kg (117 lb 1 oz)  53.1 kg (117 lb 1 oz)  SpO2: 98%  98%     Intake/Output Summary (Last 24 hours) at 04/29/15 1048 Last data filed at 04/29/15 0900  Gross per 24 hour  Intake      0 ml  Output      0 ml  Net      0 ml    Exam:  General: No acute respiratory distress Lungs: Clear to auscultation bilaterally without wheezes or crackles Cardiovascular: Regular rate and rhythm without murmur  gallop or rub normal S1 and S2 Abdomen: Nontender, nondistended, soft, bowel sounds positive, no rebound, no ascites, no appreciable mass Extremities: No significant cyanosis, clubbing, or edema bilateral lower extremities     Data Review   Micro Results No results found for this or any previous visit (from the past 240 hour(s)).  Radiology Reports Ct Abdomen Pelvis W Contrast  04/28/2015  CLINICAL DATA:  Lower abdominal pain x2 days, blood in stool EXAM: CT ABDOMEN AND PELVIS WITH CONTRAST TECHNIQUE: Multidetector CT imaging of the abdomen and pelvis was performed using the standard protocol following bolus administration of intravenous contrast. CONTRAST:  173mL OMNIPAQUE IOHEXOL 300 MG/ML  SOLN COMPARISON:  02/17/2014 FINDINGS: Lower chest: Mild subpleural  reticulation/paraseptal emphysematous changes at the lung bases. Hepatobiliary: Liver is within normal limits. Gallbladder is unremarkable. No intrahepatic or extrahepatic ductal dilatation. Pancreas: Within normal limits. Spleen: Within normal limits. Adrenals/Urinary Tract: Adrenal glands are within normal limits. Kidneys are within normal limits.  No hydronephrosis. Bladder is within normal limits. Stomach/Bowel: Stomach is within normal limits. No evidence of bowel obstruction. Prior appendectomy. Sigmoid diverticulosis. Mild wall thickening/inflammatory changes involving the descending colon (series 2/image 60). Additional wall thickening involving the sigmoid colon (series 2/image 72), some of which may reflect chronic mucosal hypertrophy. Overall, this appearance suggests descending colonic/sigmoid diverticulitis. No drainable fluid collection/abscess.  No free air. Vascular/Lymphatic: Atherosclerotic calcifications of the abdominal aorta and branch vessels. No suspicious abdominopelvic lymphadenopathy. Reproductive: Uterus is within normal limits. Bilateral ovaries are within normal limits. Other: No abdominopelvic ascites. Musculoskeletal: Degenerative changes of the visualized thoracolumbar spine. IMPRESSION: Colonic diverticulosis with suspected descending colonic/sigmoid diverticulitis. No drainable fluid collection/abscess. No free air to suggest macroscopic perforation. Given focal thickening along the descending colon, follow-up colonoscopy is suggested. Electronically Signed   By: Julian Hy M.D.   On: 04/28/2015 21:07     CBC  Recent Labs Lab 04/28/15 1827 04/29/15 0450  WBC 10.6* 9.9  HGB 14.2 12.1  HCT 42.1 36.0  PLT 177 151  MCV 89.6 90.5  MCH 30.2 30.4  MCHC 33.7 33.6  RDW 13.6 13.7  LYMPHSABS  --  4.4*  MONOABS  --  0.6  EOSABS  --  0.5  BASOSABS  --  0.0    Chemistries   Recent Labs Lab 04/28/15 1827 04/29/15 0450  NA 133* 133*  K 4.0 3.6  CL 98* 104  CO2  22 24  GLUCOSE 433* 205*  BUN 13 8  CREATININE 0.92 0.95  CALCIUM 10.0 8.3*  AST 18 14*  ALT 16 14  ALKPHOS 90 60  BILITOT 0.5 0.4   ------------------------------------------------------------------------------------------------------------------ estimated creatinine clearance is 49.3 mL/min (by C-G formula based on Cr of 0.95). ------------------------------------------------------------------------------------------------------------------ No results for input(s): HGBA1C in the last 72 hours. ------------------------------------------------------------------------------------------------------------------ No results for input(s): CHOL, HDL, LDLCALC, TRIG, CHOLHDL, LDLDIRECT in the last 72 hours. ------------------------------------------------------------------------------------------------------------------ No results for input(s): TSH, T4TOTAL, T3FREE, THYROIDAB in the last 72 hours.  Invalid input(s): FREET3 ------------------------------------------------------------------------------------------------------------------ No results for input(s): VITAMINB12, FOLATE, FERRITIN, TIBC, IRON, RETICCTPCT in the last 72 hours.  Coagulation profile No results for input(s): INR, PROTIME in the last 168 hours.  No results for input(s): DDIMER in the last 72 hours.  Cardiac Enzymes No results for input(s): CKMB, TROPONINI, MYOGLOBIN in the last 168 hours.  Invalid input(s): CK ------------------------------------------------------------------------------------------------------------------ Invalid input(s): POCBNP   CBG:  Recent Labs Lab 04/28/15 2140 04/28/15 2357 04/29/15 0607  GLUCAP 251* 201* 240*       Studies: Ct Abdomen Pelvis  W Contrast  04/28/2015  CLINICAL DATA:  Lower abdominal pain x2 days, blood in stool EXAM: CT ABDOMEN AND PELVIS WITH CONTRAST TECHNIQUE: Multidetector CT imaging of the abdomen and pelvis was performed using the standard protocol following  bolus administration of intravenous contrast. CONTRAST:  145mL OMNIPAQUE IOHEXOL 300 MG/ML  SOLN COMPARISON:  02/17/2014 FINDINGS: Lower chest: Mild subpleural reticulation/paraseptal emphysematous changes at the lung bases. Hepatobiliary: Liver is within normal limits. Gallbladder is unremarkable. No intrahepatic or extrahepatic ductal dilatation. Pancreas: Within normal limits. Spleen: Within normal limits. Adrenals/Urinary Tract: Adrenal glands are within normal limits. Kidneys are within normal limits.  No hydronephrosis. Bladder is within normal limits. Stomach/Bowel: Stomach is within normal limits. No evidence of bowel obstruction. Prior appendectomy. Sigmoid diverticulosis. Mild wall thickening/inflammatory changes involving the descending colon (series 2/image 60). Additional wall thickening involving the sigmoid colon (series 2/image 72), some of which may reflect chronic mucosal hypertrophy. Overall, this appearance suggests descending colonic/sigmoid diverticulitis. No drainable fluid collection/abscess.  No free air. Vascular/Lymphatic: Atherosclerotic calcifications of the abdominal aorta and branch vessels. No suspicious abdominopelvic lymphadenopathy. Reproductive: Uterus is within normal limits. Bilateral ovaries are within normal limits. Other: No abdominopelvic ascites. Musculoskeletal: Degenerative changes of the visualized thoracolumbar spine. IMPRESSION: Colonic diverticulosis with suspected descending colonic/sigmoid diverticulitis. No drainable fluid collection/abscess. No free air to suggest macroscopic perforation. Given focal thickening along the descending colon, follow-up colonoscopy is suggested. Electronically Signed   By: Julian Hy M.D.   On: 04/28/2015 21:07      Lab Results  Component Value Date   HGBA1C 8.4* 03/14/2014   Lab Results  Component Value Date   LDLCALC 139* 03/14/2014   CREATININE 0.95 04/29/2015       Scheduled Meds: . sodium chloride    Intravenous STAT  . ciprofloxacin  400 mg Intravenous Q12H  . diphenhydrAMINE  25 mg Intravenous Once  . enoxaparin (LOVENOX) injection  40 mg Subcutaneous Q24H  . insulin aspart  0-9 Units Subcutaneous TID WC  . insulin glargine  10 Units Subcutaneous Daily  . metronidazole  500 mg Intravenous Q8H  . mometasone-formoterol  2 puff Inhalation BID  . [START ON 04/30/2015] pneumococcal 23 valent vaccine  0.5 mL Intramuscular Tomorrow-1000   Continuous Infusions: . dextrose 5 % and 0.45% NaCl 1,000 mL (04/29/15 0015)    Active Problems:   Diverticulitis    Time spent: 80 minutes   [facility] Pager [phone]. If 7PM-7AM, please contact night-coverage at [email or web address], password [email or web address] 04/29/2015, 10:48 AM  LOS: 1 day

## 2015-04-30 LAB — COMPREHENSIVE METABOLIC PANEL
ALBUMIN: 3.1 g/dL — AB (ref 3.5–5.0)
ALT: 12 U/L — AB (ref 14–54)
AST: 15 U/L (ref 15–41)
Alkaline Phosphatase: 55 U/L (ref 38–126)
Anion gap: 7 (ref 5–15)
BUN: <5 mg/dL — ABNORMAL LOW (ref 6–20)
CHLORIDE: 104 mmol/L (ref 101–111)
CO2: 23 mmol/L (ref 22–32)
CREATININE: 0.9 mg/dL (ref 0.44–1.00)
Calcium: 8.5 mg/dL — ABNORMAL LOW (ref 8.9–10.3)
GFR calc Af Amer: >60 mL/min (ref >60)
GFR calc non Af Amer: >60 mL/min (ref >60)
GLUCOSE: 240 mg/dL — AB (ref 65–99)
Potassium: 3.6 mmol/L (ref 3.5–5.1)
SODIUM: 134 mmol/L — AB (ref 135–145)
Total Bilirubin: 0.5 mg/dL (ref 0.3–1.2)
Total Protein: 5.4 g/dL — ABNORMAL LOW (ref 6.5–8.1)

## 2015-04-30 LAB — CBC
HCT: 36.8 % (ref 36.0–46.0)
HEMOGLOBIN: 12.1 g/dL (ref 12.0–15.0)
MCH: 29.7 pg (ref 26.0–34.0)
MCHC: 32.9 g/dL (ref 30.0–36.0)
MCV: 90.2 fL (ref 78.0–100.0)
PLATELETS: 141 10*3/uL — AB (ref 150–400)
RBC: 4.08 MIL/uL (ref 3.87–5.11)
RDW: 13.5 % (ref 11.5–15.5)
WBC: 7.4 10*3/uL (ref 4.0–10.5)

## 2015-04-30 LAB — GLUCOSE, CAPILLARY
GLUCOSE-CAPILLARY: 272 mg/dL — AB (ref 65–99)
GLUCOSE-CAPILLARY: 222 mg/dL — AB (ref 65–99)
Glucose-Capillary: 209 mg/dL — ABNORMAL HIGH (ref 65–99)

## 2015-04-30 MED ORDER — GLIPIZIDE 5 MG PO TABS: 5.0000 mg | ORAL_TABLET | Freq: Two times a day (BID) | ORAL | Status: DC

## 2015-04-30 MED ORDER — CIPROFLOXACIN HCL 500 MG PO TABS: 500.0000 mg | ORAL_TABLET | Freq: Two times a day (BID) | ORAL | Status: DC

## 2015-04-30 MED ORDER — GLIPIZIDE 5 MG PO TABS: 5.0000 mg | ORAL_TABLET | Freq: Every day | ORAL | Status: DC

## 2015-04-30 MED ORDER — METRONIDAZOLE 500 MG PO TABS: 500.0000 mg | ORAL_TABLET | Freq: Three times a day (TID) | ORAL | Status: DC

## 2015-04-30 NOTE — Discharge Summary (Signed)
Physician Discharge Summary  [name] MRN: [id number] DOB/AGE: 03/01/1961 54 y.o.  PCP: Shamleffer, Ibethal JARALLA, MD   Admit date: 04/28/2015 Discharge date: 04/30/2015  Discharge Diagnoses:     Active Problems:   Diverticulitis  hypertension Diabetes mellitus Gastroesophageal reflux disease Rheumatoid arthritis   Follow-up recommendations Follow-up with PCP in 3-5 days , including all  additional recommended appointments as below Follow-up CBC, CMP in 3-5 days      Medication List    STOP taking these medications        aspirin 81 MG tablet     lisinopril 5 MG tablet  Commonly known as:  PRINIVIL,ZESTRIL      TAKE these medications        albuterol 108 (90 BASE) MCG/ACT inhaler  Commonly known as:  PROVENTIL HFA;VENTOLIN HFA  Inhale 1 puff into the lungs every 6 (six) hours as needed for wheezing or shortness of breath.     ciprofloxacin 500 MG tablet  Commonly known as:  CIPRO  Take 1 tablet (500 mg total) by mouth 2 (two) times daily.     fluticasone-salmeterol 115-21 MCG/ACT inhaler  Commonly known as:  ADVAIR HFA  Inhale 1 puff into the lungs daily as needed (shortness).     HYDROcodone-acetaminophen 10-325 MG tablet  Commonly known as:  NORCO  Take 1 tablet by mouth 3 (three) times daily.     insulin lispro 100 UNIT/ML injection  Commonly known as:  HUMALOG  Inject into the skin every morning. Insulin pump-base rate-     LIVALO 4 MG Tabs  Generic drug:  Pitavastatin Calcium  Take 4 mg by mouth daily.     methocarbamol 500 MG tablet  Commonly known as:  ROBAXIN  Take 1 tablet (500 mg total) by mouth 2 (two) times daily as needed for muscle spasms.     metroNIDAZOLE 500 MG tablet  Commonly known as:  FLAGYL  Take 1 tablet (500 mg total) by mouth 3 (three) times daily.     tiZANidine 4 MG tablet  Commonly known as:  ZANAFLEX  Take 4 mg by mouth daily as needed for muscle spasms.         Discharge Condition:  Stable   Discharge Instructions       Discharge Instructions    Diet - low sodium heart healthy    Complete by:  As directed   Soft Diet     Increase activity slowly    Complete by:  As directed                Disposition: 01-Home or Self Care   Consults: Gastroenterology   Significant Diagnostic Studies:  Ct Abdomen Pelvis W Contrast  04/28/2015  CLINICAL DATA:  Lower abdominal pain x2 days, blood in stool EXAM: CT ABDOMEN AND PELVIS WITH CONTRAST TECHNIQUE: Multidetector CT imaging of the abdomen and pelvis was performed using the standard protocol following bolus administration of intravenous contrast. CONTRAST:  100mL OMNIPAQUE IOHEXOL 300 MG/ML  SOLN COMPARISON:  02/17/2014 FINDINGS: Lower chest: Mild subpleural reticulation/paraseptal emphysematous changes at the lung bases. Hepatobiliary: Liver is within normal limits. Gallbladder is unremarkable. No intrahepatic or extrahepatic ductal dilatation. Pancreas: Within normal limits. Spleen: Within normal limits. Adrenals/Urinary Tract: Adrenal glands are within normal limits. Kidneys are within normal limits.  No hydronephrosis. Bladder is within normal limits. Stomach/Bowel: Stomach is within normal limits. No evidence of bowel obstruction. Prior appendectomy. Sigmoid diverticulosis. Mild wall thickening/inflammatory changes involving the descending colon (series 2/image 60). Additional wall   thickening involving the sigmoid colon (series 2/image 72), some of which may reflect chronic mucosal hypertrophy. Overall, this appearance suggests descending colonic/sigmoid diverticulitis. No drainable fluid collection/abscess.  No free air. Vascular/Lymphatic: Atherosclerotic calcifications of the abdominal aorta and branch vessels. No suspicious abdominopelvic lymphadenopathy. Reproductive: Uterus is within normal limits. Bilateral ovaries are within normal limits. Other: No abdominopelvic ascites. Musculoskeletal: Degenerative changes of  the visualized thoracolumbar spine. IMPRESSION: Colonic diverticulosis with suspected descending colonic/sigmoid diverticulitis. No drainable fluid collection/abscess. No free air to suggest macroscopic perforation. Given focal thickening along the descending colon, follow-up colonoscopy is suggested. Electronically Signed   By: Sriyesh  Krishnan M.D.   On: 04/28/2015 21:07       Filed Weights   04/28/15 2351 04/29/15 0637  Weight: 53.1 kg (117 lb 1 oz) 53.1 kg (117 lb 1 oz)     Microbiology: No results found for this or any previous visit (from the past 240 hour(s)).     Blood Culture    Component Value Date/Time   SDES WOUND THIGH LEFT 02/17/2014 1334   SPECREQUEST NONE 02/17/2014 1334   CULT  02/17/2014 1334    NO GROWTH 2 DAYS Performed at [facility]   REPTSTATUS 02/20/2014 FINAL 02/17/2014 1334      Labs: Results for orders placed or performed during the hospital encounter of 04/28/15 (from the past 48 hour(s))  POC occult blood, ED     Status: Abnormal   Collection Time: 04/28/15  6:23 PM  Result Value Ref Range   Fecal Occult Bld POSITIVE (A) NEGATIVE  Comprehensive metabolic panel     Status: Abnormal   Collection Time: 04/28/15  6:27 PM  Result Value Ref Range   Sodium 133 (L) 135 - 145 mmol/L   Potassium 4.0 3.5 - 5.1 mmol/L   Chloride 98 (L) 101 - 111 mmol/L   CO2 22 22 - 32 mmol/L   Glucose, Bld 433 (H) 65 - 99 mg/dL   BUN 13 6 - 20 mg/dL   Creatinine, Ser 0.92 0.44 - 1.00 mg/dL   Calcium 10.0 8.9 - 10.3 mg/dL   Total Protein 6.5 6.5 - 8.1 g/dL   Albumin 3.7 3.5 - 5.0 g/dL   AST 18 15 - 41 U/L   ALT 16 14 - 54 U/L   Alkaline Phosphatase 90 38 - 126 U/L   Total Bilirubin 0.5 0.3 - 1.2 mg/dL   GFR calc non Af Amer >60 >60 mL/min   GFR calc Af Amer >60 >60 mL/min    Comment: (NOTE) The eGFR has been calculated using the CKD EPI equation. This calculation has not been validated in all clinical situations. eGFR's persistently <60 mL/min  signify possible Chronic Kidney Disease.    Anion gap 13 5 - 15  CBC     Status: Abnormal   Collection Time: 04/28/15  6:27 PM  Result Value Ref Range   WBC 10.6 (H) 4.0 - 10.5 K/uL   RBC 4.70 3.87 - 5.11 MIL/uL   Hemoglobin 14.2 12.0 - 15.0 g/dL   HCT 42.1 36.0 - 46.0 %   MCV 89.6 78.0 - 100.0 fL   MCH 30.2 26.0 - 34.0 pg   MCHC 33.7 30.0 - 36.0 g/dL   RDW 13.6 11.5 - 15.5 %   Platelets 177 150 - 400 K/uL  Type and screen [facility]     Status: None   Collection Time: 04/28/15  6:27 PM  Result Value Ref Range   ABO/RH(D) O POS      Antibody Screen NEG    Sample Expiration 05/01/2015   ABO/Rh     Status: None   Collection Time: 04/28/15  6:27 PM  Result Value Ref Range   ABO/RH(D) O POS   Urinalysis, Routine w reflex microscopic (not at [facility])     Status: Abnormal   Collection Time: 04/28/15  8:12 PM  Result Value Ref Range   Color, Urine YELLOW YELLOW   APPearance CLEAR CLEAR   Specific Gravity, Urine 1.023 1.005 - 1.030   pH 5.0 5.0 - 8.0   Glucose, UA >1000 (A) NEGATIVE mg/dL   Hgb urine dipstick NEGATIVE NEGATIVE   Bilirubin Urine NEGATIVE NEGATIVE   Ketones, ur NEGATIVE NEGATIVE mg/dL   Protein, ur NEGATIVE NEGATIVE mg/dL   Urobilinogen, UA 0.2 0.0 - 1.0 mg/dL   Nitrite NEGATIVE NEGATIVE   Leukocytes, UA NEGATIVE NEGATIVE  Urine microscopic-add on     Status: None   Collection Time: 04/28/15  8:12 PM  Result Value Ref Range   Squamous Epithelial / LPF RARE RARE   WBC, UA 0-2 <3 WBC/hpf   RBC / HPF 0-2 <3 RBC/hpf   Bacteria, UA RARE RARE   Urine-Other RARE YEAST   CBG monitoring, ED     Status: Abnormal   Collection Time: 04/28/15  9:40 PM  Result Value Ref Range   Glucose-Capillary 251 (H) 65 - 99 mg/dL  Glucose, capillary     Status: Abnormal   Collection Time: 04/28/15 11:57 PM  Result Value Ref Range   Glucose-Capillary 201 (H) 65 - 99 mg/dL  Comprehensive metabolic panel     Status: Abnormal   Collection Time: 04/29/15  4:50 AM   Result Value Ref Range   Sodium 133 (L) 135 - 145 mmol/L   Potassium 3.6 3.5 - 5.1 mmol/L   Chloride 104 101 - 111 mmol/L   CO2 24 22 - 32 mmol/L   Glucose, Bld 205 (H) 65 - 99 mg/dL   BUN 8 6 - 20 mg/dL   Creatinine, Ser 0.95 0.44 - 1.00 mg/dL   Calcium 8.3 (L) 8.9 - 10.3 mg/dL   Total Protein 5.8 (L) 6.5 - 8.1 g/dL   Albumin 3.1 (L) 3.5 - 5.0 g/dL   AST 14 (L) 15 - 41 U/L   ALT 14 14 - 54 U/L   Alkaline Phosphatase 60 38 - 126 U/L   Total Bilirubin 0.4 0.3 - 1.2 mg/dL   GFR calc non Af Amer >60 >60 mL/min   GFR calc Af Amer >60 >60 mL/min    Comment: (NOTE) The eGFR has been calculated using the CKD EPI equation. This calculation has not been validated in all clinical situations. eGFR's persistently <60 mL/min signify possible Chronic Kidney Disease.    Anion gap 5 5 - 15  CBC WITH DIFFERENTIAL     Status: Abnormal   Collection Time: 04/29/15  4:50 AM  Result Value Ref Range   WBC 9.9 4.0 - 10.5 K/uL   RBC 3.98 3.87 - 5.11 MIL/uL   Hemoglobin 12.1 12.0 - 15.0 g/dL   HCT 36.0 36.0 - 46.0 %   MCV 90.5 78.0 - 100.0 fL   MCH 30.4 26.0 - 34.0 pg   MCHC 33.6 30.0 - 36.0 g/dL   RDW 13.7 11.5 - 15.5 %   Platelets 151 150 - 400 K/uL   Neutrophils Relative % 44 %   Neutro Abs 4.4 1.7 - 7.7 K/uL   Lymphocytes Relative 45 %   Lymphs Abs 4.4 (H) 0.7 -   4.0 K/uL   Monocytes Relative 6 %   Monocytes Absolute 0.6 0.1 - 1.0 K/uL   Eosinophils Relative 5 %   Eosinophils Absolute 0.5 0.0 - 0.7 K/uL   Basophils Relative 0 %   Basophils Absolute 0.0 0.0 - 0.1 K/uL  Glucose, capillary     Status: Abnormal   Collection Time: 04/29/15  6:07 AM  Result Value Ref Range   Glucose-Capillary 240 (H) 65 - 99 mg/dL  Glucose, capillary     Status: Abnormal   Collection Time: 04/29/15 11:56 AM  Result Value Ref Range   Glucose-Capillary 224 (H) 65 - 99 mg/dL  Glucose, capillary     Status: Abnormal   Collection Time: 04/29/15  4:19 PM  Result Value Ref Range   Glucose-Capillary 291 (H) 65  - 99 mg/dL  Glucose, capillary     Status: Abnormal   Collection Time: 04/30/15 12:05 AM  Result Value Ref Range   Glucose-Capillary 209 (H) 65 - 99 mg/dL  CBC     Status: Abnormal   Collection Time: 04/30/15  5:47 AM  Result Value Ref Range   WBC 7.4 4.0 - 10.5 K/uL   RBC 4.08 3.87 - 5.11 MIL/uL   Hemoglobin 12.1 12.0 - 15.0 g/dL   HCT 36.8 36.0 - 46.0 %   MCV 90.2 78.0 - 100.0 fL   MCH 29.7 26.0 - 34.0 pg   MCHC 32.9 30.0 - 36.0 g/dL   RDW 13.5 11.5 - 15.5 %   Platelets 141 (L) 150 - 400 K/uL  Comprehensive metabolic panel     Status: Abnormal   Collection Time: 04/30/15  5:47 AM  Result Value Ref Range   Sodium 134 (L) 135 - 145 mmol/L   Potassium 3.6 3.5 - 5.1 mmol/L   Chloride 104 101 - 111 mmol/L   CO2 23 22 - 32 mmol/L   Glucose, Bld 240 (H) 65 - 99 mg/dL   BUN <5 (L) 6 - 20 mg/dL   Creatinine, Ser 0.90 0.44 - 1.00 mg/dL   Calcium 8.5 (L) 8.9 - 10.3 mg/dL   Total Protein 5.4 (L) 6.5 - 8.1 g/dL   Albumin 3.1 (L) 3.5 - 5.0 g/dL   AST 15 15 - 41 U/L   ALT 12 (L) 14 - 54 U/L   Alkaline Phosphatase 55 38 - 126 U/L   Total Bilirubin 0.5 0.3 - 1.2 mg/dL   GFR calc non Af Amer >60 >60 mL/min   GFR calc Af Amer >60 >60 mL/min    Comment: (NOTE) The eGFR has been calculated using the CKD EPI equation. This calculation has not been validated in all clinical situations. eGFR's persistently <60 mL/min signify possible Chronic Kidney Disease.    Anion gap 7 5 - 15  Glucose, capillary     Status: Abnormal   Collection Time: 04/30/15  6:41 AM  Result Value Ref Range   Glucose-Capillary 272 (H) 65 - 99 mg/dL     Lipid Panel     Component Value Date/Time   CHOL 245* 03/14/2014 0117   TRIG 363* 03/14/2014 0117   HDL 33* 03/14/2014 0117   CHOLHDL 7.4 03/14/2014 0117   VLDL 73* 03/14/2014 0117   LDLCALC 139* 03/14/2014 0117     Lab Results  Component Value Date   HGBA1C 8.4* 03/14/2014     Lab Results  Component Value Date   LDLCALC 139* 03/14/2014    CREATININE 0.90 04/30/2015     HPI :54 y.o. female seen for a  consult due to rectal bleeding stating that she had the acute onset of LLQ pain and bright red blood per rectum 2 days ago. Pain is constant and sharp and mainly in LLQ but also having pain at umbilicus and in the suprapubic area. Denies diarrhea with the bleeding and reports her last BM was 3 days ago and was formed. Denies fevers or chills. CT scan shows diverticulitis of the distal descending colon. Denies previous history of diverticulitis or diverticular bleeding. Colonoscopy (01/2014) showed sigmoid diverticulosis and internal hemorrhoids. CT in 01/2014 that was done for chronic lower abdominal pain did not show any diverticulitis. No bleeding since admit. Wants to eat.  HOSPITAL COURSE:   Acute diverticulitis Initially started on treatment with IV fluids, Flagyl/ciprofloxacin, tolerating heart healthy diet. Continue ciprofloxacin/Flagyl 10 days Patient states that her rectal bleeding has stopped, no further episodes of bleeding overnight. Aspirin discontinued   Diabetes mellitus type 2, hemoglobin A1c pending, patient will be started on glipizide for diabetes  Hypertension hold lisinopril, resume when diverticulitis resolves.  Fibromyalgia continue Zanaflex  Rheumatoid arthritis-continue Zanaflex  COPD continue when necessary nebulizer treatments, Advair   Discharge Exam:    Blood pressure 115/54, pulse 60, temperature 98.2 F (36.8 C), temperature source Oral, resp. rate 18, height 4' 11" (1.499 m), weight 53.1 kg (117 lb 1 oz), SpO2 98 %.   General: Elderly, Alert, Well-developed, well-nourished, pleasant and cooperative in NAD Head: atraumatic Eyes: anicteric ENT: oropharynx clear Neck: supple, nontender Lungs: Clear throughout to auscultation. No wheezes, crackles, or rhonchi. No acute distress. Heart: Regular rate and rhythm; no murmurs, clicks, rubs, or gallops. Abdomen: LLQ, periumbilical and  suprapubic tenderness with guarding, soft, nondistended, +BS  Rectal: Deferred Ext: pedal pulses intact, no edema Skin: no rash, no jaundice   Follow-up Information    Follow up with Shamleffer, Herschell Dimes, MD. Schedule an appointment as soon as possible for a visit in 3 days.   Specialty:  Internal Medicine   Contact information:   [address] [place] [id number] [phone]       Signed: Reyne Dumas 04/30/2015, 12:08 PM        Time spent >45 mins

## 2015-04-30 NOTE — Progress Notes (Signed)
Patient discharge teaching given, including activity, diet, follow-up appoints, and medications. Patient verbalized understanding of all discharge instructions. IV access was d/c'd. Vitals are stable. Skin is intact except as charted in most recent assessments. Pt to be escorted out by NT, to be driven home by family. 

## 2015-05-01 LAB — HEMOGLOBIN A1C
Hgb A1c MFr Bld: 8.4 % — ABNORMAL HIGH (ref 4.8–5.6)
Mean Plasma Glucose: 194 mg/dL

## 2015-05-02 DIAGNOSIS — K5792 Diverticulitis of intestine, part unspecified, without perforation or abscess without bleeding: Secondary | ICD-10-CM | POA: Diagnosis not present

## 2015-05-02 DIAGNOSIS — Z09 Encounter for follow-up examination after completed treatment for conditions other than malignant neoplasm: Secondary | ICD-10-CM | POA: Diagnosis not present

## 2015-05-02 DIAGNOSIS — I700 Atherosclerosis of aorta: Secondary | ICD-10-CM | POA: Diagnosis not present

## 2015-05-02 DIAGNOSIS — F411 Generalized anxiety disorder: Secondary | ICD-10-CM | POA: Diagnosis not present

## 2015-05-12 DIAGNOSIS — J449 Chronic obstructive pulmonary disease, unspecified: Secondary | ICD-10-CM | POA: Diagnosis not present

## 2015-05-12 DIAGNOSIS — K5792 Diverticulitis of intestine, part unspecified, without perforation or abscess without bleeding: Secondary | ICD-10-CM | POA: Diagnosis not present

## 2015-05-12 DIAGNOSIS — G894 Chronic pain syndrome: Secondary | ICD-10-CM | POA: Diagnosis not present

## 2015-05-12 DIAGNOSIS — Z0001 Encounter for general adult medical examination with abnormal findings: Secondary | ICD-10-CM | POA: Diagnosis not present

## 2015-05-12 DIAGNOSIS — Z1389 Encounter for screening for other disorder: Secondary | ICD-10-CM | POA: Diagnosis not present

## 2015-05-12 DIAGNOSIS — E1065 Type 1 diabetes mellitus with hyperglycemia: Secondary | ICD-10-CM | POA: Diagnosis not present

## 2015-05-12 DIAGNOSIS — I10 Essential (primary) hypertension: Secondary | ICD-10-CM | POA: Diagnosis not present

## 2015-05-12 DIAGNOSIS — M47817 Spondylosis without myelopathy or radiculopathy, lumbosacral region: Secondary | ICD-10-CM | POA: Diagnosis not present

## 2015-05-12 DIAGNOSIS — F418 Other specified anxiety disorders: Secondary | ICD-10-CM | POA: Diagnosis not present

## 2015-05-12 DIAGNOSIS — Z794 Long term (current) use of insulin: Secondary | ICD-10-CM | POA: Diagnosis not present

## 2015-05-12 DIAGNOSIS — F17210 Nicotine dependence, cigarettes, uncomplicated: Secondary | ICD-10-CM | POA: Diagnosis not present

## 2015-05-12 DIAGNOSIS — M8588 Other specified disorders of bone density and structure, other site: Secondary | ICD-10-CM | POA: Diagnosis not present

## 2015-05-12 DIAGNOSIS — E785 Hyperlipidemia, unspecified: Secondary | ICD-10-CM | POA: Diagnosis not present

## 2015-06-12 DIAGNOSIS — G894 Chronic pain syndrome: Secondary | ICD-10-CM | POA: Diagnosis not present

## 2015-06-13 DIAGNOSIS — S60211A Contusion of right wrist, initial encounter: Secondary | ICD-10-CM | POA: Diagnosis not present

## 2015-06-13 DIAGNOSIS — S20211A Contusion of right front wall of thorax, initial encounter: Secondary | ICD-10-CM | POA: Diagnosis not present

## 2015-06-13 DIAGNOSIS — W19XXXA Unspecified fall, initial encounter: Secondary | ICD-10-CM | POA: Diagnosis not present

## 2015-06-15 ENCOUNTER — Ambulatory Visit: Payer: Medicare Other | Admitting: *Deleted

## 2015-06-15 DIAGNOSIS — S62112A Displaced fracture of triquetrum [cuneiform] bone, left wrist, initial encounter for closed fracture: Secondary | ICD-10-CM | POA: Diagnosis not present

## 2015-07-11 DIAGNOSIS — G894 Chronic pain syndrome: Secondary | ICD-10-CM | POA: Diagnosis not present

## 2015-07-11 DIAGNOSIS — M47817 Spondylosis without myelopathy or radiculopathy, lumbosacral region: Secondary | ICD-10-CM | POA: Diagnosis not present

## 2015-07-24 IMAGING — CR DG CHEST 2V
2 series · 2 of 2 positions shown · non-contrast
Comparison: 04/23/2012

CLINICAL DATA: Cough.  Chest pain.  Shortness of breath.  Left-
sided pain.

CHEST - 2 VIEW

[w chest pa]
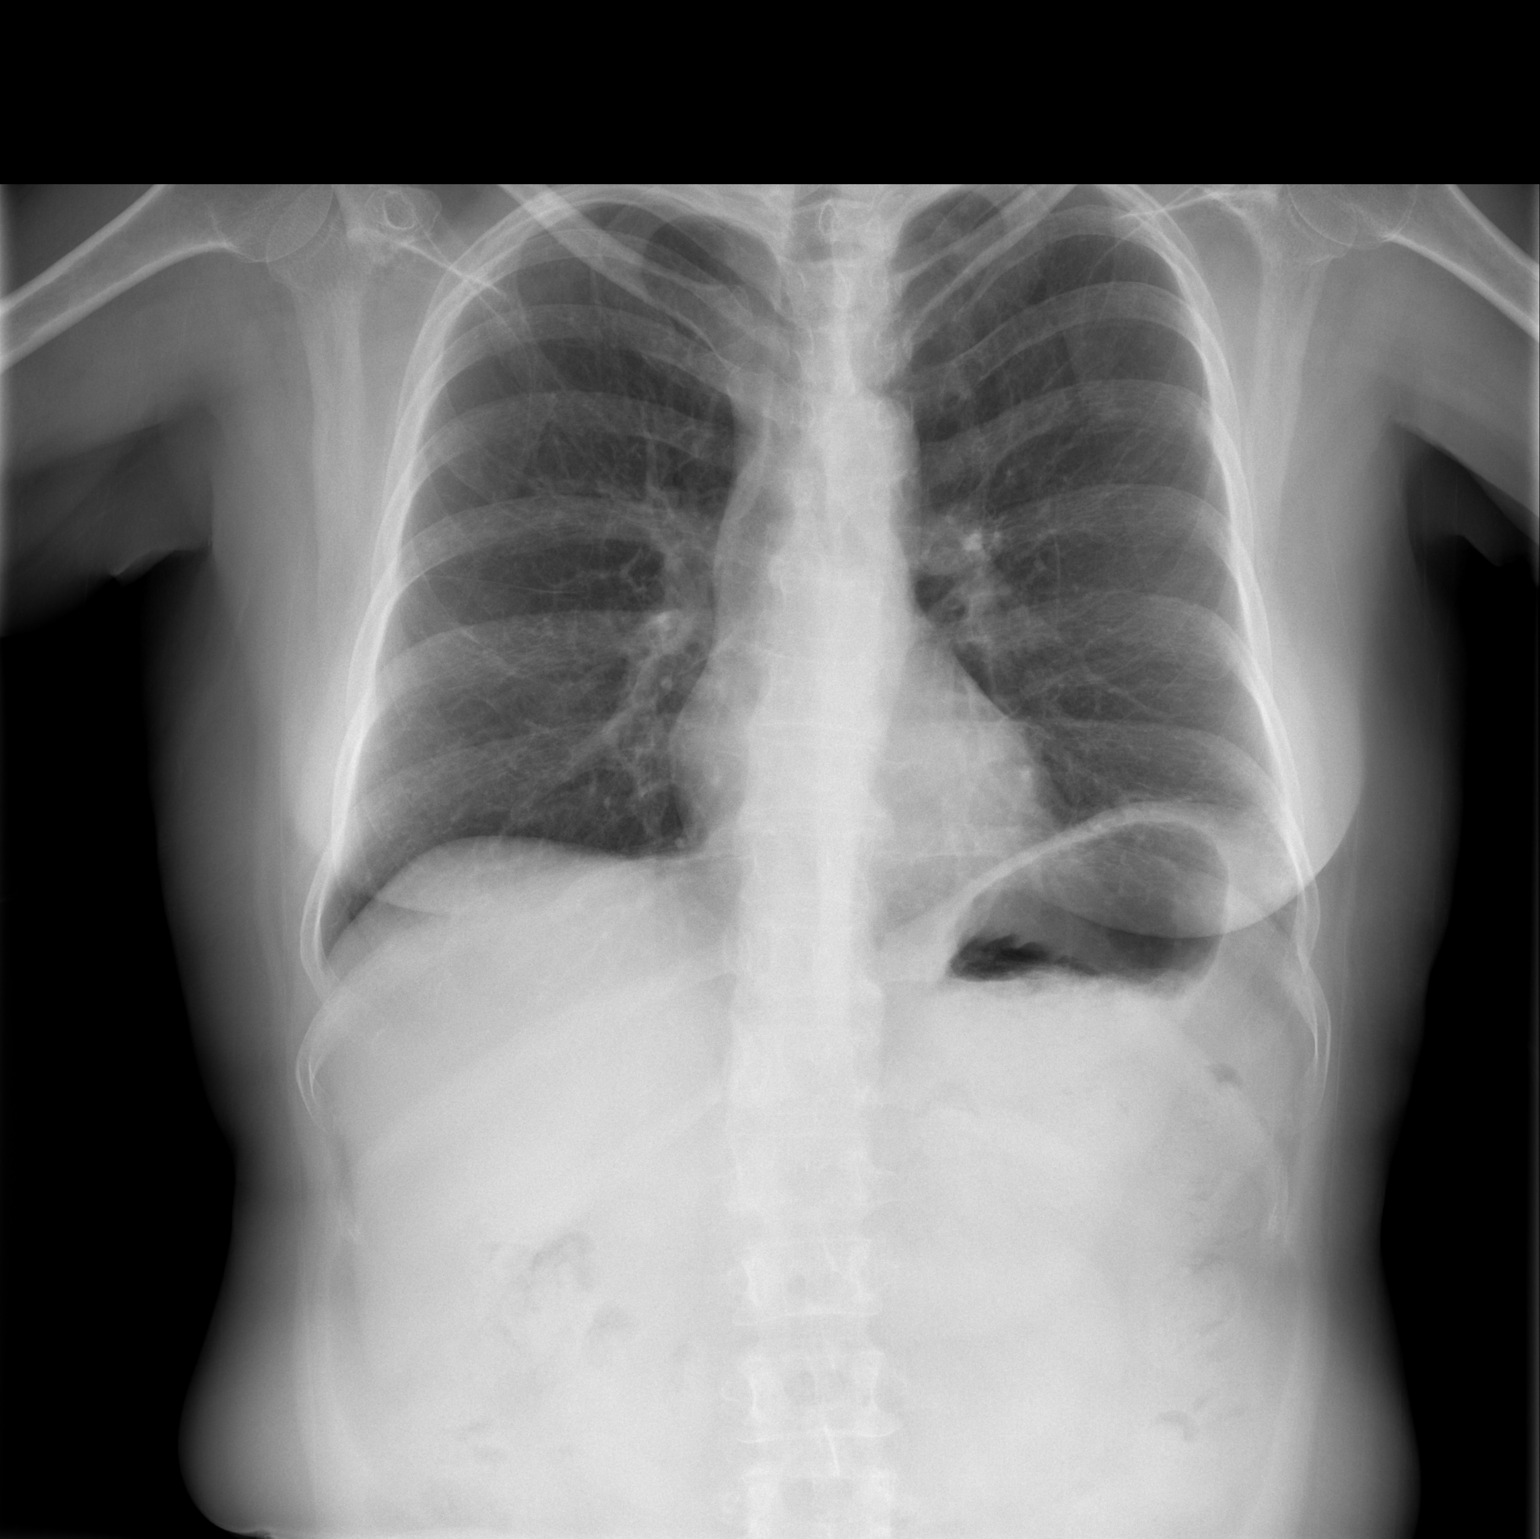

[w chest lat]
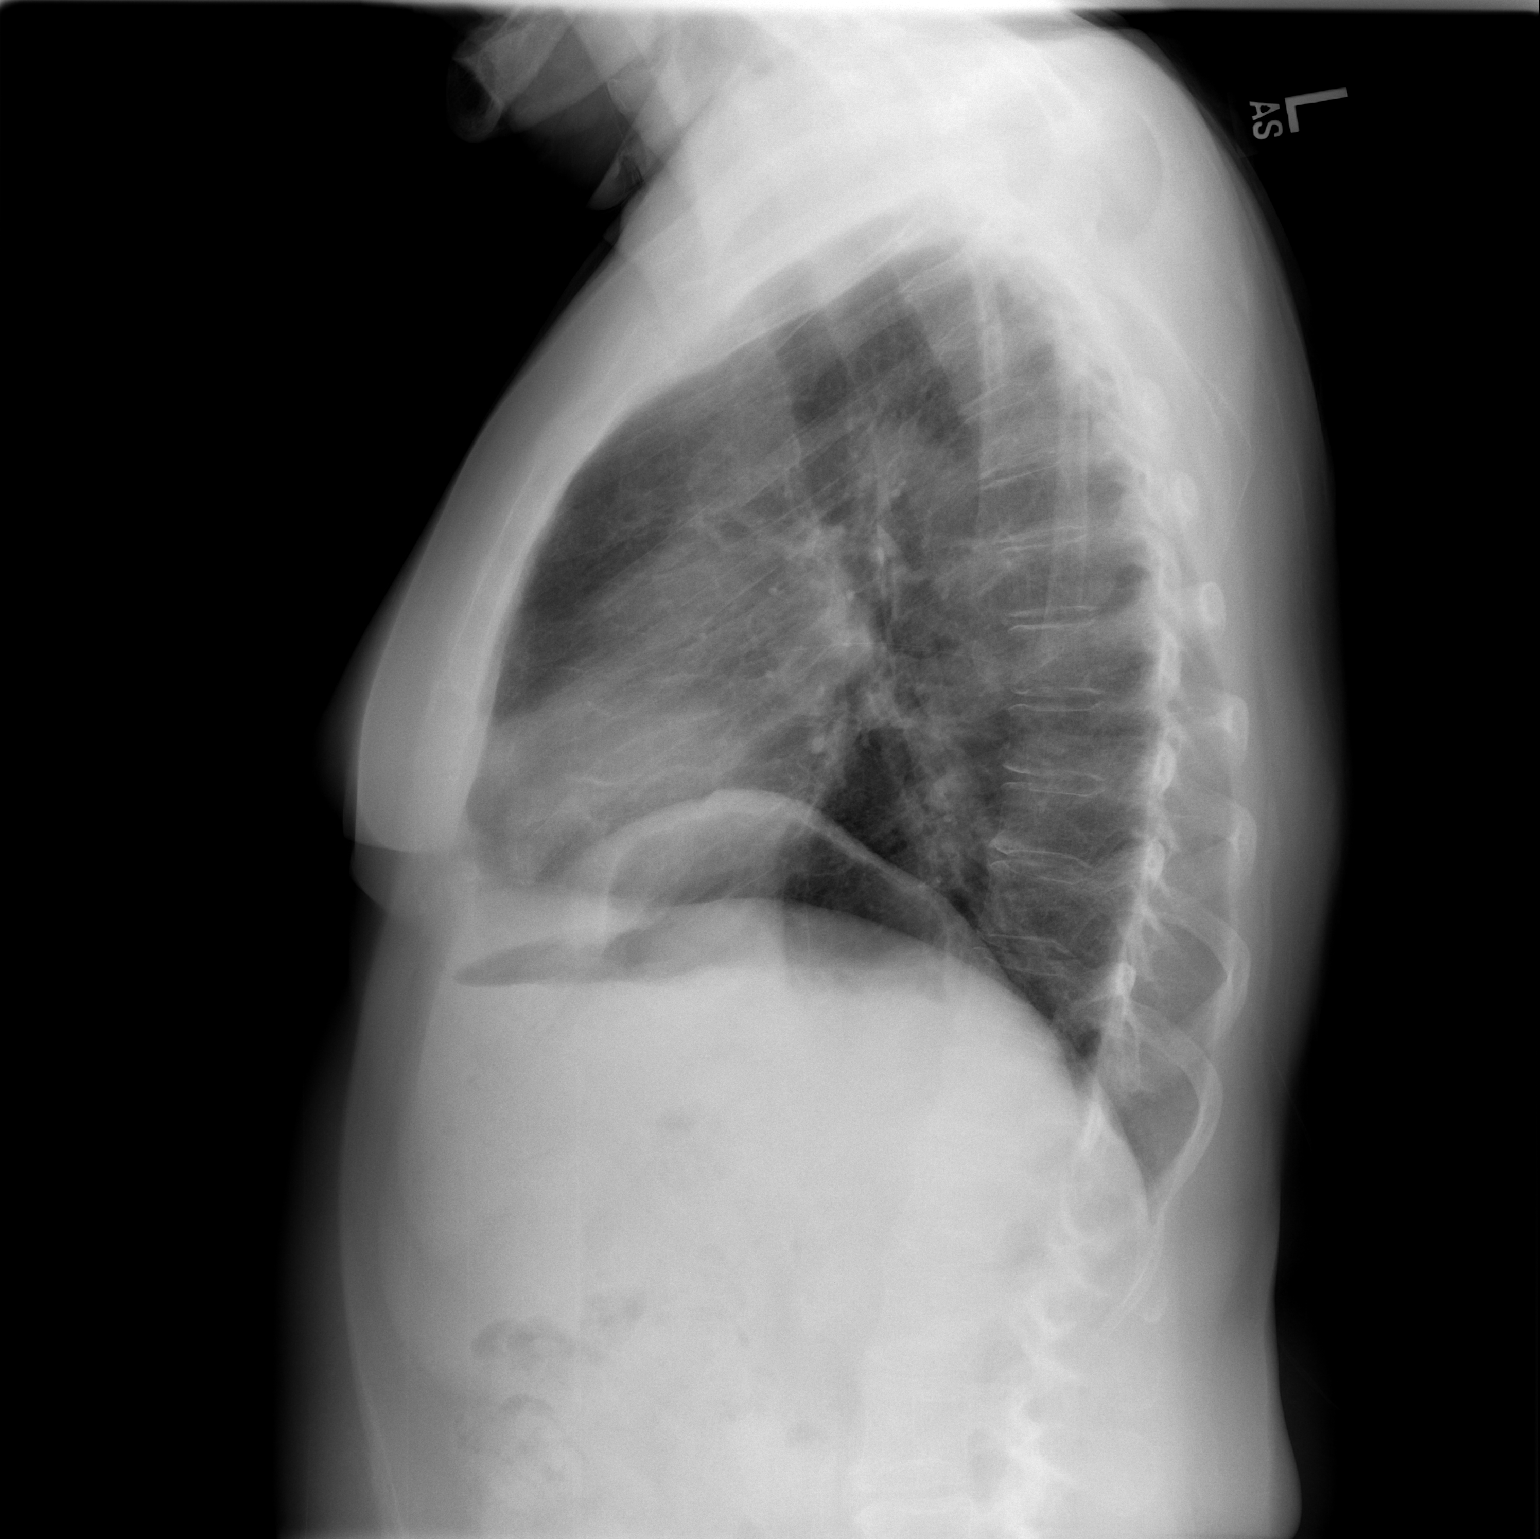

[2 of 2 positions shown; findings below may reference images not displayed]

FINDINGS: Left rotator cuff repair. Midline trachea.  Normal heart
size.  Small hiatal hernia. No pleural effusion or pneumothorax.
Clear lungs.
IMPRESSION: No acute cardiopulmonary disease.

## 2015-07-28 DIAGNOSIS — Z23 Encounter for immunization: Secondary | ICD-10-CM | POA: Diagnosis not present

## 2015-07-28 DIAGNOSIS — E1065 Type 1 diabetes mellitus with hyperglycemia: Secondary | ICD-10-CM | POA: Diagnosis not present

## 2015-07-28 DIAGNOSIS — Z794 Long term (current) use of insulin: Secondary | ICD-10-CM | POA: Diagnosis not present

## 2015-07-28 DIAGNOSIS — E1042 Type 1 diabetes mellitus with diabetic polyneuropathy: Secondary | ICD-10-CM | POA: Diagnosis not present

## 2015-08-03 ENCOUNTER — Encounter: Payer: Medicare Other | Attending: Internal Medicine | Admitting: *Deleted

## 2015-08-03 DIAGNOSIS — E1340 Other specified diabetes mellitus with diabetic neuropathy, unspecified: Secondary | ICD-10-CM | POA: Insufficient documentation

## 2015-08-03 DIAGNOSIS — E1065 Type 1 diabetes mellitus with hyperglycemia: Secondary | ICD-10-CM

## 2015-08-03 DIAGNOSIS — R1030 Lower abdominal pain, unspecified: Secondary | ICD-10-CM | POA: Diagnosis not present

## 2015-08-03 DIAGNOSIS — IMO0002 Reserved for concepts with insufficient information to code with codable children: Secondary | ICD-10-CM

## 2015-08-03 DIAGNOSIS — Z792 Long term (current) use of antibiotics: Secondary | ICD-10-CM | POA: Insufficient documentation

## 2015-08-03 DIAGNOSIS — E108 Type 1 diabetes mellitus with unspecified complications: Secondary | ICD-10-CM

## 2015-08-03 NOTE — Patient Instructions (Signed)
Plan: We have looked at your [facility] Reports. We discussed Sick Day Rules today, you now understand that you should not remove your pump when you are sick. Consider using Temp Basal with guidelines from Dr. Buddy Duty or me as to how to set it. I have given you the DM 1 Support Group Flyer and you have said you are able to take the bus to come to these Support Group meetings. Please do!low We have discussed current options that may allow you to have access to CGM which would provide you with some peace of mind regarding your unawareness of low BG especially during the night . We will follow up as you would prefer the Medtronic CGM since you have a Medtronic pump. Marland Kitchen

## 2015-08-08 DIAGNOSIS — Z79899 Other long term (current) drug therapy: Secondary | ICD-10-CM | POA: Diagnosis not present

## 2015-08-08 DIAGNOSIS — G894 Chronic pain syndrome: Secondary | ICD-10-CM | POA: Diagnosis not present

## 2015-08-08 DIAGNOSIS — Z79891 Long term (current) use of opiate analgesic: Secondary | ICD-10-CM | POA: Diagnosis not present

## 2015-08-08 DIAGNOSIS — M47892 Other spondylosis, cervical region: Secondary | ICD-10-CM | POA: Diagnosis not present

## 2015-08-09 ENCOUNTER — Ambulatory Visit
Admission: RE | Admit: 2015-08-09 | Discharge: 2015-08-09 | Disposition: A | Discharge status: Home / Self Care | Payer: Medicare Other | Source: Ambulatory Visit | Attending: Gastroenterology | Admitting: Gastroenterology

## 2015-08-09 ENCOUNTER — Other Ambulatory Visit: Payer: Self-pay | Admitting: Gastroenterology

## 2015-08-09 ENCOUNTER — Telehealth: Payer: Self-pay | Admitting: *Deleted

## 2015-08-09 DIAGNOSIS — R1030 Lower abdominal pain, unspecified: Secondary | ICD-10-CM

## 2015-08-09 DIAGNOSIS — R109 Unspecified abdominal pain: Secondary | ICD-10-CM | POA: Diagnosis not present

## 2015-08-09 DIAGNOSIS — K5900 Constipation, unspecified: Secondary | ICD-10-CM | POA: Diagnosis not present

## 2015-08-09 NOTE — Progress Notes (Signed)
Follow Up Visit on 08/03/15  Start time: 1030      End time: 1100  Patient states she continues to feel overwhelmed and anxious with fear of low BG especially during the night. She states her cat will wake her up in the night when she is low by licking her face! She is aware of the possibility of Medicare covering Dexcom CGM which could alert her to her hypoglycemia, however she would prefer to use a Medtronic CGM once it is covered since she has a Medtronic pump. She states her clip has broken and wants to know how to replace it.  Reviewed blood glucose logs on 08/03/15 via:  [facility] found the following:          Hypoglycemia Hyperglycemia Comments  Overnight Period:      Pre-Meal:    Breakfast      Lunch  YES but rare    Supper     Post-Meal: Breakfast      Lunch      Supper     Bedtime:       Comments: Average BG since last visit has increased to 213 +/- 96.  Due to her feelings of hopelessness she is testing less often and continues to be concerned about her hypoglycemia unawareness. I encouraged her to consider the Dexcom CGM if we can confirm coverage. She again prefers to wait for the Medtronic product, feeling it should be covered shortly too. No changes made to pump settings today. I again informed her of the DM 1 Support Group and that there will be speakers from [place] at the next 2 meetings.   Pump Settings: Date: Current Date: 08/03/15  No changes made today   Basal Rate: Carb Ratio Sensitivity  Basal Rate: Carb Ratio Sensitivity   MN: 1.40 2.5 12 MN: 1.40 2.5 12  3  A: 1.00   3 A 1.00    10 A 1.20   10 A 1.20                                         Plan: You have the Card for Therapeutic Alternatives from a previous visti if you feel you need to reach out to someone to vent or if you feel you want to give up. You also have my cell # to call if you need me anytime. We have looked at your [facility] Pro Reports, we are not making any changes to your pump  settings today. I have given you the DM 1 Support Group Flyer and you have said you are able to take the bus to come to these Support Group meetings. Please do! We reviewed your options that may allow you to have access to CGM which would provide you with some peace of mind regarding your unawareness of low BG until you are dangerously low. Please follow up with Dr. Buddy Duty or your Primary Care MD regarding your anxiety and treatment options for that.  Follow up:3 months

## 2015-08-09 NOTE — Telephone Encounter (Signed)
Called patient to inform her that she can call Medtronic to get free clip replacement she needs. She reported 2 low BG's over night in the 30's. I asked her to set alarm and get some mid sleep BG data so we can adjust pump settings for her.

## 2015-08-29 DIAGNOSIS — R1030 Lower abdominal pain, unspecified: Secondary | ICD-10-CM | POA: Diagnosis not present

## 2015-09-07 ENCOUNTER — Other Ambulatory Visit: Payer: Self-pay | Admitting: Gastroenterology

## 2015-09-07 DIAGNOSIS — M47817 Spondylosis without myelopathy or radiculopathy, lumbosacral region: Secondary | ICD-10-CM | POA: Diagnosis not present

## 2015-09-07 DIAGNOSIS — K5732 Diverticulitis of large intestine without perforation or abscess without bleeding: Secondary | ICD-10-CM

## 2015-09-07 DIAGNOSIS — G894 Chronic pain syndrome: Secondary | ICD-10-CM | POA: Diagnosis not present

## 2015-09-07 DIAGNOSIS — R1030 Lower abdominal pain, unspecified: Secondary | ICD-10-CM

## 2015-09-07 DIAGNOSIS — Z79899 Other long term (current) drug therapy: Secondary | ICD-10-CM | POA: Diagnosis not present

## 2015-09-07 DIAGNOSIS — Z79891 Long term (current) use of opiate analgesic: Secondary | ICD-10-CM | POA: Diagnosis not present

## 2015-09-08 ENCOUNTER — Other Ambulatory Visit: Payer: Medicare Other

## 2015-09-14 ENCOUNTER — Ambulatory Visit
Admission: RE | Admit: 2015-09-14 | Discharge: 2015-09-14 | Disposition: A | Discharge status: Home / Self Care | Payer: Medicare Other | Source: Ambulatory Visit | Attending: Gastroenterology | Admitting: Gastroenterology

## 2015-09-14 DIAGNOSIS — K5790 Diverticulosis of intestine, part unspecified, without perforation or abscess without bleeding: Secondary | ICD-10-CM | POA: Diagnosis not present

## 2015-09-14 DIAGNOSIS — R1030 Lower abdominal pain, unspecified: Secondary | ICD-10-CM

## 2015-09-14 DIAGNOSIS — K5732 Diverticulitis of large intestine without perforation or abscess without bleeding: Secondary | ICD-10-CM

## 2015-09-14 MED ORDER — IOPAMIDOL (ISOVUE-300) INJECTION 61%
100.0000 mL | Freq: Once | INTRAVENOUS | Status: AC | PRN
  Administered 2015-09-14: 100 mL via INTRAVENOUS

## 2015-10-12 DIAGNOSIS — Z794 Long term (current) use of insulin: Secondary | ICD-10-CM | POA: Diagnosis not present

## 2015-10-12 DIAGNOSIS — E1042 Type 1 diabetes mellitus with diabetic polyneuropathy: Secondary | ICD-10-CM | POA: Diagnosis not present

## 2015-10-12 DIAGNOSIS — E1065 Type 1 diabetes mellitus with hyperglycemia: Secondary | ICD-10-CM | POA: Diagnosis not present

## 2015-11-09 ENCOUNTER — Encounter: Payer: Medicare Other | Attending: Internal Medicine | Admitting: *Deleted

## 2015-11-09 DIAGNOSIS — E1042 Type 1 diabetes mellitus with diabetic polyneuropathy: Secondary | ICD-10-CM | POA: Diagnosis not present

## 2015-11-09 DIAGNOSIS — E1065 Type 1 diabetes mellitus with hyperglycemia: Secondary | ICD-10-CM | POA: Diagnosis not present

## 2015-11-09 DIAGNOSIS — E108 Type 1 diabetes mellitus with unspecified complications: Secondary | ICD-10-CM

## 2015-11-09 DIAGNOSIS — Z792 Long term (current) use of antibiotics: Secondary | ICD-10-CM | POA: Diagnosis not present

## 2015-11-09 DIAGNOSIS — Z794 Long term (current) use of insulin: Secondary | ICD-10-CM | POA: Diagnosis not present

## 2015-11-09 DIAGNOSIS — E1340 Other specified diabetes mellitus with diabetic neuropathy, unspecified: Secondary | ICD-10-CM | POA: Diagnosis not present

## 2015-11-09 NOTE — Patient Instructions (Signed)
Plan: We have looked at your [facility] Pro Reports. We discussed your Sensitivity Factor that Dr. Buddy Duty increased today which will now give you 1 unit of insulin for every 10 mg/dl you are above your target Glad you've been able to attend more of the DM 1 / Pump Support Group Meetings The next Meeting is June 7th. I also explained how to give additional insulin for your bolus if you reach the Max of 25 units. (Press the B button to take you to the Bolus Wizard and then press again to enter the additional units you need) .

## 2015-11-10 ENCOUNTER — Encounter: Payer: Self-pay | Admitting: *Deleted

## 2015-11-10 NOTE — Progress Notes (Signed)
Diabetes and insulin pump follow up visit. Patient had appointment with Dr. Buddy Duty, this AM and she states he increased her Sensitivity Factor from 12 to 10 mg/dl, but she is not sure what that means. She also states he decreased her Basal Rate from MN to 10 AM to help prevent overnight hypoglycemia.  I uploaded her pump and we reviewed the CareLink Pro Reports together. I explained to her the increase of her Sensitivity Factor would give her more insulin for correcting her high BG's and bring her closer to her Target Range. I encouraged her to check her BG and to eat at least 3 times a day and to be sure and give a bolus for each as needed.

## 2015-12-19 DIAGNOSIS — N898 Other specified noninflammatory disorders of vagina: Secondary | ICD-10-CM | POA: Diagnosis not present

## 2015-12-19 DIAGNOSIS — R300 Dysuria: Secondary | ICD-10-CM | POA: Diagnosis not present

## 2016-01-10 DIAGNOSIS — E119 Type 2 diabetes mellitus without complications: Secondary | ICD-10-CM | POA: Diagnosis not present

## 2016-01-10 DIAGNOSIS — H04123 Dry eye syndrome of bilateral lacrimal glands: Secondary | ICD-10-CM | POA: Diagnosis not present

## 2016-01-10 DIAGNOSIS — H3589 Other specified retinal disorders: Secondary | ICD-10-CM | POA: Diagnosis not present

## 2016-01-10 DIAGNOSIS — Z961 Presence of intraocular lens: Secondary | ICD-10-CM | POA: Diagnosis not present

## 2016-01-16 DIAGNOSIS — Z794 Long term (current) use of insulin: Secondary | ICD-10-CM | POA: Diagnosis not present

## 2016-01-16 DIAGNOSIS — E1042 Type 1 diabetes mellitus with diabetic polyneuropathy: Secondary | ICD-10-CM | POA: Diagnosis not present

## 2016-01-16 DIAGNOSIS — E1065 Type 1 diabetes mellitus with hyperglycemia: Secondary | ICD-10-CM | POA: Diagnosis not present

## 2016-01-25 DIAGNOSIS — Z7984 Long term (current) use of oral hypoglycemic drugs: Secondary | ICD-10-CM | POA: Diagnosis not present

## 2016-01-25 DIAGNOSIS — E785 Hyperlipidemia, unspecified: Secondary | ICD-10-CM | POA: Diagnosis not present

## 2016-01-25 DIAGNOSIS — R002 Palpitations: Secondary | ICD-10-CM | POA: Diagnosis not present

## 2016-01-25 DIAGNOSIS — E1042 Type 1 diabetes mellitus with diabetic polyneuropathy: Secondary | ICD-10-CM | POA: Diagnosis not present

## 2016-01-25 DIAGNOSIS — F4322 Adjustment disorder with anxiety: Secondary | ICD-10-CM | POA: Diagnosis not present

## 2016-02-22 DIAGNOSIS — Z794 Long term (current) use of insulin: Secondary | ICD-10-CM | POA: Diagnosis not present

## 2016-02-22 DIAGNOSIS — F4322 Adjustment disorder with anxiety: Secondary | ICD-10-CM | POA: Diagnosis not present

## 2016-02-22 DIAGNOSIS — E1142 Type 2 diabetes mellitus with diabetic polyneuropathy: Secondary | ICD-10-CM | POA: Diagnosis not present

## 2016-03-25 DIAGNOSIS — Z23 Encounter for immunization: Secondary | ICD-10-CM | POA: Diagnosis not present

## 2016-03-26 DIAGNOSIS — Z23 Encounter for immunization: Secondary | ICD-10-CM | POA: Diagnosis not present

## 2016-03-27 DIAGNOSIS — E1142 Type 2 diabetes mellitus with diabetic polyneuropathy: Secondary | ICD-10-CM | POA: Diagnosis not present

## 2016-03-27 DIAGNOSIS — R300 Dysuria: Secondary | ICD-10-CM | POA: Diagnosis not present

## 2016-03-27 DIAGNOSIS — N7689 Other specified inflammation of vagina and vulva: Secondary | ICD-10-CM | POA: Diagnosis not present

## 2016-03-27 DIAGNOSIS — Z794 Long term (current) use of insulin: Secondary | ICD-10-CM | POA: Diagnosis not present

## 2016-05-27 DIAGNOSIS — I10 Essential (primary) hypertension: Secondary | ICD-10-CM | POA: Diagnosis not present

## 2016-05-27 DIAGNOSIS — E1165 Type 2 diabetes mellitus with hyperglycemia: Secondary | ICD-10-CM | POA: Diagnosis not present

## 2016-05-27 DIAGNOSIS — F411 Generalized anxiety disorder: Secondary | ICD-10-CM | POA: Diagnosis not present

## 2016-05-27 DIAGNOSIS — Z794 Long term (current) use of insulin: Secondary | ICD-10-CM | POA: Diagnosis not present

## 2016-05-27 DIAGNOSIS — M797 Fibromyalgia: Secondary | ICD-10-CM | POA: Diagnosis not present

## 2016-05-27 DIAGNOSIS — Z0000 Encounter for general adult medical examination without abnormal findings: Secondary | ICD-10-CM | POA: Diagnosis not present

## 2016-05-27 DIAGNOSIS — J410 Simple chronic bronchitis: Secondary | ICD-10-CM | POA: Diagnosis not present

## 2016-05-27 DIAGNOSIS — M8580 Other specified disorders of bone density and structure, unspecified site: Secondary | ICD-10-CM | POA: Diagnosis not present

## 2016-05-27 DIAGNOSIS — E1142 Type 2 diabetes mellitus with diabetic polyneuropathy: Secondary | ICD-10-CM | POA: Diagnosis not present

## 2016-05-27 DIAGNOSIS — I700 Atherosclerosis of aorta: Secondary | ICD-10-CM | POA: Diagnosis not present

## 2016-05-27 DIAGNOSIS — Z1231 Encounter for screening mammogram for malignant neoplasm of breast: Secondary | ICD-10-CM | POA: Diagnosis not present

## 2016-05-27 DIAGNOSIS — E785 Hyperlipidemia, unspecified: Secondary | ICD-10-CM | POA: Diagnosis not present

## 2016-05-28 DIAGNOSIS — E1065 Type 1 diabetes mellitus with hyperglycemia: Secondary | ICD-10-CM | POA: Diagnosis not present

## 2016-05-28 DIAGNOSIS — Z794 Long term (current) use of insulin: Secondary | ICD-10-CM | POA: Diagnosis not present

## 2016-05-28 DIAGNOSIS — E1042 Type 1 diabetes mellitus with diabetic polyneuropathy: Secondary | ICD-10-CM | POA: Diagnosis not present

## 2016-06-06 DIAGNOSIS — J449 Chronic obstructive pulmonary disease, unspecified: Secondary | ICD-10-CM | POA: Diagnosis not present

## 2016-06-17 DIAGNOSIS — M8589 Other specified disorders of bone density and structure, multiple sites: Secondary | ICD-10-CM | POA: Diagnosis not present

## 2016-06-17 DIAGNOSIS — Z780 Asymptomatic menopausal state: Secondary | ICD-10-CM | POA: Diagnosis not present

## 2016-06-17 DIAGNOSIS — Z1231 Encounter for screening mammogram for malignant neoplasm of breast: Secondary | ICD-10-CM | POA: Diagnosis not present

## 2016-06-20 DIAGNOSIS — F411 Generalized anxiety disorder: Secondary | ICD-10-CM | POA: Diagnosis not present

## 2016-06-20 DIAGNOSIS — Z794 Long term (current) use of insulin: Secondary | ICD-10-CM | POA: Diagnosis not present

## 2016-06-20 DIAGNOSIS — J449 Chronic obstructive pulmonary disease, unspecified: Secondary | ICD-10-CM | POA: Diagnosis not present

## 2016-06-20 DIAGNOSIS — K219 Gastro-esophageal reflux disease without esophagitis: Secondary | ICD-10-CM | POA: Diagnosis not present

## 2016-06-20 DIAGNOSIS — E785 Hyperlipidemia, unspecified: Secondary | ICD-10-CM | POA: Diagnosis not present

## 2016-06-20 DIAGNOSIS — E1142 Type 2 diabetes mellitus with diabetic polyneuropathy: Secondary | ICD-10-CM | POA: Diagnosis not present

## 2016-07-08 DIAGNOSIS — E1042 Type 1 diabetes mellitus with diabetic polyneuropathy: Secondary | ICD-10-CM | POA: Diagnosis not present

## 2016-07-08 DIAGNOSIS — E1065 Type 1 diabetes mellitus with hyperglycemia: Secondary | ICD-10-CM | POA: Diagnosis not present

## 2016-07-08 DIAGNOSIS — Z794 Long term (current) use of insulin: Secondary | ICD-10-CM | POA: Diagnosis not present

## 2016-07-24 ENCOUNTER — Encounter: Payer: Medicare Other | Attending: Internal Medicine | Admitting: *Deleted

## 2016-07-24 DIAGNOSIS — E1042 Type 1 diabetes mellitus with diabetic polyneuropathy: Secondary | ICD-10-CM | POA: Insufficient documentation

## 2016-07-24 DIAGNOSIS — M859 Disorder of bone density and structure, unspecified: Secondary | ICD-10-CM | POA: Diagnosis not present

## 2016-07-24 DIAGNOSIS — E1065 Type 1 diabetes mellitus with hyperglycemia: Secondary | ICD-10-CM | POA: Insufficient documentation

## 2016-07-24 DIAGNOSIS — Z713 Dietary counseling and surveillance: Secondary | ICD-10-CM | POA: Insufficient documentation

## 2016-07-24 DIAGNOSIS — F411 Generalized anxiety disorder: Secondary | ICD-10-CM | POA: Diagnosis not present

## 2016-07-24 DIAGNOSIS — Z794 Long term (current) use of insulin: Secondary | ICD-10-CM | POA: Diagnosis not present

## 2016-07-24 DIAGNOSIS — E1165 Type 2 diabetes mellitus with hyperglycemia: Secondary | ICD-10-CM

## 2016-07-24 DIAGNOSIS — IMO0002 Reserved for concepts with insufficient information to code with codable children: Secondary | ICD-10-CM

## 2016-07-24 DIAGNOSIS — E118 Type 2 diabetes mellitus with unspecified complications: Secondary | ICD-10-CM

## 2016-07-24 NOTE — Patient Instructions (Signed)
Plan:  Let's change your infusion set from Quick Set to Silhouette   Consider if total bolus is more than 25 units, just give Correction insulin first, then enter carbs and give meal bolus separately.   Let's meet again in 2 weeks to review progress with these two changes and assess pump settings then.   We will move towards Medtronic 630G which is much easier for you to manage than the buttons on the Paradigm pump with your arthritis.   We may consider Dexcom CGM which can be covered by Medicare and has a 6 month warranty to give you access to CGM until Medtronic CGM is covered too.

## 2016-07-24 NOTE — Progress Notes (Signed)
  Pump / CGM Follow Up Progress Note Date of visit: 07/24/2016 Start time: 0830   End time: 0930    Orders received from MD giving me permission to make insulin pump/CGM adjustments for this patient.  Patient is currently on Medtronic Paradigm pump  Reviewed blood glucose logs via Web Based Software and found the following:      Patient had several questions / issues to discuss today:  1. Due to her arthritis in hands it is very difficult to attach the Quick Set tubing to her site and she reports several instances when it did not attach and she went for some time without any insulin delivery.  2. She also has great difficulty pushing the buttons on the Paradigm pump 3. She occasionally goes over the Max Bolus amount of 25 units between her meal and correction insulin needs. She has been delivering the food insulin first and then the pump will not allow any correction dose due to Active Insulin amount.   4. She asked if she could give a correction bolus if she wasn't eating at that time   5. Patient is interested in CGM but Medtronic CGM is not yet covered.   Comments: We discussed the following: 1. I demonstrated the Silhouette and T-Slim infusion sets. She preferred the Silhouette and she could connect and disconnect it much easier. We called [employer] and got instructions on how to return her Quick Sets and they will replace with Silhouettes @ 23 inch tubing. 2. She practiced pushing buttons on 630G and they were much easier for her to operate 3. I instructed her to enter her BG first and deliver, then put in food separately so the food insulin does not take into consideration Active insulin and she will get the correct amount needed for both.  4. I encouraged her to check her BG at least 3 times a day and that anytime it was too high, consider that an opportunity to give her body insulin to correct it.  5. We discussed that Dexcom CGM can be covered by Medicare and that the  warranty is 6 months so if Medtronic is covered in the future, she could be able to switch once the 6 month Dexcom warranty is up. We will pursue that at our next visit in 2 weeks.   Plan:  Let's change your infusion set from Quick Set to Silhouette   Consider if total bolus is more than 25 units, just give Correction insulin first, then enter carbs and give meal bolus separately.   Let's meet again in 2 weeks to review progress with these two changes and assess pump settings then.   We will move towards Medtronic 630G which is much easier for you to manage than the buttons on the Paradigm pump with your arthritis.   We may consider Dexcom CGM which can be covered by Medicare and has a 6 month warranty to give you access to CGM until Medtronic CGM is covered too.   We will review your Carb Counting skills at our next visit also.  Follow up:  2 weeks for another upload to [facility] and assess pump settings after patient accomplishes what we discussed today.

## 2016-08-07 ENCOUNTER — Encounter: Payer: Medicare Other | Attending: Internal Medicine | Admitting: *Deleted

## 2016-08-07 DIAGNOSIS — E1165 Type 2 diabetes mellitus with hyperglycemia: Secondary | ICD-10-CM

## 2016-08-07 DIAGNOSIS — Z713 Dietary counseling and surveillance: Secondary | ICD-10-CM | POA: Insufficient documentation

## 2016-08-07 DIAGNOSIS — Z794 Long term (current) use of insulin: Secondary | ICD-10-CM | POA: Diagnosis not present

## 2016-08-07 DIAGNOSIS — E1042 Type 1 diabetes mellitus with diabetic polyneuropathy: Secondary | ICD-10-CM | POA: Insufficient documentation

## 2016-08-07 DIAGNOSIS — E1065 Type 1 diabetes mellitus with hyperglycemia: Secondary | ICD-10-CM | POA: Insufficient documentation

## 2016-08-07 DIAGNOSIS — IMO0002 Reserved for concepts with insufficient information to code with codable children: Secondary | ICD-10-CM

## 2016-08-07 DIAGNOSIS — E118 Type 2 diabetes mellitus with unspecified complications: Secondary | ICD-10-CM

## 2016-08-07 NOTE — Progress Notes (Signed)
  Pump / CGM Follow Up Progress Note Date of visit: 08/07/2016 Start time: 0830   End time: 0930    Orders received from MD giving me permission to make insulin pump/CGM adjustments for this patient.  Patient is currently on Medtronic Paradigm pump  Reviewed blood glucose logs via Web Based Software and found the following:         Pump Setting Changes in bold below  Basal Rate: Carb Ratio Sensitivity   MN: 1.20  (+)    3 AM 1.20  (+)    10 A 1.20    3 P 1.10                     Issues discussed today:  1. She is happy with the Silhouette infusion set, it is easier for her to connect and disconnect with her arthritis in her hands. She feels her BG have improved since she started using it.  2. She still goes over the Max Bolus amount of 25 units between her meal and correction insulin needs. We will review giving her bolus in 2 parts again today   3. She has started giving a correction bolus in the AM even when she wasn't eating at that time   4. Patient is still interested in CGM but Medtronic CGM is not yet covered. She still wants to wait until Medtronic is covered, and she is having less hypoglycemia lately.   Comments: We discussed the following: 1. I congratulated her that her average BG has improved from 343 to 268 mg/dl since late January between the new infusion set and correcting more BG's during the day!  2. Her FBG is still too high so I increased her over night basal rates by 10%  3. I instructed her again to enter her BG first and deliver, then put in food separately so the food insulin does not take into consideration Active insulin and she will get the correct amount needed for both.  4. I encouraged her to check her BG at least 2 times a day and that anytime it was too high, consider that an opportunity to give her body insulin to correct it.  5. We discussed that Dexcom CGM can be covered by Medicare and that the warranty is 6 months so if Medtronic is covered in  the future, she could be able to switch once the 6 month Dexcom warranty is up. She still wants to wait for Medtronic.  Plan:  Continue with the Silhouette infusion set   Remember that if total bolus is more than 25 units, just give Correction insulin first, then enter carbs and give meal bolus separately.   Let's meet again after your next visit with dr. Buddy Duty to further assess your BG control and pump settings   We will review your Carb Counting skills at our next visit also.  Follow up:  2 months, after next visit with Dr. Buddy Duty in March, 2018

## 2016-08-07 NOTE — Patient Instructions (Signed)
Plan:  Continue using the new infusion set Silhouette   Consider if total bolus is more than 25 units, just give Correction insulin first, then enter carbs and give meal bolus separately.   We will move towards Medtronic 630G which is much easier for you to manage than the buttons on the Paradigm pump with your arthritis.   You've done a great job starting to give correction bolus when BG is high but your aren't eating at that time. Keep that up!

## 2016-08-08 IMAGING — CR DG CHEST 2V
2 series · 2 of 2 positions shown · non-contrast
Comparison: 06/16/2013

CLINICAL DATA: Shortness of breath and cough today. Left sided
chest pain.

EXAM:
CHEST  2 VIEW

[w chest pa]
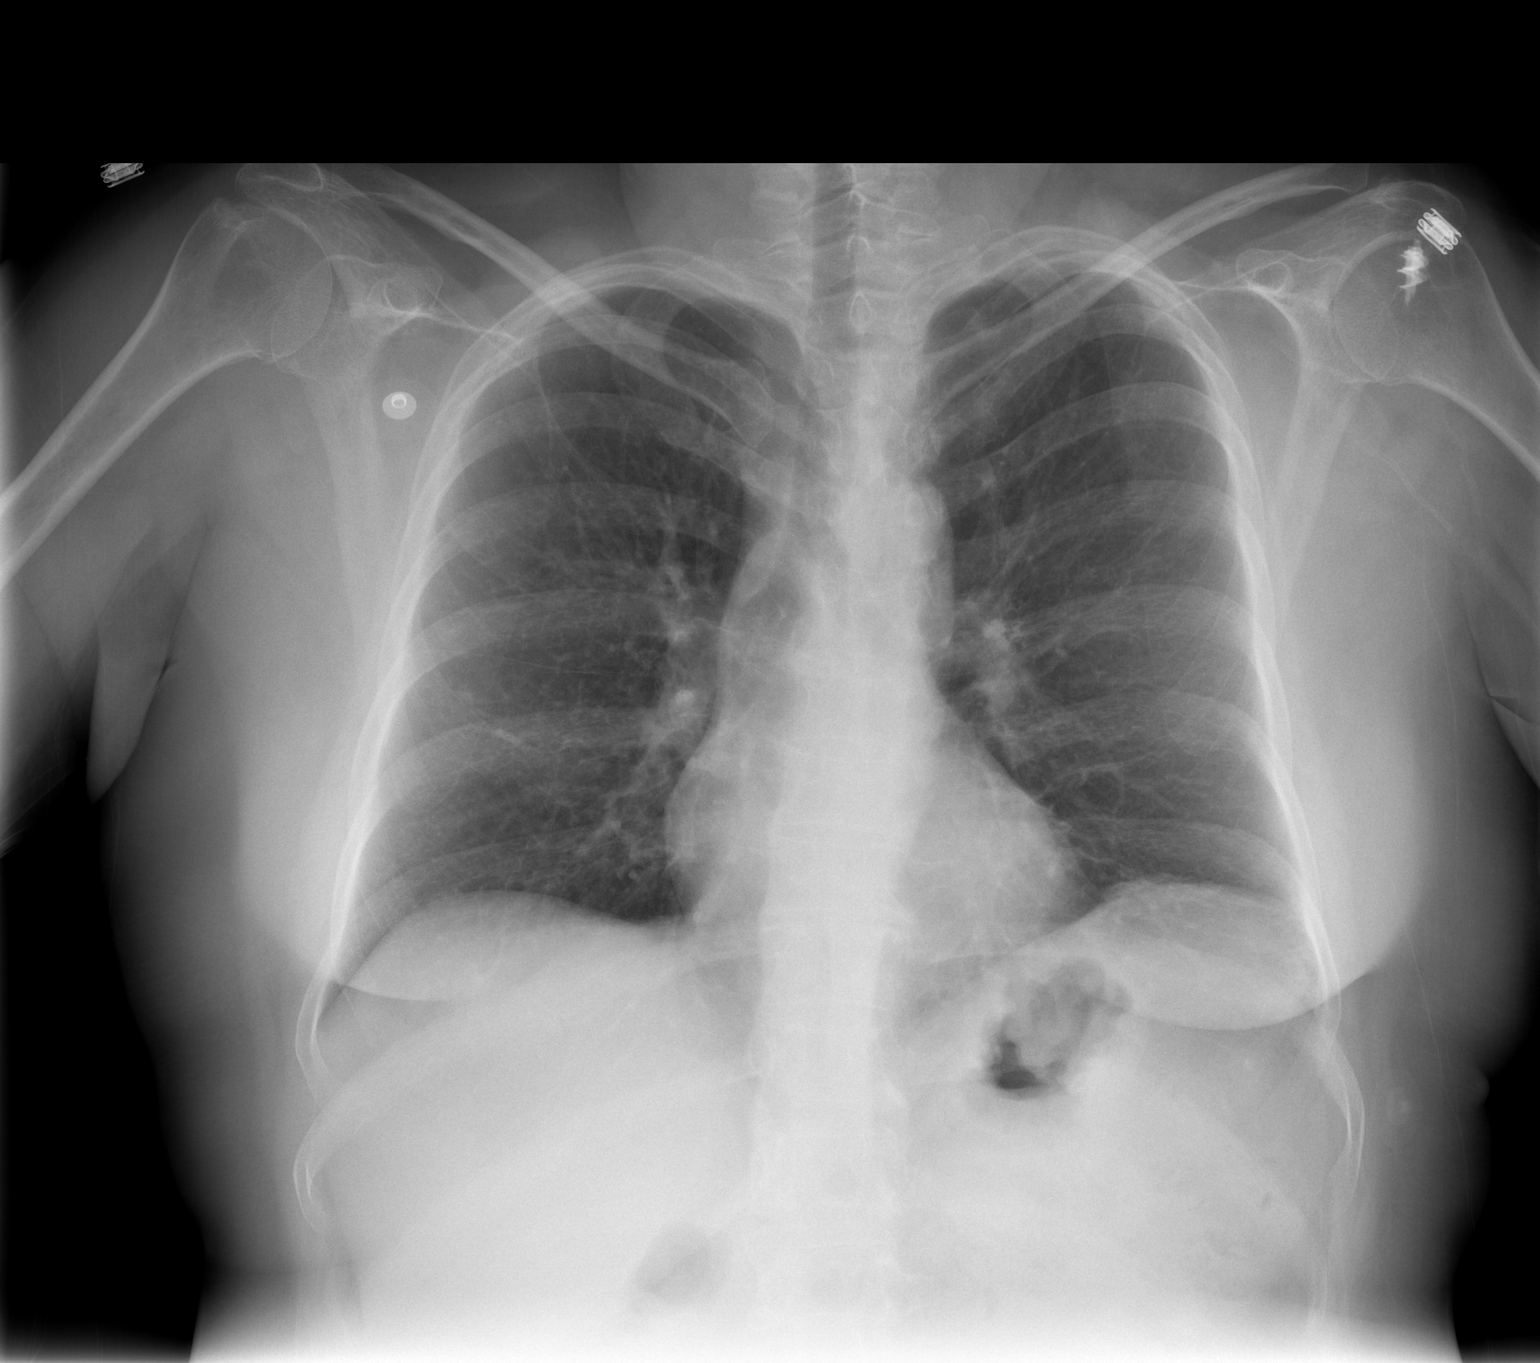

[w chest lat]
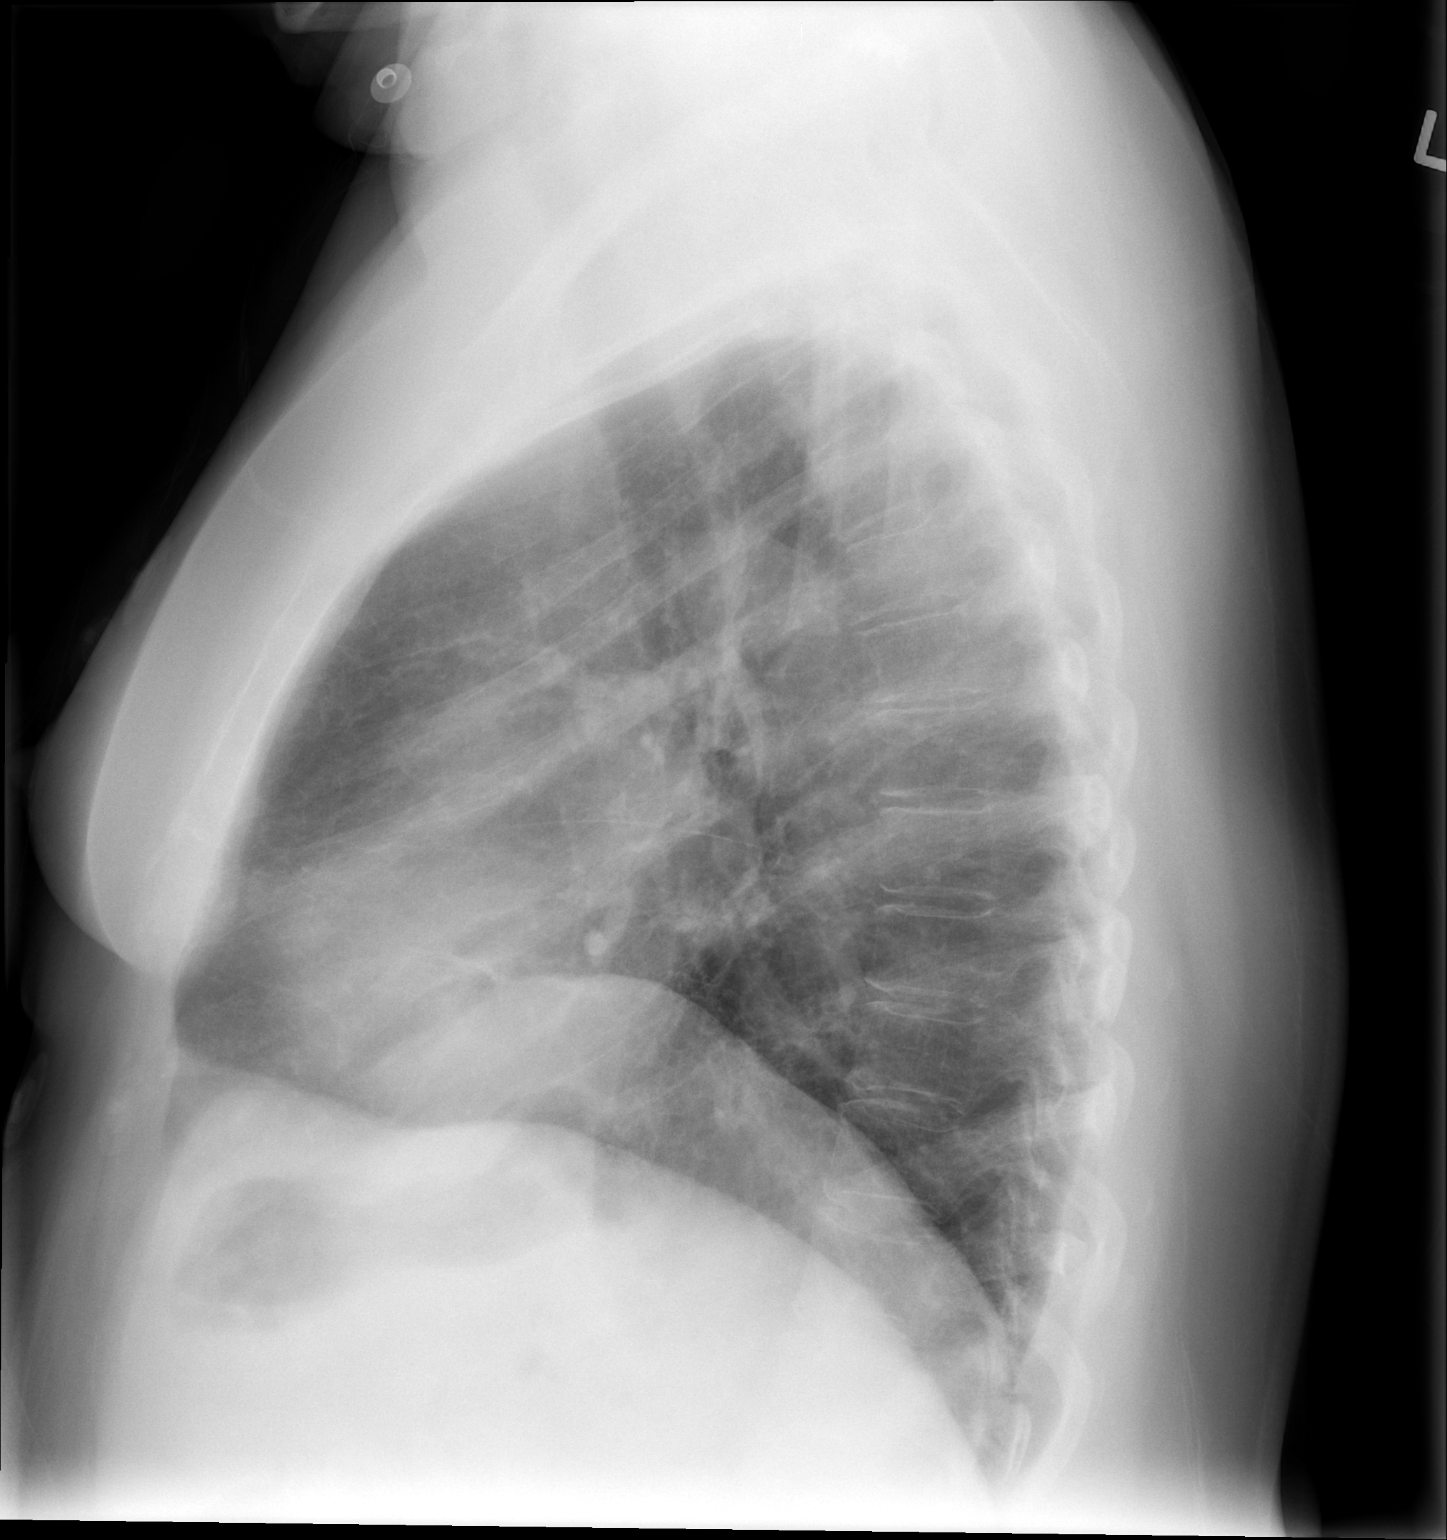

[2 of 2 positions shown; findings below may reference images not displayed]

FINDINGS: The heart size and mediastinal contours are within normal limits.
Both lungs are clear. The visualized skeletal structures are
unremarkable.
IMPRESSION: No active cardiopulmonary disease.

## 2016-10-25 DIAGNOSIS — E1042 Type 1 diabetes mellitus with diabetic polyneuropathy: Secondary | ICD-10-CM | POA: Diagnosis not present

## 2016-10-25 DIAGNOSIS — E1065 Type 1 diabetes mellitus with hyperglycemia: Secondary | ICD-10-CM | POA: Diagnosis not present

## 2016-10-25 DIAGNOSIS — Z794 Long term (current) use of insulin: Secondary | ICD-10-CM | POA: Diagnosis not present

## 2016-11-11 DIAGNOSIS — Z794 Long term (current) use of insulin: Secondary | ICD-10-CM | POA: Diagnosis not present

## 2016-11-11 DIAGNOSIS — E109 Type 1 diabetes mellitus without complications: Secondary | ICD-10-CM | POA: Diagnosis not present

## 2016-11-26 ENCOUNTER — Other Ambulatory Visit: Payer: Self-pay | Admitting: Internal Medicine

## 2016-11-26 ENCOUNTER — Ambulatory Visit
Admission: RE | Admit: 2016-11-26 | Discharge: 2016-11-26 | Disposition: A | Discharge status: Home / Self Care | Payer: Medicare Other | Source: Ambulatory Visit | Attending: Internal Medicine | Admitting: Internal Medicine

## 2016-11-26 DIAGNOSIS — R1030 Lower abdominal pain, unspecified: Secondary | ICD-10-CM | POA: Diagnosis not present

## 2016-11-26 DIAGNOSIS — K760 Fatty (change of) liver, not elsewhere classified: Secondary | ICD-10-CM | POA: Diagnosis not present

## 2016-11-26 DIAGNOSIS — R109 Unspecified abdominal pain: Secondary | ICD-10-CM

## 2016-11-26 DIAGNOSIS — K5730 Diverticulosis of large intestine without perforation or abscess without bleeding: Secondary | ICD-10-CM | POA: Diagnosis not present

## 2016-11-26 MED ORDER — IOPAMIDOL (ISOVUE-300) INJECTION 61%
100.0000 mL | Freq: Once | INTRAVENOUS | Status: AC | PRN
  Administered 2016-11-26: 100 mL via INTRAVENOUS

## 2016-12-02 DIAGNOSIS — R109 Unspecified abdominal pain: Secondary | ICD-10-CM | POA: Diagnosis not present

## 2016-12-17 DIAGNOSIS — R197 Diarrhea, unspecified: Secondary | ICD-10-CM | POA: Diagnosis not present

## 2016-12-20 DIAGNOSIS — E1142 Type 2 diabetes mellitus with diabetic polyneuropathy: Secondary | ICD-10-CM | POA: Diagnosis not present

## 2016-12-20 DIAGNOSIS — Z794 Long term (current) use of insulin: Secondary | ICD-10-CM | POA: Diagnosis not present

## 2016-12-20 DIAGNOSIS — F411 Generalized anxiety disorder: Secondary | ICD-10-CM | POA: Diagnosis not present

## 2016-12-20 DIAGNOSIS — J449 Chronic obstructive pulmonary disease, unspecified: Secondary | ICD-10-CM | POA: Diagnosis not present

## 2016-12-20 DIAGNOSIS — F17210 Nicotine dependence, cigarettes, uncomplicated: Secondary | ICD-10-CM | POA: Diagnosis not present

## 2016-12-20 DIAGNOSIS — K5730 Diverticulosis of large intestine without perforation or abscess without bleeding: Secondary | ICD-10-CM | POA: Diagnosis not present

## 2016-12-20 DIAGNOSIS — E1165 Type 2 diabetes mellitus with hyperglycemia: Secondary | ICD-10-CM | POA: Diagnosis not present

## 2016-12-20 DIAGNOSIS — F17200 Nicotine dependence, unspecified, uncomplicated: Secondary | ICD-10-CM | POA: Diagnosis not present

## 2016-12-25 DIAGNOSIS — E1065 Type 1 diabetes mellitus with hyperglycemia: Secondary | ICD-10-CM | POA: Diagnosis not present

## 2016-12-25 DIAGNOSIS — E1042 Type 1 diabetes mellitus with diabetic polyneuropathy: Secondary | ICD-10-CM | POA: Diagnosis not present

## 2016-12-25 DIAGNOSIS — Z794 Long term (current) use of insulin: Secondary | ICD-10-CM | POA: Diagnosis not present

## 2017-01-17 ENCOUNTER — Encounter: Payer: Medicare Other | Attending: Internal Medicine | Admitting: *Deleted

## 2017-01-17 DIAGNOSIS — E1165 Type 2 diabetes mellitus with hyperglycemia: Secondary | ICD-10-CM

## 2017-01-17 DIAGNOSIS — Z713 Dietary counseling and surveillance: Secondary | ICD-10-CM | POA: Insufficient documentation

## 2017-01-17 DIAGNOSIS — Z794 Long term (current) use of insulin: Secondary | ICD-10-CM

## 2017-01-17 DIAGNOSIS — IMO0002 Reserved for concepts with insufficient information to code with codable children: Secondary | ICD-10-CM

## 2017-01-17 DIAGNOSIS — E1042 Type 1 diabetes mellitus with diabetic polyneuropathy: Secondary | ICD-10-CM | POA: Diagnosis not present

## 2017-01-17 DIAGNOSIS — E118 Type 2 diabetes mellitus with unspecified complications: Secondary | ICD-10-CM

## 2017-01-17 NOTE — Progress Notes (Signed)
  Pump / CGM Follow Up Progress Note Date of visit: 08/07/2016 Start time: 0830   End time: 0930    Orders received from MD giving me permission to make insulin pump/CGM adjustments for this patient.  Patient is currently on Medtronic Paradigm pump She is having difficulty pushing the buttons on the pump to enter her food and BG information due to extreme pain with arthritis.. She is within her warranty with this pump until 10/2018. I decided to call the Help Line, Med[employer]onic is mailing her a replacement pump that hopefully with have "looser" buttons and she will be able to operate it. If not, there is a Remote Control that an be used to bolus through the pump. I don't know if Medicare will pay for the remote, so I contacted Roxann Ripple the local rep and she is looking into that.   Patient to contact me once she receives the replacement pump to let me know if she can operate the buttons better. If so, I will meet with her to transfer her settings. If not, we will pursue the remote control option.   Follow up: PRN.

## 2017-03-31 DIAGNOSIS — E1042 Type 1 diabetes mellitus with diabetic polyneuropathy: Secondary | ICD-10-CM | POA: Diagnosis not present

## 2017-03-31 DIAGNOSIS — E1065 Type 1 diabetes mellitus with hyperglycemia: Secondary | ICD-10-CM | POA: Diagnosis not present

## 2017-03-31 DIAGNOSIS — Z794 Long term (current) use of insulin: Secondary | ICD-10-CM | POA: Diagnosis not present

## 2017-04-07 DIAGNOSIS — Z23 Encounter for immunization: Secondary | ICD-10-CM | POA: Diagnosis not present

## 2017-04-08 DIAGNOSIS — E1042 Type 1 diabetes mellitus with diabetic polyneuropathy: Secondary | ICD-10-CM | POA: Diagnosis not present

## 2017-04-08 DIAGNOSIS — Z794 Long term (current) use of insulin: Secondary | ICD-10-CM | POA: Diagnosis not present

## 2017-05-15 DIAGNOSIS — E1042 Type 1 diabetes mellitus with diabetic polyneuropathy: Secondary | ICD-10-CM | POA: Diagnosis not present

## 2017-05-15 DIAGNOSIS — E1065 Type 1 diabetes mellitus with hyperglycemia: Secondary | ICD-10-CM | POA: Diagnosis not present

## 2017-05-15 DIAGNOSIS — Z794 Long term (current) use of insulin: Secondary | ICD-10-CM | POA: Diagnosis not present

## 2017-05-28 DIAGNOSIS — Z1389 Encounter for screening for other disorder: Secondary | ICD-10-CM | POA: Diagnosis not present

## 2017-05-28 DIAGNOSIS — E785 Hyperlipidemia, unspecified: Secondary | ICD-10-CM | POA: Diagnosis not present

## 2017-05-28 DIAGNOSIS — I10 Essential (primary) hypertension: Secondary | ICD-10-CM | POA: Diagnosis not present

## 2017-05-28 DIAGNOSIS — E1142 Type 2 diabetes mellitus with diabetic polyneuropathy: Secondary | ICD-10-CM | POA: Diagnosis not present

## 2017-05-28 DIAGNOSIS — Z0000 Encounter for general adult medical examination without abnormal findings: Secondary | ICD-10-CM | POA: Diagnosis not present

## 2017-05-28 DIAGNOSIS — J410 Simple chronic bronchitis: Secondary | ICD-10-CM | POA: Diagnosis not present

## 2017-05-28 DIAGNOSIS — K219 Gastro-esophageal reflux disease without esophagitis: Secondary | ICD-10-CM | POA: Diagnosis not present

## 2017-05-28 DIAGNOSIS — F418 Other specified anxiety disorders: Secondary | ICD-10-CM | POA: Diagnosis not present

## 2017-05-28 DIAGNOSIS — I700 Atherosclerosis of aorta: Secondary | ICD-10-CM | POA: Diagnosis not present

## 2017-05-28 DIAGNOSIS — Z716 Tobacco abuse counseling: Secondary | ICD-10-CM | POA: Diagnosis not present

## 2017-10-13 ENCOUNTER — Ambulatory Visit (HOSPITAL_COMMUNITY): Payer: Medicare Other | Admitting: Psychiatry

## 2017-12-09 ENCOUNTER — Ambulatory Visit (HOSPITAL_COMMUNITY): Payer: Medicare Other | Admitting: Psychiatry

## 2017-12-17 ENCOUNTER — Encounter: Payer: Medicare Other | Attending: Internal Medicine | Admitting: *Deleted

## 2017-12-24 ENCOUNTER — Ambulatory Visit: Payer: Self-pay | Admitting: *Deleted

## 2017-12-25 NOTE — Progress Notes (Signed)
Patient was shipped a different infusion set for her Medtronic pump (Mio instead of Quick Set) and she needed instruction on how to insert it.   I demonstrated the insertion and she completed it without difficulty.

## 2018-02-06 ENCOUNTER — Other Ambulatory Visit: Payer: Self-pay | Admitting: Internal Medicine

## 2018-02-06 DIAGNOSIS — N183 Chronic kidney disease, stage 3 unspecified: Secondary | ICD-10-CM

## 2018-02-10 ENCOUNTER — Encounter (HOSPITAL_COMMUNITY): Payer: Self-pay | Admitting: Psychiatry

## 2018-02-10 ENCOUNTER — Ambulatory Visit (INDEPENDENT_AMBULATORY_CARE_PROVIDER_SITE_OTHER): Payer: Medicare Other | Admitting: Psychiatry

## 2018-02-10 VITALS — BP 142/80 | HR 74 | Ht 59.0 in | Wt 125.0 lb

## 2018-02-10 DIAGNOSIS — F411 Generalized anxiety disorder: Secondary | ICD-10-CM | POA: Diagnosis not present

## 2018-02-10 MED ORDER — ALPRAZOLAM 0.5 MG PO TABS: 0.5000 mg | ORAL_TABLET | Freq: Two times a day (BID) | ORAL | 0 refills | Status: DC | PRN

## 2018-02-10 MED ORDER — DULOXETINE HCL 20 MG PO CPEP: 20.0000 mg | ORAL_CAPSULE | Freq: Every day | ORAL | 1 refills | Status: DC
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
## 2018-02-18 ENCOUNTER — Ambulatory Visit
Admission: RE | Admit: 2018-02-18 | Discharge: 2018-02-18 | Disposition: A | Discharge status: Home / Self Care | Payer: Medicare Other | Source: Ambulatory Visit | Attending: Internal Medicine | Admitting: Internal Medicine

## 2018-02-18 DIAGNOSIS — N183 Chronic kidney disease, stage 3 unspecified: Secondary | ICD-10-CM

## 2018-03-17 ENCOUNTER — Other Ambulatory Visit (HOSPITAL_COMMUNITY): Payer: Self-pay

## 2018-03-17 MED ORDER — ALPRAZOLAM 0.5 MG PO TABS: 0.5000 mg | ORAL_TABLET | Freq: Two times a day (BID) | ORAL | 0 refills | Status: DC | PRN

## 2018-03-19 ENCOUNTER — Ambulatory Visit (INDEPENDENT_AMBULATORY_CARE_PROVIDER_SITE_OTHER): Payer: Medicare Other | Admitting: Psychiatry

## 2018-03-19 ENCOUNTER — Encounter (HOSPITAL_COMMUNITY): Payer: Self-pay | Admitting: Psychiatry

## 2018-03-19 VITALS — BP 118/68 | HR 90 | Ht 58.5 in | Wt 121.0 lb

## 2018-03-19 DIAGNOSIS — F411 Generalized anxiety disorder: Secondary | ICD-10-CM

## 2018-03-19 DIAGNOSIS — F410 Panic disorder [episodic paroxysmal anxiety] without agoraphobia: Secondary | ICD-10-CM | POA: Diagnosis not present

## 2018-03-19 DIAGNOSIS — F4310 Post-traumatic stress disorder, unspecified: Secondary | ICD-10-CM

## 2018-03-19 MED ORDER — LORAZEPAM 0.5 MG PO TABS: 0.5000 mg | ORAL_TABLET | Freq: Two times a day (BID) | ORAL | 1 refills | Status: DC | PRN

## 2018-03-19 MED ORDER — DULOXETINE HCL 20 MG PO CPEP: ORAL_CAPSULE | ORAL | 1 refills | Status: DC
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
## 2018-05-18 ENCOUNTER — Ambulatory Visit (INDEPENDENT_AMBULATORY_CARE_PROVIDER_SITE_OTHER): Payer: Medicare Other | Admitting: Psychiatry

## 2018-05-18 ENCOUNTER — Encounter (HOSPITAL_COMMUNITY): Payer: Self-pay | Admitting: Psychiatry

## 2018-05-18 VITALS — BP 143/78 | HR 76 | Ht 59.5 in | Wt 124.0 lb

## 2018-05-18 DIAGNOSIS — F411 Generalized anxiety disorder: Secondary | ICD-10-CM | POA: Diagnosis not present

## 2018-05-18 DIAGNOSIS — F4310 Post-traumatic stress disorder, unspecified: Secondary | ICD-10-CM

## 2018-05-18 DIAGNOSIS — F410 Panic disorder [episodic paroxysmal anxiety] without agoraphobia: Secondary | ICD-10-CM

## 2018-05-18 MED ORDER — AMITRIPTYLINE HCL 25 MG PO TABS: 25.0000 mg | ORAL_TABLET | Freq: Every day | ORAL | 1 refills | Status: DC

## 2018-05-18 MED ORDER — LORAZEPAM 0.5 MG PO TABS: ORAL_TABLET | ORAL | 1 refills | Status: DC
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
## 2018-06-08 ENCOUNTER — Ambulatory Visit (HOSPITAL_COMMUNITY): Payer: Medicare Other | Admitting: Licensed Clinical Social Worker

## 2018-06-10 ENCOUNTER — Encounter: Payer: Self-pay | Admitting: Podiatry

## 2018-06-10 ENCOUNTER — Ambulatory Visit (INDEPENDENT_AMBULATORY_CARE_PROVIDER_SITE_OTHER): Payer: Medicare Other | Admitting: Podiatry

## 2018-06-10 VITALS — BP 107/72

## 2018-06-10 DIAGNOSIS — M79675 Pain in left toe(s): Secondary | ICD-10-CM

## 2018-06-10 DIAGNOSIS — M79674 Pain in right toe(s): Secondary | ICD-10-CM

## 2018-06-10 DIAGNOSIS — E1142 Type 2 diabetes mellitus with diabetic polyneuropathy: Secondary | ICD-10-CM

## 2018-06-10 DIAGNOSIS — B351 Tinea unguium: Secondary | ICD-10-CM | POA: Diagnosis not present

## 2018-06-10 NOTE — Patient Instructions (Addendum)
Diabetes and Foot Care Diabetes may cause you to have problems because of poor blood supply (circulation) to your feet and legs. This may cause the skin on your feet to become thinner, break easier, and heal more slowly. Your skin may become dry, and the skin may peel and crack. You may also have nerve damage in your legs and feet causing decreased feeling in them. You may not notice minor injuries to your feet that could lead to infections or more serious problems. Taking care of your feet is one of the most important things you can do for yourself. Follow these instructions at home:  Wear shoes at all times, even in the house. Do not go barefoot. Bare feet are easily injured.  Check your feet daily for blisters, cuts, and redness. If you cannot see the bottom of your feet, use a mirror or ask someone for help.  Wash your feet with warm water (do not use hot water) and mild soap. Then pat your feet and the areas between your toes until they are completely dry. Do not soak your feet as this can dry your skin.  Apply a moisturizing lotion or petroleum jelly (that does not contain alcohol and is unscented) to the skin on your feet and to dry, brittle toenails. Do not apply lotion between your toes.  Trim your toenails straight across. Do not dig under them or around the cuticle. File the edges of your nails with an emery board or nail file.  Do not cut corns or calluses or try to remove them with medicine.  Wear clean socks or stockings every day. Make sure they are not too tight. Do not wear knee-high stockings since they may decrease blood flow to your legs.  Wear shoes that fit properly and have enough cushioning. To break in new shoes, wear them for just a few hours a day. This prevents you from injuring your feet. Always look in your shoes before you put them on to be sure there are no objects inside.  Do not cross your legs. This may decrease the blood flow to your feet.  If you find a  minor scrape, cut, or break in the skin on your feet, keep it and the skin around it clean and dry. These areas may be cleansed with mild soap and water. Do not cleanse the area with peroxide, alcohol, or iodine.  When you remove an adhesive bandage, be sure not to damage the skin around it.  If you have a wound, look at it several times a day to make sure it is healing.  Do not use heating pads or hot water bottles. They may burn your skin. If you have lost feeling in your feet or legs, you may not know it is happening until it is too late.  Make sure your health care provider performs a complete foot exam at least annually or more often if you have foot problems. Report any cuts, sores, or bruises to your health care provider immediately. Contact a health care provider if:  You have an injury that is not healing.  You have cuts or breaks in the skin.  You have an ingrown nail.  You notice redness on your legs or feet.  You feel burning or tingling in your legs or feet.  You have pain or cramps in your legs and feet.  Your legs or feet are numb.  Your feet always feel cold. Get help right away if:  There is increasing   redness, swelling, or pain in or around a wound.  There is a red line that goes up your leg.  Pus is coming from a wound.  You develop a fever or as directed by your health care provider.  You notice a bad smell coming from an ulcer or wound. This information is not intended to replace advice given to you by your health care provider. Make sure you discuss any questions you have with your health care provider. Document Released: 06/14/2000 Document Revised: 11/23/2015 Document Reviewed: 11/24/2012 Elsevier Interactive Patient Education  2017 [employer]  Diabetic Neuropathy Diabetic neuropathy is a nerve disease or nerve damage that is caused by diabetes mellitus. About half of all people with diabetes mellitus have some form of nerve damage. Nerve damage  is more common in those who have had diabetes mellitus for many years and who generally have not had good control of their blood sugar (glucose) level. Diabetic neuropathy is a common complication of diabetes mellitus. There are three common types of diabetic neuropathy and a fourth type that is less common and less understood:  Peripheral neuropathy-This is the most common type of diabetic neuropathy. It causes damage to the nerves of the feet and legs first and then eventually the hands and arms. The damage affects the ability to sense touch.  Autonomic neuropathy-This type causes damage to the autonomic nervous system, which controls the following functions: ? Heartbeat. ? Body temperature. ? Blood pressure. ? Urination. ? Digestion. ? Sweating. ? Sexual function.  Focal neuropathy-Focal neuropathy can be painful and unpredictable and occurs most often in older adults with diabetes mellitus. It involves a specific nerve or one area and often comes on suddenly. It usually does not cause long-term problems.  Radiculoplexus neuropathy- Sometimes called lumbosacral radiculoplexus neuropathy, radiculoplexus neuropathy affects the nerves of the thighs, hips, buttocks, or legs. It is more common in people with type 2 diabetes mellitus and in older men. It is characterized by debilitating pain, weakness, and atrophy, usually in the thigh muscles.  What are the causes? The cause of peripheral, autonomic, and focal neuropathies is diabetes mellitus that is uncontrolled and high glucose levels. The cause of radiculoplexus neuropathy is unknown. However, it is thought to be caused by inflammation related to uncontrolled glucose levels. What are the signs or symptoms? Peripheral Neuropathy Peripheral neuropathy develops slowly over time. When the nerves of the feet and legs no longer work there may be:  Burning, stabbing, or aching pain in the legs or feet.  Inability to feel pressure or pain in your  feet. This can lead to: ? Thick calluses over pressure areas. ? Pressure sores. ? Ulcers.  Foot deformities.  Reduced ability to feel temperature changes.  Muscle weakness.  Autonomic Neuropathy The symptoms of autonomic neuropathy vary depending on which nerves are affected. Symptoms may include:  Problems with digestion, such as: ? Feeling sick to your stomach (nausea). ? Vomiting. ? Bloating. ? Constipation. ? Diarrhea. ? Abdominal pain.  Difficulty with urination. This occurs if you lose your ability to sense when your bladder is full. Problems include: ? Urine leakage (incontinence). ? Inability to empty your bladder completely (retention).  Rapid or irregular heartbeat (palpitations).  Blood pressure drops when you stand up (orthostatic hypotension). When you stand up you may feel: ? Dizzy. ? Weak. ? Faint.  In men, inability to attain and maintain an erection.  In women, vaginal dryness and problems with decreased sexual desire and arousal.  Problems with body temperature   regulation.  Increased or decreased sweating.  Focal Neuropathy  Abnormal eye movements or abnormal alignment of both eyes.  Weakness in the wrist.  Foot drop. This results in an inability to lift the foot properly and abnormal walking or foot movement.  Paralysis on one side of your face (Bell palsy).  Chest or abdominal pain. Radiculoplexus Neuropathy  Sudden, severe pain in your hip, thigh, or buttocks.  Weakness and wasting of thigh muscles.  Difficulty rising from a seated position.  Abdominal swelling.  Unexplained weight loss (usually more than 10 lb [4.5 kg]). How is this diagnosed? Peripheral Neuropathy Your senses may be tested. Sensory function testing can be done with:  A light touch using a monofilament.  A vibration with tuning fork.  A sharp sensation with a pin prick.  Other tests that can help diagnose neuropathy are:  Nerve conduction velocity. This  test checks the transmission of an electrical current through a nerve.  Electromyography. This shows how muscles respond to electrical signals transmitted by nearby nerves.  Quantitative sensory testing. This is used to assess how your nerves respond to vibrations and changes in temperature.  Autonomic Neuropathy Diagnosis is often based on reported symptoms. Tell your health care provider if you experience:  Dizziness.  Constipation.  Diarrhea.  Inappropriate urination or inability to urinate.  Inability to get or maintain an erection.  Tests that may be done include:  Electrocardiography or Holter monitor. These are tests that can help show problems with the heart rate or heart rhythm.  An X-ray exam may be done.  Focal Neuropathy Diagnosis is made based on your symptoms and what your health care provider finds during your exam. Other tests may be done. They may include:  Nerve conduction velocities. This checks the transmission of electrical current through a nerve.  Electromyography. This shows how muscles respond to electrical signals transmitted by nearby nerves.  Quantitative sensory testing. This test is used to assess how your nerves respond to vibration and changes in temperature.  Radiculoplexus Neuropathy  Often the first thing is to eliminate any other issue or problems that might be the cause, as there is no standard test for diagnosis.  X-ray exam of your spine and lumbar region.  Spinal tap to rule out cancer.  MRI to rule out other lesions. How is this treated? Once nerve damage occurs, it cannot be reversed. The goal of treatment is to keep the disease or nerve damage from getting worse and affecting more nerve fibers. Controlling your blood glucose level is the key. Most people with radiculoplexus neuropathy see at least a partial improvement over time. You will need to keep your blood glucose and HbA1c levels in the target range determined by your  health care provider. Things that help control blood glucose levels include:  Blood glucose monitoring.  Meal planning.  Physical activity.  Diabetes medicine.  Over time, maintaining lower blood glucose levels helps lessen symptoms. Sometimes, prescription pain medicine is needed. Follow these instructions at home:  Do not smoke.  Keep your blood glucose level in the range that you and your health care provider have determined acceptable for you.  Keep your blood pressure level in the range that you and your health care provider have determined acceptable for you.  Eat a well-balanced diet.  Be physically active every day. Include strength training and balance exercises.  Protect your feet. ? Check your feet every day for sores, cuts, blisters, or signs of infection. ? Wear padded socks   and supportive shoes. Use orthotic inserts, if necessary. ? Regularly check the insides of your shoes for worn spots. Make sure there are no rocks or other items inside your shoes before you put them on. Contact a health care provider if:  You have burning, stabbing, or aching pain in the legs or feet.  You are unable to feel pressure or pain in your feet.  You develop problems with digestion such as: ? Nausea. ? Vomiting. ? Bloating. ? Constipation. ? Diarrhea. ? Abdominal pain.  You have difficulty with urination, such as: ? Incontinence. ? Retention.  You have palpitations.  You develop orthostatic hypotension. When you stand up you may feel: ? Dizzy. ? Weak. ? Faint.  You cannot attain and maintain an erection (in men).  You have vaginal dryness and problems with decreased sexual desire and arousal (in women).  You have severe pain in your thighs, legs, or buttocks.  You have unexplained weight loss. This information is not intended to replace advice given to you by your health care provider. Make sure you discuss any questions you have with your health care  provider. Document Released: 08/26/2001 Document Revised: 11/23/2015 Document Reviewed: 11/26/2012 Elsevier Interactive Patient Education  2017 [employer] Inc.  

## 2018-07-11 ENCOUNTER — Encounter: Payer: Self-pay | Admitting: Podiatry

## 2018-07-11 NOTE — Progress Notes (Signed)
Subjective: [name] presents today referred by Dr. Delrae Rend, with cc of painful, discolored, thick toenails which interfere with daily activities.  Duration is greater than 2 months. Pain is aggravated when wearing enclosed shoe gear.   Past Medical History:  Diagnosis Date  . Anxiety   . Arthritis   . COPD (chronic obstructive pulmonary disease) ([place])   . Depression   . Diabetes mellitus   . Diabetic neuropathy ([place])   . Diverticulosis   . Drug addiction ([place]) clean for 18 months   cocaine  . Fibromyalgia   . Full dentures   . GERD (gastroesophageal reflux disease)   . Hyperlipidemia   . Internal hemorrhoids   . PONV (postoperative nausea and vomiting)   . Rheumatoid arthritis(714.0)   . Wears glasses      Patient Active Problem List   Diagnosis Date Noted  . Diverticulitis 04/28/2015  . Dysphagia, unspecified(787.20) 03/14/2014  . Stroke ([place]) 03/13/2014  . Dizziness 03/13/2014  . Diabetes mellitus type 2, uncontrolled ([place]) 03/13/2014  . COPD (chronic obstructive pulmonary disease) ([place]) 03/13/2014  . Chest pain, atypical 03/13/2014  . Low back pain radiating to right lower extremity 03/02/2014     Past Surgical History:  Procedure Laterality Date  . APPENDECTOMY    . CATARACT EXTRACTION  2009   bilateral  . CESAREAN SECTION    . dental surgery  2011   all teeth removed  . EYE SURGERY  08,09   both cataracts  . HERNIA REPAIR     incisional  . KNEE ARTHROSCOPY  Right  . KNEE ARTHROSCOPY     lt and rt-2014  . rotatator cuff repair  2001   left  . TONSILLECTOMY    . TRIGGER FINGER RELEASE Right 12/20/2013   Procedure: RIGHT THUMB TRIGGER  RELEASE ;  Surgeon: Tennis Must, MD;  Location: [facility];  Service: Orthopedics;  Laterality: Right;  . TUBAL LIGATION  2002      Current Outpatient Medications:  .  albuterol (PROVENTIL HFA;VENTOLIN HFA) 108 (90 BASE) MCG/ACT inhaler, Inhale 1 puff into the lungs every 6 (six) hours as needed  for wheezing or shortness of breath. Reported on 11/09/2015, Disp: , Rfl:  .  amitriptyline (ELAVIL) 25 MG tablet, Take 1 tablet (25 mg total) by mouth at bedtime., Disp: 30 tablet, Rfl: 1 .  aspirin EC 81 MG tablet, Take 81 mg by mouth daily., Disp: , Rfl:  .  Coenzyme Q10 100 MG capsule, Take 100 mg by mouth daily., Disp: , Rfl:  .  CONTOUR NEXT TEST test strip, , Disp: , Rfl:  .  fluticasone (FLONASE) 50 MCG/ACT nasal spray, , Disp: , Rfl:  .  insulin lispro (HUMALOG) 100 UNIT/ML injection, Inject into the skin every morning. Insulin pump-base rate-, Disp: , Rfl:  .  lisinopril (PRINIVIL,ZESTRIL) 5 MG tablet, Take 5 mg by mouth daily., Disp: , Rfl:  .  LIVALO 4 MG TABS, Take 4 mg by mouth daily. Reported on 11/09/2015, Disp: , Rfl: 0 .  LORazepam (ATIVAN) 0.5 MG tablet, Take one tab twice a day and 3rd one as needed for panic attack, Disp: 75 tablet, Rfl: 1     Allergies     PenicillinsRash  Sulfa AntibioticsHives, Shortness Of Breath  StatinsOther (See Comments)  CodeineNausea Only  Keflet [Cephalexin]Palpitations     Social History   Occupational History  . Occupation: disabled  Tobacco Use  . Smoking status: Current Some Day Smoker    Packs/day:  0.50    Types: Cigarettes  . Smokeless tobacco: Never Used  . Tobacco comment: Pt. smokes 6 a day, trying to quit  Substance and Sexual Activity  . Alcohol use: No  . Drug use: No    Comment: former cocaine addiction  . Sexual activity: Never     Family History  Problem Relation Age of Onset  . Breast cancer Mother   . Breast cancer Maternal Aunt   . Colon cancer Maternal Grandfather   . Diabetes Paternal Grandmother   . Heart failure Maternal Grandmother        CHF     There is no immunization history for the selected administration types on file for this patient.   Review of systems: Positive Findings in bold print.  Constitutional:  chills, fatigue, fever, sweats, weight change Communication: Optometrist, sign  Ecologist, hand writing, iPad/Android device Head: headaches, head injury Eyes: changes in vision, eye pain, glaucoma, cataracts, macular degeneration, diplopia, glare,  light sensitivity, eyeglasses or contacts, blindness Ears nose mouth throat: Hard of hearing, ringing in ears, deaf, sign language,  vertigo,   nosebleeds,  rhinitis,  cold sores, snoring, swollen glands Cardiovascular: HTN, edema, arrhythmia, pacemaker in place, defibrillator in place,  chest pain/tightness, chronic anticoagulation, blood clot, heart failure Peripheral Vascular: leg cramps, varicose veins, blood clots, lymphedema Respiratory:  difficulty breathing, denies congestion, SOB, wheezing, cough, emphysema Gastrointestinal: change in appetite or weight, abdominal pain, constipation, diarrhea, nausea, vomiting, vomiting blood, change in bowel habits, abdominal pain, jaundice, rectal bleeding, hemorrhoids, Genitourinary:  nocturia,  pain on urination,  blood in urine, Foley catheter, urinary urgency Musculoskeletal: uses mobility aid,  cramping, stiff joints, painful joints, decreased joint motion, fractures, OA, gout, RA Skin: +changes in toenails, color change, dryness, itching, mole changes,  rash  Neurological: headaches, numbness in feet, paresthesias in feet, burning in feet, fainting,  seizures, change in speech. denies headaches, memory problems/poor historian, cerebral palsy, weakness, paralysis Endocrine: diabetes, hypothyroidism, hyperthyroidism,  goiter, dry mouth, flushing, heat intolerance,  cold intolerance,  excessive thirst, denies polyuria,  nocturia Hematological:  easy bleeding, excessive bleeding, easy bruising, enlarged lymph nodes, on long term blood thinner, history of past transusions Allergy/immunological:  hives, eczema, frequent infections, multiple drug allergies, seasonal allergies, transplant recipient Psychiatric:  anxiety, depression, mood disorder, suicidal ideations, hallucinations    Objective: Vascular Examination: Capillary refill time immediate x 10 digits Dorsalis pedis and posterior tibial pulses present b/l digital hair present x 10 digits Skin temperature gradient WNL b/l  Dermatological Examination: Skin with normal turgor, texture and tone b/l  Toenails 1-5 b/l discolored, thick, dystrophic with subungual debris and pain with palpation to nailbeds due to thickness of nails.  Musculoskeletal: Muscle strength 5/5 to all LE muscle groups  HAV with bunion b/l  Hammertoes 2-5 b/l  Neurological: Sensation intact with 10 gram monofilament  Vibratory sensation diminished b/l  Assessment: 1. Painful onychomycosis toenails 1-5 b/l  2. NIDDM with neuropathy    Plan: 1. Discussed onychomycosis and treatment options.  Literature dispensed on today. 2. Discussed diabetic foot care principles. Literature dispensed on today. 3. Toenails 1-5 b/l were debrided in length and girth without iatrogenic bleeding. 4. Patient to continue soft, supportive shoe gear 5. Patient to report any pedal injuries to medical professional immediately. 6. Follow up 3 months. Patient/POA to call should there be a concern in the interim.

## 2018-07-15 ENCOUNTER — Other Ambulatory Visit (HOSPITAL_COMMUNITY): Payer: Self-pay

## 2018-07-15 DIAGNOSIS — F4310 Post-traumatic stress disorder, unspecified: Secondary | ICD-10-CM

## 2018-07-15 DIAGNOSIS — F411 Generalized anxiety disorder: Secondary | ICD-10-CM

## 2018-07-15 DIAGNOSIS — F410 Panic disorder [episodic paroxysmal anxiety] without agoraphobia: Secondary | ICD-10-CM

## 2018-07-15 MED ORDER — LORAZEPAM 0.5 MG PO TABS: ORAL_TABLET | ORAL | 0 refills | Status: DC

## 2018-07-15 MED ORDER — AMITRIPTYLINE HCL 25 MG PO TABS: 25.0000 mg | ORAL_TABLET | Freq: Every day | ORAL | 0 refills | Status: DC

## 2018-07-20 ENCOUNTER — Ambulatory Visit (INDEPENDENT_AMBULATORY_CARE_PROVIDER_SITE_OTHER): Payer: Medicare Other | Admitting: Psychiatry

## 2018-07-20 ENCOUNTER — Encounter (HOSPITAL_COMMUNITY): Payer: Self-pay | Admitting: Psychiatry

## 2018-07-20 DIAGNOSIS — F4310 Post-traumatic stress disorder, unspecified: Secondary | ICD-10-CM

## 2018-07-20 DIAGNOSIS — F410 Panic disorder [episodic paroxysmal anxiety] without agoraphobia: Secondary | ICD-10-CM

## 2018-07-20 DIAGNOSIS — F411 Generalized anxiety disorder: Secondary | ICD-10-CM

## 2018-07-20 MED ORDER — LORAZEPAM 0.5 MG PO TABS: ORAL_TABLET | ORAL | 1 refills | Status: DC

## 2018-07-20 MED ORDER — AMITRIPTYLINE HCL 50 MG PO TABS: 50.0000 mg | ORAL_TABLET | Freq: Every day | ORAL | 1 refills | Status: DC
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
## 2018-09-03 DIAGNOSIS — Z794 Long term (current) use of insulin: Secondary | ICD-10-CM | POA: Insufficient documentation

## 2018-09-03 DIAGNOSIS — Z961 Presence of intraocular lens: Secondary | ICD-10-CM | POA: Insufficient documentation

## 2018-09-03 DIAGNOSIS — E119 Type 2 diabetes mellitus without complications: Secondary | ICD-10-CM | POA: Insufficient documentation

## 2018-09-09 ENCOUNTER — Ambulatory Visit: Payer: Medicare Other | Admitting: Podiatry

## 2018-09-17 ENCOUNTER — Other Ambulatory Visit (HOSPITAL_COMMUNITY): Payer: Self-pay

## 2018-09-17 DIAGNOSIS — F4310 Post-traumatic stress disorder, unspecified: Secondary | ICD-10-CM

## 2018-09-17 DIAGNOSIS — F411 Generalized anxiety disorder: Secondary | ICD-10-CM

## 2018-09-17 DIAGNOSIS — F410 Panic disorder [episodic paroxysmal anxiety] without agoraphobia: Secondary | ICD-10-CM

## 2018-09-17 MED ORDER — AMITRIPTYLINE HCL 50 MG PO TABS: 50.0000 mg | ORAL_TABLET | Freq: Every day | ORAL | 1 refills | Status: DC

## 2018-09-17 MED ORDER — LORAZEPAM 0.5 MG PO TABS: ORAL_TABLET | ORAL | 1 refills | Status: DC

## 2018-09-21 ENCOUNTER — Ambulatory Visit (HOSPITAL_COMMUNITY): Payer: Medicare Other | Admitting: Psychiatry

## 2018-11-25 ENCOUNTER — Other Ambulatory Visit (HOSPITAL_COMMUNITY): Payer: Self-pay

## 2018-11-25 DIAGNOSIS — F410 Panic disorder [episodic paroxysmal anxiety] without agoraphobia: Secondary | ICD-10-CM

## 2018-11-25 DIAGNOSIS — F411 Generalized anxiety disorder: Secondary | ICD-10-CM

## 2018-11-25 DIAGNOSIS — F4310 Post-traumatic stress disorder, unspecified: Secondary | ICD-10-CM

## 2018-11-25 MED ORDER — AMITRIPTYLINE HCL 50 MG PO TABS: 50.0000 mg | ORAL_TABLET | Freq: Every day | ORAL | 1 refills | Status: DC

## 2018-11-25 MED ORDER — LORAZEPAM 0.5 MG PO TABS: ORAL_TABLET | ORAL | 1 refills | Status: DC

## 2018-12-30 ENCOUNTER — Ambulatory Visit (HOSPITAL_COMMUNITY): Payer: Medicare Other | Admitting: Psychiatry

## 2019-02-23 ENCOUNTER — Telehealth (HOSPITAL_COMMUNITY): Payer: Self-pay

## 2019-02-23 NOTE — Telephone Encounter (Signed)
I called and left patient a voicemail letting her know that she needs to schedule an appointment

## 2019-02-23 NOTE — Telephone Encounter (Signed)
Patient called, she was very upset and states that the Ativan is not working at all. Patient wants to go back on Xanax 5 mg bid. Patient reports daily anxiety and panic attacks. Please review and advise, thank you

## 2019-02-23 NOTE — Telephone Encounter (Signed)
I have not seen her since January this year.  She has been taking lorazepam for more than a year.  Not sure why it is not working and now she like to go back on Xanax.  We do not have any appointment scheduled.  She need to schedule her next appointment.  She need to stay on her current medication until we evaluate her.

## 2019-02-23 NOTE — Telephone Encounter (Signed)
Patient called back. Relayed the message from the doctor and transferred her to the front desk to make an appointment

## 2019-02-26 ENCOUNTER — Encounter (HOSPITAL_COMMUNITY): Payer: Self-pay | Admitting: Psychiatry

## 2019-02-26 ENCOUNTER — Other Ambulatory Visit: Payer: Self-pay

## 2019-02-26 ENCOUNTER — Ambulatory Visit (INDEPENDENT_AMBULATORY_CARE_PROVIDER_SITE_OTHER): Payer: Medicare Other | Admitting: Psychiatry

## 2019-02-26 VITALS — Wt 116.0 lb

## 2019-02-26 DIAGNOSIS — F411 Generalized anxiety disorder: Secondary | ICD-10-CM

## 2019-02-26 DIAGNOSIS — F410 Panic disorder [episodic paroxysmal anxiety] without agoraphobia: Secondary | ICD-10-CM | POA: Diagnosis not present

## 2019-02-26 DIAGNOSIS — F4310 Post-traumatic stress disorder, unspecified: Secondary | ICD-10-CM | POA: Diagnosis not present

## 2019-02-26 MED ORDER — ALPRAZOLAM 0.5 MG PO TABS: 0.5000 mg | ORAL_TABLET | Freq: Two times a day (BID) | ORAL | 1 refills | Status: DC | PRN

## 2019-02-26 MED ORDER — MIRTAZAPINE 15 MG PO TABS: 15.0000 mg | ORAL_TABLET | Freq: Every day | ORAL | 1 refills | Status: DC
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
## 2019-03-03 ENCOUNTER — Other Ambulatory Visit: Payer: Self-pay | Admitting: Internal Medicine

## 2019-03-03 ENCOUNTER — Ambulatory Visit
Admission: RE | Admit: 2019-03-03 | Discharge: 2019-03-03 | Disposition: A | Discharge status: Home / Self Care | Payer: Medicare Other | Source: Ambulatory Visit | Attending: Internal Medicine | Admitting: Internal Medicine

## 2019-03-03 DIAGNOSIS — R059 Cough, unspecified: Secondary | ICD-10-CM

## 2019-03-03 DIAGNOSIS — R0602 Shortness of breath: Secondary | ICD-10-CM

## 2019-03-03 DIAGNOSIS — R05 Cough: Secondary | ICD-10-CM

## 2019-03-11 DIAGNOSIS — M25561 Pain in right knee: Secondary | ICD-10-CM | POA: Insufficient documentation

## 2019-03-22 ENCOUNTER — Ambulatory Visit (INDEPENDENT_AMBULATORY_CARE_PROVIDER_SITE_OTHER): Payer: Medicare Other | Admitting: Psychiatry

## 2019-03-22 ENCOUNTER — Other Ambulatory Visit: Payer: Self-pay

## 2019-03-22 ENCOUNTER — Encounter (HOSPITAL_COMMUNITY): Payer: Self-pay | Admitting: Psychiatry

## 2019-03-22 DIAGNOSIS — F411 Generalized anxiety disorder: Secondary | ICD-10-CM | POA: Diagnosis not present

## 2019-03-22 DIAGNOSIS — F4310 Post-traumatic stress disorder, unspecified: Secondary | ICD-10-CM

## 2019-03-22 DIAGNOSIS — F410 Panic disorder [episodic paroxysmal anxiety] without agoraphobia: Secondary | ICD-10-CM | POA: Diagnosis not present

## 2019-03-22 MED ORDER — ALPRAZOLAM 0.5 MG PO TABS: 0.5000 mg | ORAL_TABLET | Freq: Two times a day (BID) | ORAL | 2 refills | Status: DC | PRN

## 2019-03-22 MED ORDER — MIRTAZAPINE 15 MG PO TABS: 15.0000 mg | ORAL_TABLET | Freq: Every day | ORAL | 2 refills | Status: DC
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
## 2019-03-29 ENCOUNTER — Ambulatory Visit (HOSPITAL_COMMUNITY): Payer: Medicare Other | Admitting: Psychiatry

## 2019-03-30 DIAGNOSIS — M25512 Pain in left shoulder: Secondary | ICD-10-CM | POA: Insufficient documentation

## 2019-06-07 ENCOUNTER — Telehealth (HOSPITAL_COMMUNITY): Payer: Self-pay | Admitting: *Deleted

## 2019-06-07 NOTE — Telephone Encounter (Signed)
Pt left message requesting refills on Remeron and Xanax. Medications last refilled on 03/14/19 with 2 refills ordered. Pt has an appointment on 07/15/19 and is afraid she will run out prior to appointment. Please review and advise.

## 2019-06-07 NOTE — Telephone Encounter (Signed)
Her next appointment is on December 14 and she should not be running out before her appointment.

## 2019-06-14 ENCOUNTER — Ambulatory Visit (INDEPENDENT_AMBULATORY_CARE_PROVIDER_SITE_OTHER): Payer: Medicare Other | Admitting: Psychiatry

## 2019-06-14 ENCOUNTER — Encounter (HOSPITAL_COMMUNITY): Payer: Self-pay | Admitting: Psychiatry

## 2019-06-14 ENCOUNTER — Other Ambulatory Visit: Payer: Self-pay

## 2019-06-14 DIAGNOSIS — F4310 Post-traumatic stress disorder, unspecified: Secondary | ICD-10-CM | POA: Diagnosis not present

## 2019-06-14 DIAGNOSIS — F411 Generalized anxiety disorder: Secondary | ICD-10-CM

## 2019-06-14 DIAGNOSIS — F410 Panic disorder [episodic paroxysmal anxiety] without agoraphobia: Secondary | ICD-10-CM

## 2019-06-14 MED ORDER — MIRTAZAPINE 30 MG PO TABS: 30.0000 mg | ORAL_TABLET | Freq: Every day | ORAL | 2 refills | Status: DC

## 2019-06-14 MED ORDER — ALPRAZOLAM 0.5 MG PO TABS: 0.5000 mg | ORAL_TABLET | Freq: Two times a day (BID) | ORAL | 2 refills | Status: DC | PRN
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
## 2019-07-22 ENCOUNTER — Emergency Department (HOSPITAL_COMMUNITY): Payer: Medicare Other

## 2019-07-22 ENCOUNTER — Other Ambulatory Visit: Payer: Self-pay

## 2019-07-22 ENCOUNTER — Emergency Department (HOSPITAL_COMMUNITY)
Admission: EM | Admit: 2019-07-22 | Discharge: 2019-07-22 | Disposition: A | Discharge status: Home / Self Care | Payer: Medicare Other | Attending: Emergency Medicine | Admitting: Emergency Medicine

## 2019-07-22 ENCOUNTER — Encounter (HOSPITAL_COMMUNITY): Payer: Self-pay | Admitting: Emergency Medicine

## 2019-07-22 DIAGNOSIS — Z20822 Contact with and (suspected) exposure to covid-19: Secondary | ICD-10-CM | POA: Diagnosis not present

## 2019-07-22 DIAGNOSIS — Z79899 Other long term (current) drug therapy: Secondary | ICD-10-CM | POA: Diagnosis not present

## 2019-07-22 DIAGNOSIS — Z7982 Long term (current) use of aspirin: Secondary | ICD-10-CM | POA: Insufficient documentation

## 2019-07-22 DIAGNOSIS — E119 Type 2 diabetes mellitus without complications: Secondary | ICD-10-CM | POA: Insufficient documentation

## 2019-07-22 DIAGNOSIS — R05 Cough: Secondary | ICD-10-CM | POA: Diagnosis present

## 2019-07-22 DIAGNOSIS — Z794 Long term (current) use of insulin: Secondary | ICD-10-CM | POA: Insufficient documentation

## 2019-07-22 DIAGNOSIS — J441 Chronic obstructive pulmonary disease with (acute) exacerbation: Secondary | ICD-10-CM | POA: Diagnosis not present

## 2019-07-22 DIAGNOSIS — F17210 Nicotine dependence, cigarettes, uncomplicated: Secondary | ICD-10-CM | POA: Diagnosis not present

## 2019-07-22 LAB — BASIC METABOLIC PANEL
Anion gap: 9 (ref 5–15)
BUN: 14 mg/dL (ref 6–20)
CO2: 23 mmol/L (ref 22–32)
Calcium: 9.3 mg/dL (ref 8.9–10.3)
Chloride: 105 mmol/L (ref 98–111)
Creatinine, Ser: 0.88 mg/dL (ref 0.44–1.00)
GFR calc Af Amer: >60 mL/min (ref >60)
GFR calc non Af Amer: >60 mL/min (ref >60)
Glucose, Bld: 303 mg/dL — ABNORMAL HIGH (ref 70–99)
Potassium: 3.7 mmol/L (ref 3.5–5.1)
Sodium: 137 mmol/L (ref 135–145)

## 2019-07-22 LAB — CBC WITH DIFFERENTIAL/PLATELET
Abs Immature Granulocytes: 0.01 10*3/uL (ref 0.00–0.07)
Basophils Absolute: 0.1 10*3/uL (ref 0.0–0.1)
Basophils Relative: 1 %
Eosinophils Absolute: 0.3 10*3/uL (ref 0.0–0.5)
Eosinophils Relative: 4 %
HCT: 43.1 % (ref 36.0–46.0)
Hemoglobin: 14.8 g/dL (ref 12.0–15.0)
Immature Granulocytes: 0 %
Lymphocytes Relative: 44 %
Lymphs Abs: 3.8 10*3/uL (ref 0.7–4.0)
MCH: 31 pg (ref 26.0–34.0)
MCHC: 34.3 g/dL (ref 30.0–36.0)
MCV: 90.2 fL (ref 80.0–100.0)
Monocytes Absolute: 0.5 10*3/uL (ref 0.1–1.0)
Monocytes Relative: 6 %
Neutro Abs: 3.8 10*3/uL (ref 1.7–7.7)
Neutrophils Relative %: 45 %
Platelets: 208 10*3/uL (ref 150–400)
RBC: 4.78 MIL/uL (ref 3.87–5.11)
RDW: 13 % (ref 11.5–15.5)
WBC: 8.5 10*3/uL (ref 4.0–10.5)
nRBC: 0 % (ref 0.0–0.2)

## 2019-07-22 LAB — POC SARS CORONAVIRUS 2 AG -  ED: SARS Coronavirus 2 Ag: NEGATIVE

## 2019-07-22 MED ORDER — DOXYCYCLINE HYCLATE 100 MG PO CAPS: 100.0000 mg | ORAL_CAPSULE | Freq: Two times a day (BID) | ORAL | 0 refills | Status: DC

## 2019-07-22 MED ORDER — PREDNISONE 20 MG PO TABS: 60.0000 mg | ORAL_TABLET | Freq: Every day | ORAL | 0 refills | Status: DC

## 2019-07-22 MED ORDER — ALBUTEROL SULFATE HFA 108 (90 BASE) MCG/ACT IN AERS: 1.0000 | INHALATION_SPRAY | Freq: Four times a day (QID) | RESPIRATORY_TRACT | 1 refills | Status: DC | PRN

## 2019-07-22 NOTE — Discharge Instructions (Signed)
You were evaluated in the emergency department for shortness of breath and cough.  Your chest x-ray did not show any obvious pneumonia.  Your point-of-care Covid test was negative and your send out Covid test is pending at the time of discharge.  You should isolate until your Covid test is resulted.  We are treating you with steroids and antibiotics for COPD flare.  Please contact your primary care doctor for close follow-up.  Return to the emergency department if any concerns.

## 2019-07-22 NOTE — ED Provider Notes (Signed)
[facility] DEPT Provider Note   CSN: [id number] Arrival date & time: 07/22/19  1051     History Chief Complaint  Patient presents with  . Cough  . Shortness of Breath    [name] is a 59 y.o. female.  She is complaining of cough and shortness of breath for the last 3 days.  Associated with some body aches and diarrhea.  Her neighbor tested positive for Covid and do a lot of things together.  Feeling hot and cold.  The history is provided by the patient.  Cough Cough characteristics:  Non-productive Severity:  Moderate Onset quality:  Gradual Timing:  Intermittent Progression:  Unchanged Chronicity:  New Smoker: yes   Context: sick contacts   Relieved by:  None tried Worsened by:  Activity Ineffective treatments:  None tried Associated symptoms: chest pain (tightness), chills, fever, myalgias and shortness of breath   Associated symptoms: no headaches, no rash and no sore throat   Shortness of breath:    Severity:  Moderate   Onset quality:  Gradual   Timing:  Intermittent   Progression:  Unchanged Shortness of Breath Associated symptoms: chest pain (tightness), cough and fever   Associated symptoms: no abdominal pain, no headaches, no rash, no sore throat and no vomiting        Past Medical History:  Diagnosis Date  . Anxiety   . Arthritis   . COPD (chronic obstructive pulmonary disease) ([place])   . Depression   . Diabetes mellitus   . Diabetic neuropathy ([place])   . Diverticulosis   . Drug addiction ([place]) clean for 18 months   cocaine  . Fibromyalgia   . Full dentures   . GERD (gastroesophageal reflux disease)   . Hyperlipidemia   . Internal hemorrhoids   . PONV (postoperative nausea and vomiting)   . Rheumatoid arthritis(714.0)   . Wears glasses     Patient Active Problem List   Diagnosis Date Noted  . Diverticulitis 04/28/2015  . Dysphagia, unspecified(787.20) 03/14/2014  . Stroke ([place]) 03/13/2014  . Dizziness  03/13/2014  . Diabetes mellitus type 2, uncontrolled ([place]) 03/13/2014  . COPD (chronic obstructive pulmonary disease) ([place]) 03/13/2014  . Chest pain, atypical 03/13/2014  . Low back pain radiating to right lower extremity 03/02/2014    Past Surgical History:  Procedure Laterality Date  . APPENDECTOMY    . CATARACT EXTRACTION  2009   bilateral  . CESAREAN SECTION    . dental surgery  2011   all teeth removed  . EYE SURGERY  08,09   both cataracts  . HERNIA REPAIR     incisional  . KNEE ARTHROSCOPY  Right  . KNEE ARTHROSCOPY     lt and rt-2014  . rotatator cuff repair  2001   left  . TONSILLECTOMY    . TRIGGER FINGER RELEASE Right 12/20/2013   Procedure: RIGHT THUMB TRIGGER  RELEASE ;  Surgeon: Tennis Must, MD;  Location: [facility];  Service: Orthopedics;  Laterality: Right;  . TUBAL LIGATION  2002     OB History   No obstetric history on file.     Family History  Problem Relation Age of Onset  . Breast cancer Mother   . Breast cancer Maternal Aunt   . Colon cancer Maternal Grandfather   . Diabetes Paternal Grandmother   . Heart failure Maternal Grandmother        CHF    Social History   Tobacco Use  . Smoking  status: Current Some Day Smoker    Packs/day: 0.50    Types: Cigarettes  . Smokeless tobacco: Never Used  . Tobacco comment: Pt. smokes 6 a day, trying to quit  Substance Use Topics  . Alcohol use: No  . Drug use: No    Comment: former cocaine addiction    Home Medications Prior to Admission medications   Medication Sig Start Date End Date Taking? Authorizing Provider  albuterol (PROVENTIL HFA;VENTOLIN HFA) 108 (90 BASE) MCG/ACT inhaler Inhale 1 puff into the lungs every 6 (six) hours as needed for wheezing or shortness of breath. Reported on 11/09/2015    [provider]  ALPRAZolam Duanne Moron) 0.5 MG tablet Take 1 tablet (0.5 mg total) by mouth 2 (two) times daily as needed for anxiety. 06/14/19 06/13/20  Kathlee Nations, MD    aspirin EC 81 MG tablet Take 81 mg by mouth daily.    [provider]  Coenzyme Q10 100 MG capsule Take 100 mg by mouth daily.    [provider]  CONTOUR NEXT TEST test strip  03/03/18   [provider]  fluticasone Asencion Islam) 50 MCG/ACT nasal spray  06/01/18   [provider]  insulin lispro (HUMALOG) 100 UNIT/ML injection Inject into the skin every morning. Insulin pump-base rate-    [provider]  lisinopril (PRINIVIL,ZESTRIL) 5 MG tablet Take 5 mg by mouth daily.    [provider]  LIVALO 4 MG TABS Take 4 mg by mouth daily. Reported on 11/09/2015 02/10/15   [provider]  mirtazapine (REMERON) 30 MG tablet Take 1 tablet (30 mg total) by mouth at bedtime. 06/14/19 06/13/20  Kathlee Nations, MD    Allergies    Penicillins, Sulfa antibiotics, Statins, Codeine, and Keflet [cephalexin]  Review of Systems   Review of Systems  Constitutional: Positive for chills, fatigue and fever.  HENT: Negative for sore throat.   Eyes: Negative for visual disturbance.  Respiratory: Positive for cough and shortness of breath.   Cardiovascular: Positive for chest pain (tightness).  Gastrointestinal: Positive for diarrhea. Negative for abdominal pain and vomiting.  Genitourinary: Negative for dysuria.  Musculoskeletal: Positive for myalgias.  Skin: Negative for rash.  Neurological: Negative for headaches.    Physical Exam Updated Vital Signs BP (!) 114/53   Pulse 72   Temp 97.9 F (36.6 C) (Oral)   Resp (!) 22   Ht 4\' 11"  (1.499 m)   Wt 65.3 kg   SpO2 98%   BMI 29.08 kg/m   Physical Exam Vitals and nursing note reviewed.  Constitutional:      General: She is not in acute distress.    Appearance: She is well-developed.  HENT:     Head: Normocephalic and atraumatic.  Eyes:     Conjunctiva/sclera: Conjunctivae normal.  Cardiovascular:     Rate and Rhythm: Normal rate and regular rhythm.     Heart sounds: No murmur.   Pulmonary:     Effort: Pulmonary effort is normal. No respiratory distress.     Breath sounds: Normal breath sounds.  Abdominal:     Palpations: Abdomen is soft.     Tenderness: There is no abdominal tenderness.  Musculoskeletal:        General: Normal range of motion.     Cervical back: Neck supple.     Right lower leg: No tenderness. No edema.     Left lower leg: No tenderness. No edema.  Skin:    General: Skin is warm and dry.  Capillary Refill: Capillary refill takes less than 2 seconds.  Neurological:     General: No focal deficit present.     Mental Status: She is alert.     ED Results / Procedures / Treatments   Labs (all labs ordered are listed, but only abnormal results are displayed) Labs Reviewed  BASIC METABOLIC PANEL - Abnormal; Notable for the following components:      Result Value   Glucose, Bld 303 (*)    All other components within normal limits  NOVEL CORONAVIRUS, NAA (HOSP ORDER, SEND-OUT TO REF LAB; TAT 18-24 HRS)  CBC WITH DIFFERENTIAL/PLATELET  POC SARS CORONAVIRUS 2 AG -  ED    EKG EKG Interpretation  Date/Time:  Thursday July 22 2019 12:06:35 EST Ventricular Rate:  77 PR Interval:    QRS Duration: 80 QT Interval:  416 QTC Calculation: 471 R Axis:   80 Text Interpretation: Sinus rhythm Minimal ST elevation, inferior leads No significant change since prior 9/15 Confirmed by Aletta Edouard [phone]) on 07/22/2019 12:18:44 PM   Radiology DG Chest Port 1 View  Result Date: 07/22/2019 CLINICAL DATA:  58 year old female with history of cough and shortness of breath for the past 3 days. EXAM: PORTABLE CHEST 1 VIEW COMPARISON:  Chest x-ray 03/03/2019. FINDINGS: Lung volumes are normal. No consolidative airspace disease. No pleural effusions. No pneumothorax. No pulmonary nodule or mass noted. Pulmonary vasculature and the cardiomediastinal silhouette are within normal limits. Soft tissue anchors in the left proximal humerus incidentally noted.  IMPRESSION: No radiographic evidence of acute cardiopulmonary disease. Electronically Signed   By: Vinnie Langton M.D.   On: 07/22/2019 11:35    Procedures Procedures (including critical care time)  Medications Ordered in ED Medications - No data to display  ED Course  I have reviewed the triage vital signs and the nursing notes.  Pertinent labs & imaging results that were available during my care of the patient were reviewed by me and considered in my medical decision making (see chart for details).  Clinical Course as of Jul 21 1636  Thu Jul 22, 2019  1139 Differential diagnosis includes Covid, pneumonia, bronchitis, COPD flare, viral syndrome.   [MB]  1140 Chest x-ray interpreted by me as no gross infiltrates no pneumothorax.   [MB]    Clinical Course User Index [MB] Hayden Rasmussen, MD   MDM Rules/Calculators/A&P                     Aderyn Croyle was evaluated in Emergency Department on 07/22/2019 for the symptoms described in the history of present illness. She was evaluated in the context of the global COVID-19 pandemic, which necessitated consideration that the patient might be at risk for infection with the SARS-CoV-2 virus that causes COVID-19. Institutional protocols and algorithms that pertain to the evaluation of patients at risk for COVID-19 are in a state of rapid change based on information released by regulatory bodies including the CDC and federal and state organizations. These policies and algorithms were followed during the patient's care in the ED.   Final Clinical Impression(s) / ED Diagnoses Final diagnoses:  COPD exacerbation ([place])    Rx / DC Orders ED Discharge Orders         Ordered    predniSONE (DELTASONE) 20 MG tablet  Daily     07/22/19 1302    doxycycline (VIBRAMYCIN) 100 MG capsule  2 times daily     07/22/19 1302    albuterol (VENTOLIN HFA) 108 (90 Base)  MCG/ACT inhaler  Every 6 hours PRN     07/22/19 1303           Hayden Rasmussen, MD 07/22/19 [phone]

## 2019-07-22 NOTE — ED Triage Notes (Signed)
Pt BIBA from home.   Pt c/o SHOB, cough x3 days.  Pt reports neighbor recently tested positive for COVID.  Reports cough as nonproductive.

## 2019-07-23 LAB — NOVEL CORONAVIRUS, NAA (HOSP ORDER, SEND-OUT TO REF LAB; TAT 18-24 HRS): SARS-CoV-2, NAA: NOT DETECTED

## 2019-09-01 ENCOUNTER — Other Ambulatory Visit: Payer: Self-pay

## 2019-09-01 ENCOUNTER — Encounter (HOSPITAL_COMMUNITY): Payer: Self-pay | Admitting: Psychiatry

## 2019-09-01 ENCOUNTER — Ambulatory Visit (INDEPENDENT_AMBULATORY_CARE_PROVIDER_SITE_OTHER): Payer: Medicare Other | Admitting: Psychiatry

## 2019-09-01 DIAGNOSIS — F4310 Post-traumatic stress disorder, unspecified: Secondary | ICD-10-CM

## 2019-09-01 DIAGNOSIS — F411 Generalized anxiety disorder: Secondary | ICD-10-CM | POA: Diagnosis not present

## 2019-09-01 DIAGNOSIS — F410 Panic disorder [episodic paroxysmal anxiety] without agoraphobia: Secondary | ICD-10-CM

## 2019-09-01 MED ORDER — MIRTAZAPINE 30 MG PO TABS: 30.0000 mg | ORAL_TABLET | Freq: Every day | ORAL | 2 refills | Status: DC

## 2019-09-01 MED ORDER — ALPRAZOLAM 0.5 MG PO TABS: 0.5000 mg | ORAL_TABLET | Freq: Two times a day (BID) | ORAL | 2 refills | Status: DC | PRN
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
## 2019-09-07 ENCOUNTER — Ambulatory Visit (HOSPITAL_COMMUNITY): Payer: Medicare Other | Admitting: Psychiatry

## 2019-11-30 ENCOUNTER — Telehealth (INDEPENDENT_AMBULATORY_CARE_PROVIDER_SITE_OTHER): Payer: Medicare Other | Admitting: Psychiatry

## 2019-11-30 ENCOUNTER — Other Ambulatory Visit: Payer: Self-pay

## 2019-11-30 ENCOUNTER — Encounter (HOSPITAL_COMMUNITY): Payer: Self-pay | Admitting: Psychiatry

## 2019-11-30 DIAGNOSIS — F4310 Post-traumatic stress disorder, unspecified: Secondary | ICD-10-CM

## 2019-11-30 DIAGNOSIS — F410 Panic disorder [episodic paroxysmal anxiety] without agoraphobia: Secondary | ICD-10-CM | POA: Diagnosis not present

## 2019-11-30 DIAGNOSIS — F411 Generalized anxiety disorder: Secondary | ICD-10-CM | POA: Diagnosis not present

## 2019-11-30 MED ORDER — ALPRAZOLAM 0.5 MG PO TABS: 0.5000 mg | ORAL_TABLET | Freq: Two times a day (BID) | ORAL | 2 refills | Status: DC | PRN

## 2019-11-30 MED ORDER — MIRTAZAPINE 30 MG PO TABS: 30.0000 mg | ORAL_TABLET | Freq: Every day | ORAL | 2 refills | Status: DC
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
## 2020-02-02 ENCOUNTER — Other Ambulatory Visit: Payer: Self-pay

## 2020-02-02 DIAGNOSIS — I2510 Atherosclerotic heart disease of native coronary artery without angina pectoris: Secondary | ICD-10-CM

## 2020-02-13 ENCOUNTER — Other Ambulatory Visit (HOSPITAL_COMMUNITY): Payer: Self-pay | Admitting: Psychiatry

## 2020-02-13 DIAGNOSIS — F4310 Post-traumatic stress disorder, unspecified: Secondary | ICD-10-CM

## 2020-02-13 DIAGNOSIS — F411 Generalized anxiety disorder: Secondary | ICD-10-CM

## 2020-02-15 ENCOUNTER — Encounter (HOSPITAL_COMMUNITY): Payer: Medicare Other

## 2020-02-15 ENCOUNTER — Encounter: Payer: Medicare Other | Admitting: Vascular Surgery

## 2020-02-25 ENCOUNTER — Telehealth (INDEPENDENT_AMBULATORY_CARE_PROVIDER_SITE_OTHER): Payer: Medicare Other | Admitting: Psychiatry

## 2020-02-25 ENCOUNTER — Encounter (HOSPITAL_COMMUNITY): Payer: Self-pay | Admitting: Psychiatry

## 2020-02-25 ENCOUNTER — Other Ambulatory Visit: Payer: Self-pay

## 2020-02-25 DIAGNOSIS — F410 Panic disorder [episodic paroxysmal anxiety] without agoraphobia: Secondary | ICD-10-CM | POA: Diagnosis not present

## 2020-02-25 DIAGNOSIS — F4310 Post-traumatic stress disorder, unspecified: Secondary | ICD-10-CM | POA: Diagnosis not present

## 2020-02-25 DIAGNOSIS — F411 Generalized anxiety disorder: Secondary | ICD-10-CM

## 2020-02-25 MED ORDER — MIRTAZAPINE 15 MG PO TABS: 15.0000 mg | ORAL_TABLET | Freq: Every day | ORAL | 2 refills | Status: DC

## 2020-02-25 MED ORDER — ALPRAZOLAM 0.5 MG PO TABS: 0.5000 mg | ORAL_TABLET | Freq: Two times a day (BID) | ORAL | 2 refills | Status: DC | PRN
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
## 2020-03-02 ENCOUNTER — Telehealth (HOSPITAL_COMMUNITY): Payer: Medicare Other | Admitting: Psychiatry

## 2020-05-22 ENCOUNTER — Other Ambulatory Visit: Payer: Self-pay

## 2020-05-22 ENCOUNTER — Telehealth (INDEPENDENT_AMBULATORY_CARE_PROVIDER_SITE_OTHER): Payer: Medicare Other | Admitting: Psychiatry

## 2020-05-22 ENCOUNTER — Encounter (HOSPITAL_COMMUNITY): Payer: Self-pay | Admitting: Psychiatry

## 2020-05-22 DIAGNOSIS — F411 Generalized anxiety disorder: Secondary | ICD-10-CM

## 2020-05-22 DIAGNOSIS — F4310 Post-traumatic stress disorder, unspecified: Secondary | ICD-10-CM

## 2020-05-22 MED ORDER — MIRTAZAPINE 15 MG PO TABS: 15.0000 mg | ORAL_TABLET | Freq: Every day | ORAL | 2 refills | Status: DC

## 2020-05-22 MED ORDER — ALPRAZOLAM 0.5 MG PO TABS: 0.5000 mg | ORAL_TABLET | Freq: Two times a day (BID) | ORAL | 2 refills | Status: DC | PRN
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
## 2020-07-07 ENCOUNTER — Telehealth (HOSPITAL_COMMUNITY): Payer: Self-pay | Admitting: *Deleted

## 2020-07-07 NOTE — Telephone Encounter (Signed)
Pt called stating that her apartment building is being condemned and she is so anxious she's afraid she will have an MI. So pt asked for an increase of the amount of Xanax that she takes per day at least until she gets through this situation. Pt next appointment 08/18/20. Please reviw and advise. Thanks.

## 2020-07-10 NOTE — Telephone Encounter (Signed)
I have attempted to call pt several times, no answer, no VM, and home phone disconnected.

## 2020-07-10 NOTE — Telephone Encounter (Signed)
She can try increasing Remeron to 30 mg to help her anxiety. If agree than please call the new prescription.

## 2020-08-18 ENCOUNTER — Other Ambulatory Visit (HOSPITAL_COMMUNITY): Payer: Self-pay | Admitting: Psychiatry

## 2020-08-18 ENCOUNTER — Encounter (HOSPITAL_COMMUNITY): Payer: Self-pay | Admitting: Psychiatry

## 2020-08-18 ENCOUNTER — Telehealth (INDEPENDENT_AMBULATORY_CARE_PROVIDER_SITE_OTHER): Payer: Medicare Other | Admitting: Psychiatry

## 2020-08-18 ENCOUNTER — Other Ambulatory Visit: Payer: Self-pay

## 2020-08-18 DIAGNOSIS — F411 Generalized anxiety disorder: Secondary | ICD-10-CM | POA: Diagnosis not present

## 2020-08-18 DIAGNOSIS — F4310 Post-traumatic stress disorder, unspecified: Secondary | ICD-10-CM

## 2020-08-18 MED ORDER — MIRTAZAPINE 15 MG PO TABS: 15.0000 mg | ORAL_TABLET | Freq: Every day | ORAL | 2 refills | Status: DC

## 2020-08-18 MED ORDER — ALPRAZOLAM 0.5 MG PO TABS: 0.5000 mg | ORAL_TABLET | Freq: Two times a day (BID) | ORAL | 2 refills | Status: DC | PRN
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
## 2020-11-08 ENCOUNTER — Other Ambulatory Visit (HOSPITAL_COMMUNITY): Payer: Self-pay | Admitting: Psychiatry

## 2020-11-08 DIAGNOSIS — F411 Generalized anxiety disorder: Secondary | ICD-10-CM

## 2020-11-08 DIAGNOSIS — F4310 Post-traumatic stress disorder, unspecified: Secondary | ICD-10-CM

## 2020-11-16 ENCOUNTER — Encounter (HOSPITAL_COMMUNITY): Payer: Self-pay | Admitting: Psychiatry

## 2020-11-16 ENCOUNTER — Telehealth (INDEPENDENT_AMBULATORY_CARE_PROVIDER_SITE_OTHER): Payer: Medicare Other | Admitting: Psychiatry

## 2020-11-16 ENCOUNTER — Other Ambulatory Visit: Payer: Self-pay

## 2020-11-16 DIAGNOSIS — F411 Generalized anxiety disorder: Secondary | ICD-10-CM | POA: Diagnosis not present

## 2020-11-16 DIAGNOSIS — F4310 Post-traumatic stress disorder, unspecified: Secondary | ICD-10-CM | POA: Diagnosis not present

## 2020-11-16 MED ORDER — MIRTAZAPINE 15 MG PO TABS: 15.0000 mg | ORAL_TABLET | Freq: Every day | ORAL | 2 refills | Status: DC

## 2020-11-16 MED ORDER — ALPRAZOLAM 0.5 MG PO TABS: 0.5000 mg | ORAL_TABLET | Freq: Two times a day (BID) | ORAL | 2 refills | Status: DC | PRN
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
## 2020-11-24 ENCOUNTER — Other Ambulatory Visit: Payer: Self-pay | Admitting: Internal Medicine

## 2020-11-24 DIAGNOSIS — Z122 Encounter for screening for malignant neoplasm of respiratory organs: Secondary | ICD-10-CM

## 2020-12-18 ENCOUNTER — Ambulatory Visit
Admission: RE | Admit: 2020-12-18 | Discharge: 2020-12-18 | Disposition: A | Discharge status: Home / Self Care | Payer: Medicare Other | Source: Ambulatory Visit | Attending: Internal Medicine | Admitting: Internal Medicine

## 2020-12-18 ENCOUNTER — Other Ambulatory Visit: Payer: Self-pay

## 2020-12-18 DIAGNOSIS — Z122 Encounter for screening for malignant neoplasm of respiratory organs: Secondary | ICD-10-CM

## 2021-02-14 ENCOUNTER — Telehealth (INDEPENDENT_AMBULATORY_CARE_PROVIDER_SITE_OTHER): Payer: Medicare Other | Admitting: Psychiatry

## 2021-02-14 ENCOUNTER — Other Ambulatory Visit: Payer: Self-pay

## 2021-02-14 ENCOUNTER — Encounter (HOSPITAL_COMMUNITY): Payer: Self-pay | Admitting: Psychiatry

## 2021-02-14 DIAGNOSIS — F411 Generalized anxiety disorder: Secondary | ICD-10-CM

## 2021-02-14 DIAGNOSIS — F4310 Post-traumatic stress disorder, unspecified: Secondary | ICD-10-CM | POA: Diagnosis not present

## 2021-02-14 MED ORDER — MIRTAZAPINE 15 MG PO TABS: 15.0000 mg | ORAL_TABLET | Freq: Every day | ORAL | 2 refills | Status: DC

## 2021-02-14 MED ORDER — ALPRAZOLAM 0.5 MG PO TABS: 0.5000 mg | ORAL_TABLET | Freq: Two times a day (BID) | ORAL | 2 refills | Status: DC | PRN
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
## 2021-05-09 ENCOUNTER — Other Ambulatory Visit (HOSPITAL_COMMUNITY): Payer: Self-pay | Admitting: Psychiatry

## 2021-05-09 DIAGNOSIS — F411 Generalized anxiety disorder: Secondary | ICD-10-CM

## 2021-05-09 DIAGNOSIS — F4310 Post-traumatic stress disorder, unspecified: Secondary | ICD-10-CM

## 2021-05-17 ENCOUNTER — Telehealth (HOSPITAL_BASED_OUTPATIENT_CLINIC_OR_DEPARTMENT_OTHER): Payer: Medicare Other | Admitting: Psychiatry

## 2021-05-17 ENCOUNTER — Encounter (HOSPITAL_COMMUNITY): Payer: Self-pay | Admitting: Psychiatry

## 2021-05-17 ENCOUNTER — Other Ambulatory Visit: Payer: Self-pay

## 2021-05-17 DIAGNOSIS — F411 Generalized anxiety disorder: Secondary | ICD-10-CM

## 2021-05-17 DIAGNOSIS — F4310 Post-traumatic stress disorder, unspecified: Secondary | ICD-10-CM | POA: Diagnosis not present

## 2021-05-17 MED ORDER — MIRTAZAPINE 15 MG PO TABS: 15.0000 mg | ORAL_TABLET | Freq: Every day | ORAL | 2 refills | Status: DC

## 2021-05-17 MED ORDER — ALPRAZOLAM 0.5 MG PO TABS: 0.5000 mg | ORAL_TABLET | Freq: Two times a day (BID) | ORAL | 2 refills | Status: DC | PRN
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
## 2021-07-02 ENCOUNTER — Emergency Department (HOSPITAL_COMMUNITY): Payer: Medicare Other

## 2021-07-02 ENCOUNTER — Encounter (HOSPITAL_COMMUNITY): Payer: Self-pay | Admitting: Emergency Medicine

## 2021-07-02 ENCOUNTER — Emergency Department (HOSPITAL_COMMUNITY)
Admission: EM | Admit: 2021-07-02 | Discharge: 2021-07-02 | Disposition: A | Discharge status: Home / Self Care | Payer: Medicare Other | Attending: Emergency Medicine | Admitting: Emergency Medicine

## 2021-07-02 DIAGNOSIS — Z79899 Other long term (current) drug therapy: Secondary | ICD-10-CM | POA: Diagnosis not present

## 2021-07-02 DIAGNOSIS — H547 Unspecified visual loss: Secondary | ICD-10-CM | POA: Diagnosis present

## 2021-07-02 DIAGNOSIS — I10 Essential (primary) hypertension: Secondary | ICD-10-CM | POA: Insufficient documentation

## 2021-07-02 DIAGNOSIS — Z794 Long term (current) use of insulin: Secondary | ICD-10-CM | POA: Insufficient documentation

## 2021-07-02 DIAGNOSIS — Z7982 Long term (current) use of aspirin: Secondary | ICD-10-CM | POA: Insufficient documentation

## 2021-07-02 DIAGNOSIS — J441 Chronic obstructive pulmonary disease with (acute) exacerbation: Secondary | ICD-10-CM | POA: Diagnosis not present

## 2021-07-02 DIAGNOSIS — J029 Acute pharyngitis, unspecified: Secondary | ICD-10-CM | POA: Insufficient documentation

## 2021-07-02 DIAGNOSIS — R200 Anesthesia of skin: Secondary | ICD-10-CM | POA: Diagnosis not present

## 2021-07-02 DIAGNOSIS — Z7951 Long term (current) use of inhaled steroids: Secondary | ICD-10-CM | POA: Insufficient documentation

## 2021-07-02 DIAGNOSIS — R531 Weakness: Secondary | ICD-10-CM | POA: Insufficient documentation

## 2021-07-02 DIAGNOSIS — E119 Type 2 diabetes mellitus without complications: Secondary | ICD-10-CM | POA: Diagnosis not present

## 2021-07-02 DIAGNOSIS — H539 Unspecified visual disturbance: Secondary | ICD-10-CM | POA: Diagnosis not present

## 2021-07-02 LAB — COMPREHENSIVE METABOLIC PANEL
ALT: 25 U/L (ref 0–44)
AST: 26 U/L (ref 15–41)
Albumin: 4.3 g/dL (ref 3.5–5.0)
Alkaline Phosphatase: 55 U/L (ref 38–126)
Anion gap: 10 (ref 5–15)
BUN: 14 mg/dL (ref 8–23)
CO2: 23 mmol/L (ref 22–32)
Calcium: 9.5 mg/dL (ref 8.9–10.3)
Chloride: 106 mmol/L (ref 98–111)
Creatinine, Ser: 0.99 mg/dL (ref 0.44–1.00)
GFR, Estimated: >60 mL/min (ref >60)
Glucose, Bld: 197 mg/dL — ABNORMAL HIGH (ref 70–99)
Potassium: 4.4 mmol/L (ref 3.5–5.1)
Sodium: 139 mmol/L (ref 135–145)
Total Bilirubin: 0.8 mg/dL (ref 0.3–1.2)
Total Protein: 7.4 g/dL (ref 6.5–8.1)

## 2021-07-02 LAB — DIFFERENTIAL
Abs Immature Granulocytes: 0.03 10*3/uL (ref 0.00–0.07)
Basophils Absolute: 0.1 10*3/uL (ref 0.0–0.1)
Basophils Relative: 1 %
Eosinophils Absolute: 0.2 10*3/uL (ref 0.0–0.5)
Eosinophils Relative: 2 %
Immature Granulocytes: 0 %
Lymphocytes Relative: 35 %
Lymphs Abs: 3.6 10*3/uL (ref 0.7–4.0)
Monocytes Absolute: 0.6 10*3/uL (ref 0.1–1.0)
Monocytes Relative: 6 %
Neutro Abs: 5.8 10*3/uL (ref 1.7–7.7)
Neutrophils Relative %: 56 %

## 2021-07-02 LAB — CBC
HCT: 44.3 % (ref 36.0–46.0)
Hemoglobin: 14.3 g/dL (ref 12.0–15.0)
MCH: 30 pg (ref 26.0–34.0)
MCHC: 32.3 g/dL (ref 30.0–36.0)
MCV: 93.1 fL (ref 80.0–100.0)
Platelets: 207 10*3/uL (ref 150–400)
RBC: 4.76 MIL/uL (ref 3.87–5.11)
RDW: 13.9 % (ref 11.5–15.5)
WBC: 10.3 10*3/uL (ref 4.0–10.5)
nRBC: 0 % (ref 0.0–0.2)

## 2021-07-02 LAB — APTT: aPTT: 39 seconds — ABNORMAL HIGH (ref 24–36)

## 2021-07-02 LAB — I-STAT CHEM 8, ED
BUN: 16 mg/dL (ref 8–23)
Calcium, Ion: 1.16 mmol/L (ref 1.15–1.40)
Chloride: 107 mmol/L (ref 98–111)
Creatinine, Ser: 0.9 mg/dL (ref 0.44–1.00)
Glucose, Bld: 186 mg/dL — ABNORMAL HIGH (ref 70–99)
HCT: 44 % (ref 36.0–46.0)
Hemoglobin: 15 g/dL (ref 12.0–15.0)
Potassium: 4.3 mmol/L (ref 3.5–5.1)
Sodium: 140 mmol/L (ref 135–145)
TCO2: 24 mmol/L (ref 22–32)

## 2021-07-02 LAB — PROTIME-INR
INR: 0.9 (ref 0.8–1.2)
Prothrombin Time: 12.3 seconds (ref 11.4–15.2)

## 2021-07-02 MED ORDER — ALPRAZOLAM 0.25 MG PO TABS
0.5000 mg | ORAL_TABLET | Freq: Once | ORAL | Status: DC
  Filled 2021-07-02: qty 2

## 2021-07-02 MED ORDER — SODIUM CHLORIDE 0.9% FLUSH
3.0000 mL | Freq: Once | INTRAVENOUS | Status: AC
  Administered 2021-07-02: 3 mL via INTRAVENOUS

## 2021-07-02 MED ORDER — TETRACAINE HCL 0.5 % OP SOLN: 2.0000 [drp] | Freq: Once | OPHTHALMIC | Status: DC

## 2021-07-02 MED ORDER — ACETAMINOPHEN 325 MG PO TABS
650.0000 mg | ORAL_TABLET | Freq: Once | ORAL | Status: DC
  Filled 2021-07-02: qty 2

## 2021-07-02 MED ORDER — IOHEXOL 350 MG/ML SOLN
75.0000 mL | Freq: Once | INTRAVENOUS | Status: AC | PRN
  Administered 2021-07-02: 75 mL via INTRAVENOUS

## 2021-07-02 NOTE — ED Notes (Signed)
Patient verbalizes understanding of discharge instructions. Opportunity for questioning and answers were provided. Armband removed by staff, pt discharged from ED ambulatory.   

## 2021-07-02 NOTE — Discharge Instructions (Addendum)
We recommend that you stay to have your eye pressure checked and be seen by an ophthalmologist.  Since you would like to leave, we recommend that you schedule an appointment with your ophthalmologist as soon as possible.  If you are unable to get in with your ophthalmologist you may call the number provided in your discharge paperwork to schedule an appointment.

## 2021-07-02 NOTE — ED Notes (Signed)
Pt in MRI.

## 2021-07-02 NOTE — ED Triage Notes (Addendum)
Patient complains of a 5/10 headache that started two days ago, and severe dark blurred vision in the left eye that was present on waking this morning. Patient alert, oriented, and in no apparent distress at this time. Denies nausea and vomiting. Patient states sensation to touch is diminished in left arm, left arm has some resistance to gravity but does not fall to resting within ten seconds, no drift in either leg, no dysarthria, no facial droop.

## 2021-07-02 NOTE — ED Notes (Addendum)
This nurse walked up to assess pt and pt is trying to take out her IV and saying that she wants leave. MD made aware

## 2021-07-02 NOTE — ED Provider Notes (Signed)
[facility] EMERGENCY DEPARTMENT Provider Note   CSN: [id number] Arrival date & time: 07/02/21  1155     History  Chief Complaint  Patient presents with   Visual Field Change    [name] is a 61 y.o. female with past medical history significant for anxiety, COPD, DM, HLD, GERD, HTN, depression who presents with left-sided numbness/weakness and left eye vision loss.  She says about 3 days ago she says her left leg started to feel heavy and this was associated with some numbness.  This progressively got worse over the past 3 days and today she started to have left eye vision loss.  She describes this as like a black spiderweb going over her vision.  This is associated with a pressure behind her eye.  She has also been having more frequent headaches.  The headache is left-sided and wraps around her head posteriorly.  She reports a history of TIAs in the past and states that she had similar symptoms.  She denies any fevers, chills, nausea, vomiting, or cough worse than baseline.  She has been congested and had a sore throat for the past week.   Home Medications Prior to Admission medications   Medication Sig Start Date End Date Taking? Authorizing Provider  albuterol (VENTOLIN HFA) 108 (90 Base) MCG/ACT inhaler Inhale 1 puff into the lungs every 6 (six) hours as needed for wheezing or shortness of breath. 07/22/19   Hayden Rasmussen, MD  ALPRAZolam Duanne Moron) 0.5 MG tablet Take 1 tablet (0.5 mg total) by mouth 2 (two) times daily as needed for anxiety. 05/17/21 05/17/22  Kathlee Nations, MD  aspirin EC 81 MG tablet Take 81 mg by mouth daily.    [provider]  Coenzyme Q10 100 MG capsule Take 100 mg by mouth daily.    [provider]  CONTOUR NEXT TEST test strip  03/03/18   [provider]  fluticasone Asencion Islam) 50 MCG/ACT nasal spray  06/01/18   [provider]  HYDROcodone-acetaminophen (NORCO/VICODIN) 5-325 MG tablet Take 1 tablet by  mouth 2 (two) times daily as needed. 11/22/19   Bujanowski, Melissa, PA-C  insulin glargine (LANTUS SOLOSTAR) 100 UNIT/ML Solostar Pen Lantus Solostar U-100 Insulin 100 unit/mL (3 mL) subcutaneous pen    Bujanowski, Melissa, PA-C  insulin lispro (HUMALOG) 100 UNIT/ML injection Inject into the skin every morning. Insulin pump-base rate-    [provider]  LIVALO 4 MG TABS Take 4 mg by mouth daily. Reported on 11/09/2015 02/10/15   [provider]  losartan (COZAAR) 25 MG tablet Take 25 mg by mouth daily. 10/27/19   Bujanowski, Melissa, PA-C  mirtazapine (REMERON) 15 MG tablet Take 1 tablet (15 mg total) by mouth at bedtime. 05/17/21 05/17/22  Kathlee Nations, MD      Allergies    Penicillins, Sulfa antibiotics, Statins, Codeine, and Keflet [cephalexin]    Review of Systems   Review of Systems  Constitutional:  Negative for chills and fever.  HENT:  Positive for congestion and sore throat.   Eyes:  Positive for pain and visual disturbance. Negative for photophobia, discharge and redness.  Respiratory:  Negative for cough and shortness of breath.   Cardiovascular:  Negative for chest pain and palpitations.  Gastrointestinal:  Negative for abdominal pain, nausea and vomiting.  Genitourinary:  Negative for dysuria.  Musculoskeletal:  Negative for myalgias.  Skin:  Negative for color change and rash.  Neurological:  Positive for weakness, numbness and headaches. Negative for seizures  and syncope.  All other systems reviewed and are negative.  Physical Exam Updated Vital Signs BP (!) 152/70    Pulse 80    Temp 97.8 F (36.6 C) (Oral)    Resp 20    SpO2 94%  Physical Exam Vitals and nursing note reviewed.  Constitutional:      General: She is not in acute distress.    Appearance: She is well-developed. She is ill-appearing (chronically ill appearing).  HENT:     Head: Normocephalic and atraumatic.     Right Ear: External ear normal.     Left Ear: External ear normal.      Nose: Nose normal.     Mouth/Throat:     Pharynx: Oropharynx is clear.  Eyes:     Extraocular Movements: Extraocular movements intact.     Conjunctiva/sclera: Conjunctivae normal.     Pupils: Pupils are equal, round, and reactive to light.  Cardiovascular:     Rate and Rhythm: Normal rate and regular rhythm.     Heart sounds: No murmur heard. Pulmonary:     Effort: Pulmonary effort is normal. No respiratory distress.     Breath sounds: Normal breath sounds.  Abdominal:     Palpations: Abdomen is soft.     Tenderness: There is no abdominal tenderness.  Musculoskeletal:        General: No swelling.     Cervical back: Neck supple.  Skin:    General: Skin is warm and dry.     Capillary Refill: Capillary refill takes less than 2 seconds.  Neurological:     Mental Status: She is alert.     GCS: GCS eye subscore is 4. GCS verbal subscore is 5. GCS motor subscore is 6.     Cranial Nerves: No dysarthria or facial asymmetry.     Motor: Weakness (Mild weakness of the left upper extremity likely attributable to significant surgical history in the left shoulder; no other focal weakness appreciated) present.     Coordination: Coordination is intact.     Gait: Gait is intact.     Comments: Patient reports decreased sensation to light touch in the left V1 distribution.  No facial asymmetry or dysarthria.  On visual field testing, the patient appears to have tunneling of her vision in the left eye with preserved central vision and left temporal and nasal partial visual field deficits.  Psychiatric:        Mood and Affect: Mood normal.    ED Results / Procedures / Treatments   Labs (all labs ordered are listed, but only abnormal results are displayed) Labs Reviewed  APTT - Abnormal; Notable for the following components:      Result Value   aPTT 39 (*)    All other components within normal limits  COMPREHENSIVE METABOLIC PANEL - Abnormal; Notable for the following components:   Glucose, Bld  197 (*)    All other components within normal limits  I-STAT CHEM 8, ED - Abnormal; Notable for the following components:   Glucose, Bld 186 (*)    All other components within normal limits  PROTIME-INR  CBC  DIFFERENTIAL  CBG MONITORING, ED    EKG EKG Interpretation  Date/Time:  Monday July 02 2021 12:27:41 EST Ventricular Rate:  81 PR Interval:  146 QRS Duration: 68 QT Interval:  414 QTC Calculation: 480 R Axis:   58 Text Interpretation: Normal sinus rhythm Nonspecific T wave abnormality  similar to Jan 2021 Confirmed by Sherwood Gambler [phone]) on 07/02/2021  9:02:56 PM  Radiology CT ANGIO HEAD NECK W WO CM  Result Date: 07/02/2021 CLINICAL DATA:  Monocular vision loss.  Left-sided deficits. EXAM: CT ANGIOGRAPHY HEAD AND NECK TECHNIQUE: Multidetector CT imaging of the head and neck was performed using the standard protocol during bolus administration of intravenous contrast. Multiplanar CT image reconstructions and MIPs were obtained to evaluate the vascular anatomy. Carotid stenosis measurements (when applicable) are obtained utilizing NASCET criteria, using the distal internal carotid diameter as the denominator. CONTRAST:  20mL OMNIPAQUE IOHEXOL 350 MG/ML SOLN COMPARISON:  Head MRA 04/21/2019. Neck CT 09/05/2014. Head MRI 03/30/2014. FINDINGS: CT HEAD FINDINGS Brain: There is no evidence of an acute infarct, intracranial hemorrhage, mass, midline shift, or extra-axial fluid collection. The ventricles and sulci are normal. Vascular: Calcified atherosclerosis at the skull base. Skull: No fracture or suspicious osseous lesion. Sinuses: Mucosal thickening and fluid in the right maxillary sinus. Clear mastoid air cells. Orbits: More fully evaluated on separate dedicated CT of the orbits. Review of the MIP images confirms the above findings CTA NECK FINDINGS Aortic arch: Normal variant aortic arch branching pattern with common origin of the brachiocephalic and left common carotid arteries.  Widely patent arch vessel origins. Right carotid system: Patent without evidence of stenosis or dissection. Left carotid system: Patent with a small amount of calcified and soft plaque at the carotid bifurcation. No evidence of a significant stenosis or dissection. Vertebral arteries: Patent without evidence of a significant stenosis or dissection although portions of the left V1 and V2 segments are suboptimally evaluated due to adjacent venous contrast. Mildly dominant left vertebral artery. Skeleton: No acute osseous abnormality or suspicious osseous lesion. Edentulous. Other neck: No evidence of cervical lymphadenopathy or mass. Upper chest: No apical lung consolidation or mass. Review of the MIP images confirms the above findings CTA HEAD FINDINGS Anterior circulation: The internal carotid arteries are patent from skull base to carotid termini with mild atherosclerosis bilaterally not resulting in a significant stenosis. A 3 mm aneurysm projecting superiorly from the left ICA terminus is unchanged. ACAs and MCAs are patent without evidence of a proximal branch occlusion or significant proximal stenosis. Posterior circulation: The intracranial vertebral arteries are patent to the basilar with mild irregularity but no significant stenosis. Patent PICA and SCA origins are seen bilaterally. The basilar artery is widely patent. There is a fetal origin of the left PCA. Both PCAs are patent without evidence of a significant proximal stenosis. No aneurysm is identified. Venous sinuses: Patent. Anatomic variants: Fetal left PCA. Review of the MIP images confirms the above findings IMPRESSION: 1. No evidence of acute intracranial abnormality. 2. Mild atherosclerosis in the head and neck without a large vessel occlusion or significant proximal stenosis. 3. Unchanged 3 mm left ICA terminus aneurysm. Electronically Signed   By: Logan Bores M.D.   On: 07/02/2021 13:54   MR BRAIN WO CONTRAST  Result Date:  07/02/2021 CLINICAL DATA:  Initial evaluation for neuro deficit, stroke suspected. EXAM: MRI HEAD WITHOUT CONTRAST TECHNIQUE: Multiplanar, multiecho pulse sequences of the brain and surrounding structures were obtained without intravenous contrast. COMPARISON:  CTA from earlier the same day. FINDINGS: Brain: Cerebral volume within normal limits. No significant cerebral white matter disease for age. No abnormal foci of restricted diffusion to suggest acute or subacute ischemia. Gray-white matter differentiation maintained. No encephalomalacia to suggest chronic cortical infarction. No evidence for acute or chronic intracranial hemorrhage. No mass lesion, midline shift or mass effect. No hydrocephalus or extra-axial fluid collection. Pituitary gland suprasellar region  normal. Midline structures intact. Vascular: Major intracranial vascular flow voids are maintained. Skull and upper cervical spine: Craniocervical junction within normal limits. Bone marrow signal intensity normal. No scalp soft tissue abnormality. Sinuses/Orbits: Patient status post bilateral ocular lens replacement. Globes and orbital soft tissues demonstrate no acute finding. Right maxillary sinus is somewhat hypoplastic with moderate mucosal thickening, chronic in appearance. Additional mild mucosal thickening noted within the right sphenoid sinus as well. No mastoid effusion. Inner ear structures grossly normal. Other: None. IMPRESSION: Negative brain MRI for age. No acute intracranial infarct or other abnormality. Electronically Signed   By: Jeannine Boga M.D.   On: 07/02/2021 20:49   CT Orbits W Contrast  Result Date: 07/02/2021 CLINICAL DATA:  Monocular vision loss. EXAM: CT ORBITS WITH CONTRAST TECHNIQUE: Multidetector CT images was performed according to the standard protocol following intravenous contrast administration. CONTRAST:  110mL OMNIPAQUE IOHEXOL 350 MG/ML SOLN COMPARISON:  No pertinent prior exam. FINDINGS: Orbits: Bilateral  cataract extraction. No evidence of an orbital mass or inflammation. Unremarkable lacrimal glands and extraocular muscles. Visible paranasal sinuses: Small right maxillary sinus with mild mucosal thickening and fluid. Clear mastoid air cells. Soft tissues: Unremarkable. Osseous: No acute fracture or destructive osseous lesion. Limited intracranial: More fully evaluated on the separate head and neck CTA. IMPRESSION: No evidence of acute abnormality involving the orbits. Electronically Signed   By: Logan Bores M.D.   On: 07/02/2021 13:58    Procedures Procedures   Medications Ordered in ED Medications  sodium chloride flush (NS) 0.9 % injection 3 mL (3 mLs Intravenous Given 07/02/21 1711)  iohexol (OMNIPAQUE) 350 MG/ML injection 75 mL (75 mLs Intravenous Contrast Given 07/02/21 1332)    ED Course/ Medical Decision Making/ A&P                            Patient presents with left eye vision loss and subjective weakness as described in HPI above.  Physical exam significant for the findings above.  Notably, patient has preserved central vision in the left eye with peripheral vision loss and reports decreased sensation in the left V1 distribution.  The patient has some mild weakness of the left upper extremity which I suspect is due to her extensive shoulder surgery history.  I do not appreciate any other weakness on exam.  The patient states that she feels like her left leg is heavier.  She denies difference in sensation to light touch between the left and right upper and lower extremities.  CBC and CMP reviewed by myself and are unremarkable without significant abnormalities.  CTA of the head and neck redemonstrates known left ICA terminus aneurysm but is otherwise negative for acute intracranial abnormality, LVO, or significant proximal stenosis.  CT orbits reviewed by myself also negative for any abnormality involving the orbits.  I discussed the patient's symptoms with neurologist on-call who  recommended MRI brain.  If MRI brain is negative, neurology recommends engaging ophthalmology for vision loss.  Of note, I reviewed the patient's prior admission from 2015 when she was admitted with strokelike symptoms.  At that time MRI was negative for any stroke and neurology believed that she suffered from conversion disorder.  Patient's MRI reviewed by myself is negative for stroke.  I discussed the results of the MRI with the patient and recommended that she stay for ophthalmology evaluation.  The patient expressed her desire to be discharged.  I explained that if she leaves without ophthalmology evaluation her  vision loss may get worse and/or become permanent.  The patient says that she understands this and would prefer to follow-up with her diabetic retina specialist outpatient.  I have also provided contact information for the ophthalmologist currently on-call for the patient to schedule an appointment with her should her retina specialist not be available to see her soon.  I emphasized the importance of ophthalmology follow-up ASAP.  The patient was then discharged.  Final Clinical Impression(s) / ED Diagnoses Final diagnoses:  Visual disturbance    Rx / DC Orders ED Discharge Orders     None         Kadin Bera, Amalia Hailey, MD 07/03/21 1347    Sherwood Gambler, MD 07/05/21 1920

## 2021-07-02 NOTE — ED Notes (Signed)
Pt requesting to leave - MD at bedside - pt sitting on edge of bed pulling at IV

## 2021-07-02 NOTE — ED Provider Notes (Signed)
Emergency Medicine Provider Triage Evaluation Note  [name] , a 61 y.o. female  was evaluated in triage.  Pt complains of left-sided numbness/weakness and left eye vision loss over the last 2 to 3 days.  Has been constant and worsening.  Has a history of TIAs in the past.  Reports associated headache which she rates 5/10 in severity.  She describes vision loss as a blurred vision with some double vision when looking laterally.  Denies any nausea, vomiting, fever, chills, cough, congestion.  Review of Systems  Positive:  Negative: See above  Physical Exam  BP (!) 176/84 (BP Location: Left Arm)    Pulse 89    Temp 98.1 F (36.7 C) (Oral)    Resp 14    SpO2 100%  Gen:   Awake, no distress   Resp:  Normal effort  MSK:   Moves extremities without difficulty  Other:  Cranial nerves II through XII are intact.  Minimal dysmetria on finger-nose.  4/5 grip strength on the left.  Decreased subjective sensation on the left.  Strength is symmetrical in the lower extremities.  Medical Decision Making  Medically screening exam initiated at 12:40 PM.  Appropriate orders placed.  [name] was informed that the remainder of the evaluation will be completed by another provider, this initial triage assessment does not replace that evaluation, and the importance of remaining in the ED until their evaluation is complete.  Stroke work-up initiated.   Myna Bright [place], PA-C 07/02/21 1251    Godfrey Pick, MD 07/04/21 [phone]

## 2021-07-03 ENCOUNTER — Other Ambulatory Visit: Payer: Self-pay

## 2021-07-03 ENCOUNTER — Encounter (INDEPENDENT_AMBULATORY_CARE_PROVIDER_SITE_OTHER): Payer: Commercial Managed Care - HMO | Admitting: Ophthalmology

## 2021-07-03 DIAGNOSIS — H35033 Hypertensive retinopathy, bilateral: Secondary | ICD-10-CM

## 2021-07-03 DIAGNOSIS — H4312 Vitreous hemorrhage, left eye: Secondary | ICD-10-CM | POA: Diagnosis not present

## 2021-07-03 DIAGNOSIS — E113211 Type 2 diabetes mellitus with mild nonproliferative diabetic retinopathy with macular edema, right eye: Secondary | ICD-10-CM | POA: Diagnosis not present

## 2021-07-03 DIAGNOSIS — H43813 Vitreous degeneration, bilateral: Secondary | ICD-10-CM

## 2021-07-03 DIAGNOSIS — I10 Essential (primary) hypertension: Secondary | ICD-10-CM | POA: Diagnosis not present

## 2021-07-03 DIAGNOSIS — H2513 Age-related nuclear cataract, bilateral: Secondary | ICD-10-CM

## 2021-07-03 DIAGNOSIS — E113592 Type 2 diabetes mellitus with proliferative diabetic retinopathy without macular edema, left eye: Secondary | ICD-10-CM | POA: Diagnosis not present

## 2021-07-04 ENCOUNTER — Telehealth (HOSPITAL_COMMUNITY): Payer: Self-pay

## 2021-07-04 NOTE — Telephone Encounter (Signed)
Patient called and requested to have her Alprazolam (Xanax) 0.5mg  increased for the next couple of months. She stated that she's having medical issues and getting eye injections. She stated that she's panicking, stressing out, and that she's overwhelmed. Patient has a follow up appointment scheduled for 08/08/21. Please review and advise. Thank you

## 2021-07-04 NOTE — Telephone Encounter (Signed)
She is already taking Xanax twice a day.  She should see a therapist to help her anxiety.  She is taking mirtazapine 15 mg if she agree we can try 30 mg to help her depression and anxiety.

## 2021-07-05 NOTE — Telephone Encounter (Signed)
Writer called patient to relay message. She stated that she doesn't want to change anything with her medication and she was not interested in a therapist at this time. She stated that she's going to deal with her stress/anxiety the way she has been and get through the eye injections

## 2021-07-06 ENCOUNTER — Encounter (INDEPENDENT_AMBULATORY_CARE_PROVIDER_SITE_OTHER): Payer: Commercial Managed Care - HMO | Admitting: Ophthalmology

## 2021-07-06 ENCOUNTER — Other Ambulatory Visit: Payer: Self-pay

## 2021-07-06 DIAGNOSIS — E113592 Type 2 diabetes mellitus with proliferative diabetic retinopathy without macular edema, left eye: Secondary | ICD-10-CM

## 2021-08-06 ENCOUNTER — Encounter (INDEPENDENT_AMBULATORY_CARE_PROVIDER_SITE_OTHER): Payer: Medicare Other | Admitting: Ophthalmology

## 2021-08-06 ENCOUNTER — Other Ambulatory Visit: Payer: Self-pay

## 2021-08-06 DIAGNOSIS — E113291 Type 2 diabetes mellitus with mild nonproliferative diabetic retinopathy without macular edema, right eye: Secondary | ICD-10-CM

## 2021-08-06 DIAGNOSIS — I10 Essential (primary) hypertension: Secondary | ICD-10-CM | POA: Diagnosis not present

## 2021-08-06 DIAGNOSIS — E113512 Type 2 diabetes mellitus with proliferative diabetic retinopathy with macular edema, left eye: Secondary | ICD-10-CM

## 2021-08-06 DIAGNOSIS — H43813 Vitreous degeneration, bilateral: Secondary | ICD-10-CM

## 2021-08-06 DIAGNOSIS — H35033 Hypertensive retinopathy, bilateral: Secondary | ICD-10-CM

## 2021-08-06 DIAGNOSIS — H4312 Vitreous hemorrhage, left eye: Secondary | ICD-10-CM | POA: Diagnosis not present

## 2021-08-08 ENCOUNTER — Encounter (HOSPITAL_COMMUNITY): Payer: Self-pay | Admitting: Psychiatry

## 2021-08-08 ENCOUNTER — Telehealth (HOSPITAL_COMMUNITY): Payer: Medicare Other | Admitting: Psychiatry

## 2021-08-08 ENCOUNTER — Telehealth (HOSPITAL_BASED_OUTPATIENT_CLINIC_OR_DEPARTMENT_OTHER): Payer: Medicare Other | Admitting: Psychiatry

## 2021-08-08 ENCOUNTER — Other Ambulatory Visit: Payer: Self-pay

## 2021-08-08 DIAGNOSIS — F411 Generalized anxiety disorder: Secondary | ICD-10-CM

## 2021-08-08 DIAGNOSIS — F4310 Post-traumatic stress disorder, unspecified: Secondary | ICD-10-CM | POA: Diagnosis not present

## 2021-08-08 MED ORDER — ALPRAZOLAM 0.5 MG PO TABS: 0.5000 mg | ORAL_TABLET | Freq: Two times a day (BID) | ORAL | 2 refills | Status: DC | PRN

## 2021-08-08 MED ORDER — MIRTAZAPINE 30 MG PO TABS: 30.0000 mg | ORAL_TABLET | Freq: Every day | ORAL | 2 refills | Status: DC
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
## 2021-08-20 ENCOUNTER — Other Ambulatory Visit: Payer: Self-pay

## 2021-08-20 ENCOUNTER — Encounter (INDEPENDENT_AMBULATORY_CARE_PROVIDER_SITE_OTHER): Payer: Medicare Other | Admitting: Ophthalmology

## 2021-08-20 DIAGNOSIS — E113512 Type 2 diabetes mellitus with proliferative diabetic retinopathy with macular edema, left eye: Secondary | ICD-10-CM

## 2021-09-03 ENCOUNTER — Encounter (INDEPENDENT_AMBULATORY_CARE_PROVIDER_SITE_OTHER): Payer: Medicare Other | Admitting: Ophthalmology

## 2021-09-03 ENCOUNTER — Other Ambulatory Visit: Payer: Self-pay

## 2021-09-03 DIAGNOSIS — H43813 Vitreous degeneration, bilateral: Secondary | ICD-10-CM | POA: Diagnosis not present

## 2021-09-03 DIAGNOSIS — E113512 Type 2 diabetes mellitus with proliferative diabetic retinopathy with macular edema, left eye: Secondary | ICD-10-CM

## 2021-09-03 DIAGNOSIS — I10 Essential (primary) hypertension: Secondary | ICD-10-CM

## 2021-09-03 DIAGNOSIS — H35033 Hypertensive retinopathy, bilateral: Secondary | ICD-10-CM | POA: Diagnosis not present

## 2021-09-03 DIAGNOSIS — E113291 Type 2 diabetes mellitus with mild nonproliferative diabetic retinopathy without macular edema, right eye: Secondary | ICD-10-CM

## 2021-10-02 ENCOUNTER — Encounter (INDEPENDENT_AMBULATORY_CARE_PROVIDER_SITE_OTHER): Payer: Medicare Other | Admitting: Ophthalmology

## 2021-10-09 DIAGNOSIS — Z79899 Other long term (current) drug therapy: Secondary | ICD-10-CM | POA: Diagnosis not present

## 2021-10-09 DIAGNOSIS — G894 Chronic pain syndrome: Secondary | ICD-10-CM | POA: Diagnosis not present

## 2021-10-09 DIAGNOSIS — R030 Elevated blood-pressure reading, without diagnosis of hypertension: Secondary | ICD-10-CM | POA: Diagnosis not present

## 2021-10-09 DIAGNOSIS — I10 Essential (primary) hypertension: Secondary | ICD-10-CM | POA: Diagnosis not present

## 2021-10-09 DIAGNOSIS — E1165 Type 2 diabetes mellitus with hyperglycemia: Secondary | ICD-10-CM | POA: Diagnosis not present

## 2021-10-10 DIAGNOSIS — I10 Essential (primary) hypertension: Secondary | ICD-10-CM | POA: Diagnosis not present

## 2021-10-10 DIAGNOSIS — J449 Chronic obstructive pulmonary disease, unspecified: Secondary | ICD-10-CM | POA: Diagnosis not present

## 2021-10-10 DIAGNOSIS — J438 Other emphysema: Secondary | ICD-10-CM | POA: Diagnosis not present

## 2021-10-10 DIAGNOSIS — I700 Atherosclerosis of aorta: Secondary | ICD-10-CM | POA: Diagnosis not present

## 2021-10-11 DIAGNOSIS — M810 Age-related osteoporosis without current pathological fracture: Secondary | ICD-10-CM | POA: Diagnosis not present

## 2021-10-11 DIAGNOSIS — N1831 Chronic kidney disease, stage 3a: Secondary | ICD-10-CM | POA: Diagnosis not present

## 2021-10-11 DIAGNOSIS — Z720 Tobacco use: Secondary | ICD-10-CM | POA: Diagnosis not present

## 2021-10-11 DIAGNOSIS — I10 Essential (primary) hypertension: Secondary | ICD-10-CM | POA: Diagnosis not present

## 2021-10-11 DIAGNOSIS — E1042 Type 1 diabetes mellitus with diabetic polyneuropathy: Secondary | ICD-10-CM | POA: Diagnosis not present

## 2021-10-11 DIAGNOSIS — K219 Gastro-esophageal reflux disease without esophagitis: Secondary | ICD-10-CM | POA: Diagnosis not present

## 2021-10-11 DIAGNOSIS — I7090 Unspecified atherosclerosis: Secondary | ICD-10-CM | POA: Diagnosis not present

## 2021-10-11 DIAGNOSIS — E1059 Type 1 diabetes mellitus with other circulatory complications: Secondary | ICD-10-CM | POA: Diagnosis not present

## 2021-10-11 DIAGNOSIS — Z794 Long term (current) use of insulin: Secondary | ICD-10-CM | POA: Diagnosis not present

## 2021-10-15 ENCOUNTER — Encounter (INDEPENDENT_AMBULATORY_CARE_PROVIDER_SITE_OTHER): Payer: Medicare Other | Admitting: Ophthalmology

## 2021-10-15 DIAGNOSIS — H4312 Vitreous hemorrhage, left eye: Secondary | ICD-10-CM

## 2021-10-15 DIAGNOSIS — H35051 Retinal neovascularization, unspecified, right eye: Secondary | ICD-10-CM

## 2021-10-15 DIAGNOSIS — E113593 Type 2 diabetes mellitus with proliferative diabetic retinopathy without macular edema, bilateral: Secondary | ICD-10-CM | POA: Diagnosis not present

## 2021-10-15 DIAGNOSIS — I10 Essential (primary) hypertension: Secondary | ICD-10-CM | POA: Diagnosis not present

## 2021-10-15 DIAGNOSIS — H35033 Hypertensive retinopathy, bilateral: Secondary | ICD-10-CM | POA: Diagnosis not present

## 2021-10-15 DIAGNOSIS — H43813 Vitreous degeneration, bilateral: Secondary | ICD-10-CM | POA: Diagnosis not present

## 2021-10-15 DIAGNOSIS — Z79899 Other long term (current) drug therapy: Secondary | ICD-10-CM | POA: Diagnosis not present

## 2021-10-27 DIAGNOSIS — Z794 Long term (current) use of insulin: Secondary | ICD-10-CM | POA: Diagnosis not present

## 2021-10-27 DIAGNOSIS — E1065 Type 1 diabetes mellitus with hyperglycemia: Secondary | ICD-10-CM | POA: Diagnosis not present

## 2021-11-05 DIAGNOSIS — J301 Allergic rhinitis due to pollen: Secondary | ICD-10-CM | POA: Diagnosis not present

## 2021-11-05 DIAGNOSIS — J441 Chronic obstructive pulmonary disease with (acute) exacerbation: Secondary | ICD-10-CM | POA: Diagnosis not present

## 2021-11-06 DIAGNOSIS — E1165 Type 2 diabetes mellitus with hyperglycemia: Secondary | ICD-10-CM | POA: Diagnosis not present

## 2021-11-06 DIAGNOSIS — I10 Essential (primary) hypertension: Secondary | ICD-10-CM | POA: Diagnosis not present

## 2021-11-06 DIAGNOSIS — Z79899 Other long term (current) drug therapy: Secondary | ICD-10-CM | POA: Diagnosis not present

## 2021-11-06 DIAGNOSIS — R030 Elevated blood-pressure reading, without diagnosis of hypertension: Secondary | ICD-10-CM | POA: Diagnosis not present

## 2021-11-06 DIAGNOSIS — G894 Chronic pain syndrome: Secondary | ICD-10-CM | POA: Diagnosis not present

## 2021-11-07 ENCOUNTER — Encounter (HOSPITAL_COMMUNITY): Payer: Self-pay | Admitting: Psychiatry

## 2021-11-07 ENCOUNTER — Telehealth (HOSPITAL_BASED_OUTPATIENT_CLINIC_OR_DEPARTMENT_OTHER): Payer: Medicare Other | Admitting: Psychiatry

## 2021-11-07 DIAGNOSIS — F4310 Post-traumatic stress disorder, unspecified: Secondary | ICD-10-CM

## 2021-11-07 DIAGNOSIS — F411 Generalized anxiety disorder: Secondary | ICD-10-CM | POA: Diagnosis not present

## 2021-11-07 MED ORDER — MIRTAZAPINE 15 MG PO TABS: 15.0000 mg | ORAL_TABLET | Freq: Every day | ORAL | 2 refills | Status: DC

## 2021-11-07 MED ORDER — ALPRAZOLAM 0.5 MG PO TABS: 0.5000 mg | ORAL_TABLET | Freq: Two times a day (BID) | ORAL | 2 refills | Status: DC | PRN
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
## 2021-11-16 ENCOUNTER — Other Ambulatory Visit: Payer: Self-pay | Admitting: Internal Medicine

## 2021-11-16 DIAGNOSIS — I10 Essential (primary) hypertension: Secondary | ICD-10-CM | POA: Diagnosis not present

## 2021-11-16 DIAGNOSIS — J438 Other emphysema: Secondary | ICD-10-CM | POA: Diagnosis not present

## 2021-11-16 DIAGNOSIS — Z122 Encounter for screening for malignant neoplasm of respiratory organs: Secondary | ICD-10-CM

## 2021-11-16 DIAGNOSIS — Z0000 Encounter for general adult medical examination without abnormal findings: Secondary | ICD-10-CM | POA: Diagnosis not present

## 2021-11-16 DIAGNOSIS — E559 Vitamin D deficiency, unspecified: Secondary | ICD-10-CM | POA: Diagnosis not present

## 2021-11-16 DIAGNOSIS — E1042 Type 1 diabetes mellitus with diabetic polyneuropathy: Secondary | ICD-10-CM | POA: Diagnosis not present

## 2021-11-16 DIAGNOSIS — M159 Polyosteoarthritis, unspecified: Secondary | ICD-10-CM | POA: Diagnosis not present

## 2021-11-16 DIAGNOSIS — J449 Chronic obstructive pulmonary disease, unspecified: Secondary | ICD-10-CM | POA: Diagnosis not present

## 2021-11-16 DIAGNOSIS — Z23 Encounter for immunization: Secondary | ICD-10-CM | POA: Diagnosis not present

## 2021-11-16 DIAGNOSIS — N183 Chronic kidney disease, stage 3 unspecified: Secondary | ICD-10-CM | POA: Diagnosis not present

## 2021-11-16 DIAGNOSIS — I700 Atherosclerosis of aorta: Secondary | ICD-10-CM | POA: Diagnosis not present

## 2021-11-27 DIAGNOSIS — Z794 Long term (current) use of insulin: Secondary | ICD-10-CM | POA: Diagnosis not present

## 2021-11-27 DIAGNOSIS — E1065 Type 1 diabetes mellitus with hyperglycemia: Secondary | ICD-10-CM | POA: Diagnosis not present

## 2021-12-06 DIAGNOSIS — G894 Chronic pain syndrome: Secondary | ICD-10-CM | POA: Diagnosis not present

## 2021-12-06 DIAGNOSIS — E1165 Type 2 diabetes mellitus with hyperglycemia: Secondary | ICD-10-CM | POA: Diagnosis not present

## 2021-12-06 DIAGNOSIS — R030 Elevated blood-pressure reading, without diagnosis of hypertension: Secondary | ICD-10-CM | POA: Diagnosis not present

## 2021-12-06 DIAGNOSIS — I10 Essential (primary) hypertension: Secondary | ICD-10-CM | POA: Diagnosis not present

## 2021-12-06 DIAGNOSIS — Z79899 Other long term (current) drug therapy: Secondary | ICD-10-CM | POA: Diagnosis not present

## 2021-12-12 DIAGNOSIS — J441 Chronic obstructive pulmonary disease with (acute) exacerbation: Secondary | ICD-10-CM | POA: Diagnosis not present

## 2021-12-17 ENCOUNTER — Encounter (INDEPENDENT_AMBULATORY_CARE_PROVIDER_SITE_OTHER): Payer: Medicare Other | Admitting: Ophthalmology

## 2021-12-17 DIAGNOSIS — E113591 Type 2 diabetes mellitus with proliferative diabetic retinopathy without macular edema, right eye: Secondary | ICD-10-CM

## 2021-12-17 DIAGNOSIS — H43813 Vitreous degeneration, bilateral: Secondary | ICD-10-CM | POA: Diagnosis not present

## 2021-12-17 DIAGNOSIS — I10 Essential (primary) hypertension: Secondary | ICD-10-CM | POA: Diagnosis not present

## 2021-12-17 DIAGNOSIS — H35033 Hypertensive retinopathy, bilateral: Secondary | ICD-10-CM

## 2021-12-17 DIAGNOSIS — E113512 Type 2 diabetes mellitus with proliferative diabetic retinopathy with macular edema, left eye: Secondary | ICD-10-CM | POA: Diagnosis not present

## 2021-12-17 DIAGNOSIS — H4312 Vitreous hemorrhage, left eye: Secondary | ICD-10-CM

## 2021-12-17 DIAGNOSIS — E1065 Type 1 diabetes mellitus with hyperglycemia: Secondary | ICD-10-CM | POA: Diagnosis not present

## 2021-12-17 DIAGNOSIS — Z794 Long term (current) use of insulin: Secondary | ICD-10-CM | POA: Diagnosis not present

## 2021-12-28 DIAGNOSIS — E1065 Type 1 diabetes mellitus with hyperglycemia: Secondary | ICD-10-CM | POA: Diagnosis not present

## 2021-12-28 DIAGNOSIS — Z794 Long term (current) use of insulin: Secondary | ICD-10-CM | POA: Diagnosis not present

## 2022-01-02 ENCOUNTER — Other Ambulatory Visit: Payer: Medicare Other

## 2022-01-04 DIAGNOSIS — F32A Depression, unspecified: Secondary | ICD-10-CM | POA: Diagnosis not present

## 2022-01-04 DIAGNOSIS — Z0000 Encounter for general adult medical examination without abnormal findings: Secondary | ICD-10-CM | POA: Diagnosis not present

## 2022-01-04 DIAGNOSIS — E1165 Type 2 diabetes mellitus with hyperglycemia: Secondary | ICD-10-CM | POA: Diagnosis not present

## 2022-01-04 DIAGNOSIS — M129 Arthropathy, unspecified: Secondary | ICD-10-CM | POA: Diagnosis not present

## 2022-01-04 DIAGNOSIS — E559 Vitamin D deficiency, unspecified: Secondary | ICD-10-CM | POA: Diagnosis not present

## 2022-01-04 DIAGNOSIS — F17210 Nicotine dependence, cigarettes, uncomplicated: Secondary | ICD-10-CM | POA: Diagnosis not present

## 2022-01-04 DIAGNOSIS — E7800 Pure hypercholesterolemia, unspecified: Secondary | ICD-10-CM | POA: Diagnosis not present

## 2022-01-04 DIAGNOSIS — Z79899 Other long term (current) drug therapy: Secondary | ICD-10-CM | POA: Diagnosis not present

## 2022-01-04 DIAGNOSIS — G894 Chronic pain syndrome: Secondary | ICD-10-CM | POA: Diagnosis not present

## 2022-01-07 DIAGNOSIS — E1059 Type 1 diabetes mellitus with other circulatory complications: Secondary | ICD-10-CM | POA: Diagnosis not present

## 2022-01-07 DIAGNOSIS — M810 Age-related osteoporosis without current pathological fracture: Secondary | ICD-10-CM | POA: Diagnosis not present

## 2022-01-07 DIAGNOSIS — I10 Essential (primary) hypertension: Secondary | ICD-10-CM | POA: Diagnosis not present

## 2022-01-07 DIAGNOSIS — I7090 Unspecified atherosclerosis: Secondary | ICD-10-CM | POA: Diagnosis not present

## 2022-01-07 DIAGNOSIS — N1831 Chronic kidney disease, stage 3a: Secondary | ICD-10-CM | POA: Diagnosis not present

## 2022-01-07 DIAGNOSIS — Z720 Tobacco use: Secondary | ICD-10-CM | POA: Diagnosis not present

## 2022-01-07 DIAGNOSIS — Z794 Long term (current) use of insulin: Secondary | ICD-10-CM | POA: Diagnosis not present

## 2022-01-07 DIAGNOSIS — E1042 Type 1 diabetes mellitus with diabetic polyneuropathy: Secondary | ICD-10-CM | POA: Diagnosis not present

## 2022-01-07 DIAGNOSIS — K219 Gastro-esophageal reflux disease without esophagitis: Secondary | ICD-10-CM | POA: Diagnosis not present

## 2022-01-14 ENCOUNTER — Other Ambulatory Visit: Payer: Self-pay

## 2022-01-14 ENCOUNTER — Telehealth: Payer: Self-pay | Admitting: Pharmacy Technician

## 2022-01-14 ENCOUNTER — Encounter (INDEPENDENT_AMBULATORY_CARE_PROVIDER_SITE_OTHER): Payer: Medicare Other | Admitting: Ophthalmology

## 2022-01-14 DIAGNOSIS — H43813 Vitreous degeneration, bilateral: Secondary | ICD-10-CM

## 2022-01-14 DIAGNOSIS — E113591 Type 2 diabetes mellitus with proliferative diabetic retinopathy without macular edema, right eye: Secondary | ICD-10-CM | POA: Diagnosis not present

## 2022-01-14 DIAGNOSIS — H35033 Hypertensive retinopathy, bilateral: Secondary | ICD-10-CM

## 2022-01-14 DIAGNOSIS — I10 Essential (primary) hypertension: Secondary | ICD-10-CM | POA: Diagnosis not present

## 2022-01-14 DIAGNOSIS — H4312 Vitreous hemorrhage, left eye: Secondary | ICD-10-CM

## 2022-01-14 DIAGNOSIS — E113512 Type 2 diabetes mellitus with proliferative diabetic retinopathy with macular edema, left eye: Secondary | ICD-10-CM | POA: Diagnosis not present

## 2022-01-14 DIAGNOSIS — M810 Age-related osteoporosis without current pathological fracture: Secondary | ICD-10-CM

## 2022-01-14 NOTE — Telephone Encounter (Signed)
Auth Submission: no auth needed Payer: [employer] MEDICARE Medication & CPT/J Code(s) submitted: Reclast (Zolendronic acid) [id number] Route of submission (phone, fax, portal): PORTAL Auth type: Buy/Bill Units/visits requested: X1 DOSE Reference number: [id number] Approval from: 01/14/22 to 01/15/23 at [place]

## 2022-01-17 DIAGNOSIS — Z794 Long term (current) use of insulin: Secondary | ICD-10-CM | POA: Diagnosis not present

## 2022-01-17 DIAGNOSIS — E1065 Type 1 diabetes mellitus with hyperglycemia: Secondary | ICD-10-CM | POA: Diagnosis not present

## 2022-02-01 DIAGNOSIS — F17210 Nicotine dependence, cigarettes, uncomplicated: Secondary | ICD-10-CM | POA: Diagnosis not present

## 2022-02-01 DIAGNOSIS — Z79899 Other long term (current) drug therapy: Secondary | ICD-10-CM | POA: Diagnosis not present

## 2022-02-01 DIAGNOSIS — R030 Elevated blood-pressure reading, without diagnosis of hypertension: Secondary | ICD-10-CM | POA: Diagnosis not present

## 2022-02-01 DIAGNOSIS — I10 Essential (primary) hypertension: Secondary | ICD-10-CM | POA: Diagnosis not present

## 2022-02-01 DIAGNOSIS — G894 Chronic pain syndrome: Secondary | ICD-10-CM | POA: Diagnosis not present

## 2022-02-01 DIAGNOSIS — E7800 Pure hypercholesterolemia, unspecified: Secondary | ICD-10-CM | POA: Diagnosis not present

## 2022-02-01 DIAGNOSIS — E1165 Type 2 diabetes mellitus with hyperglycemia: Secondary | ICD-10-CM | POA: Diagnosis not present

## 2022-02-01 DIAGNOSIS — M818 Other osteoporosis without current pathological fracture: Secondary | ICD-10-CM | POA: Diagnosis not present

## 2022-02-06 ENCOUNTER — Encounter (HOSPITAL_COMMUNITY): Payer: Self-pay | Admitting: Psychiatry

## 2022-02-06 ENCOUNTER — Telehealth (HOSPITAL_BASED_OUTPATIENT_CLINIC_OR_DEPARTMENT_OTHER): Payer: Medicare Other | Admitting: Psychiatry

## 2022-02-06 DIAGNOSIS — F411 Generalized anxiety disorder: Secondary | ICD-10-CM

## 2022-02-06 DIAGNOSIS — F4310 Post-traumatic stress disorder, unspecified: Secondary | ICD-10-CM | POA: Diagnosis not present

## 2022-02-06 MED ORDER — MIRTAZAPINE 15 MG PO TABS: 15.0000 mg | ORAL_TABLET | Freq: Every day | ORAL | 1 refills | Status: DC

## 2022-02-06 MED ORDER — ALPRAZOLAM 0.5 MG PO TABS: 0.5000 mg | ORAL_TABLET | Freq: Two times a day (BID) | ORAL | 1 refills | Status: DC | PRN
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
## 2022-02-11 ENCOUNTER — Encounter (INDEPENDENT_AMBULATORY_CARE_PROVIDER_SITE_OTHER): Payer: Medicare Other | Admitting: Ophthalmology

## 2022-02-14 ENCOUNTER — Encounter (INDEPENDENT_AMBULATORY_CARE_PROVIDER_SITE_OTHER): Payer: Medicare Other | Admitting: Ophthalmology

## 2022-02-14 ENCOUNTER — Encounter (INDEPENDENT_AMBULATORY_CARE_PROVIDER_SITE_OTHER): Payer: Self-pay

## 2022-03-01 DIAGNOSIS — G894 Chronic pain syndrome: Secondary | ICD-10-CM | POA: Diagnosis not present

## 2022-03-01 DIAGNOSIS — Z79899 Other long term (current) drug therapy: Secondary | ICD-10-CM | POA: Diagnosis not present

## 2022-03-01 DIAGNOSIS — I10 Essential (primary) hypertension: Secondary | ICD-10-CM | POA: Diagnosis not present

## 2022-03-01 DIAGNOSIS — E1165 Type 2 diabetes mellitus with hyperglycemia: Secondary | ICD-10-CM | POA: Diagnosis not present

## 2022-03-01 DIAGNOSIS — E7800 Pure hypercholesterolemia, unspecified: Secondary | ICD-10-CM | POA: Diagnosis not present

## 2022-03-01 DIAGNOSIS — R030 Elevated blood-pressure reading, without diagnosis of hypertension: Secondary | ICD-10-CM | POA: Diagnosis not present

## 2022-03-01 DIAGNOSIS — M818 Other osteoporosis without current pathological fracture: Secondary | ICD-10-CM | POA: Diagnosis not present

## 2022-03-01 DIAGNOSIS — F17210 Nicotine dependence, cigarettes, uncomplicated: Secondary | ICD-10-CM | POA: Diagnosis not present

## 2022-03-05 ENCOUNTER — Ambulatory Visit: Payer: Medicare Other

## 2022-03-07 DIAGNOSIS — Z794 Long term (current) use of insulin: Secondary | ICD-10-CM | POA: Diagnosis not present

## 2022-03-07 DIAGNOSIS — E1065 Type 1 diabetes mellitus with hyperglycemia: Secondary | ICD-10-CM | POA: Diagnosis not present

## 2022-03-27 DIAGNOSIS — H524 Presbyopia: Secondary | ICD-10-CM | POA: Diagnosis not present

## 2022-03-27 DIAGNOSIS — H5203 Hypermetropia, bilateral: Secondary | ICD-10-CM | POA: Diagnosis not present

## 2022-04-01 ENCOUNTER — Encounter (INDEPENDENT_AMBULATORY_CARE_PROVIDER_SITE_OTHER): Payer: Medicare Other | Admitting: Ophthalmology

## 2022-04-01 DIAGNOSIS — H35033 Hypertensive retinopathy, bilateral: Secondary | ICD-10-CM

## 2022-04-01 DIAGNOSIS — H4312 Vitreous hemorrhage, left eye: Secondary | ICD-10-CM | POA: Diagnosis not present

## 2022-04-01 DIAGNOSIS — E113512 Type 2 diabetes mellitus with proliferative diabetic retinopathy with macular edema, left eye: Secondary | ICD-10-CM

## 2022-04-01 DIAGNOSIS — I10 Essential (primary) hypertension: Secondary | ICD-10-CM

## 2022-04-01 DIAGNOSIS — H43813 Vitreous degeneration, bilateral: Secondary | ICD-10-CM | POA: Diagnosis not present

## 2022-04-01 DIAGNOSIS — E113591 Type 2 diabetes mellitus with proliferative diabetic retinopathy without macular edema, right eye: Secondary | ICD-10-CM | POA: Diagnosis not present

## 2022-04-05 DIAGNOSIS — R7309 Other abnormal glucose: Secondary | ICD-10-CM | POA: Diagnosis not present

## 2022-04-05 DIAGNOSIS — R030 Elevated blood-pressure reading, without diagnosis of hypertension: Secondary | ICD-10-CM | POA: Diagnosis not present

## 2022-04-05 DIAGNOSIS — G894 Chronic pain syndrome: Secondary | ICD-10-CM | POA: Diagnosis not present

## 2022-04-05 DIAGNOSIS — M818 Other osteoporosis without current pathological fracture: Secondary | ICD-10-CM | POA: Diagnosis not present

## 2022-04-05 DIAGNOSIS — E1165 Type 2 diabetes mellitus with hyperglycemia: Secondary | ICD-10-CM | POA: Diagnosis not present

## 2022-04-05 DIAGNOSIS — F17210 Nicotine dependence, cigarettes, uncomplicated: Secondary | ICD-10-CM | POA: Diagnosis not present

## 2022-04-05 DIAGNOSIS — E7800 Pure hypercholesterolemia, unspecified: Secondary | ICD-10-CM | POA: Diagnosis not present

## 2022-04-05 DIAGNOSIS — I10 Essential (primary) hypertension: Secondary | ICD-10-CM | POA: Diagnosis not present

## 2022-04-08 ENCOUNTER — Other Ambulatory Visit (HOSPITAL_COMMUNITY): Payer: Self-pay | Admitting: Psychiatry

## 2022-04-08 ENCOUNTER — Encounter (HOSPITAL_COMMUNITY): Payer: Self-pay | Admitting: Psychiatry

## 2022-04-08 ENCOUNTER — Telehealth (HOSPITAL_BASED_OUTPATIENT_CLINIC_OR_DEPARTMENT_OTHER): Payer: Medicare Other | Admitting: Psychiatry

## 2022-04-08 DIAGNOSIS — F411 Generalized anxiety disorder: Secondary | ICD-10-CM

## 2022-04-08 DIAGNOSIS — F4310 Post-traumatic stress disorder, unspecified: Secondary | ICD-10-CM | POA: Diagnosis not present

## 2022-04-08 MED ORDER — ALPRAZOLAM 0.5 MG PO TABS: 0.5000 mg | ORAL_TABLET | Freq: Two times a day (BID) | ORAL | 2 refills | Status: DC | PRN

## 2022-04-08 MED ORDER — MIRTAZAPINE 15 MG PO TABS: 15.0000 mg | ORAL_TABLET | Freq: Every day | ORAL | 2 refills | Status: DC
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
## 2022-04-11 DIAGNOSIS — Z720 Tobacco use: Secondary | ICD-10-CM | POA: Diagnosis not present

## 2022-04-11 DIAGNOSIS — M810 Age-related osteoporosis without current pathological fracture: Secondary | ICD-10-CM | POA: Diagnosis not present

## 2022-04-11 DIAGNOSIS — I10 Essential (primary) hypertension: Secondary | ICD-10-CM | POA: Diagnosis not present

## 2022-04-11 DIAGNOSIS — E1042 Type 1 diabetes mellitus with diabetic polyneuropathy: Secondary | ICD-10-CM | POA: Diagnosis not present

## 2022-04-11 DIAGNOSIS — K219 Gastro-esophageal reflux disease without esophagitis: Secondary | ICD-10-CM | POA: Diagnosis not present

## 2022-04-11 DIAGNOSIS — Z794 Long term (current) use of insulin: Secondary | ICD-10-CM | POA: Diagnosis not present

## 2022-04-11 DIAGNOSIS — I7090 Unspecified atherosclerosis: Secondary | ICD-10-CM | POA: Diagnosis not present

## 2022-04-11 DIAGNOSIS — E1059 Type 1 diabetes mellitus with other circulatory complications: Secondary | ICD-10-CM | POA: Diagnosis not present

## 2022-04-11 DIAGNOSIS — N1831 Chronic kidney disease, stage 3a: Secondary | ICD-10-CM | POA: Diagnosis not present

## 2022-04-17 ENCOUNTER — Ambulatory Visit (INDEPENDENT_AMBULATORY_CARE_PROVIDER_SITE_OTHER): Payer: Medicare Other

## 2022-04-17 ENCOUNTER — Other Ambulatory Visit: Payer: Self-pay | Admitting: Pharmacy Technician

## 2022-04-17 VITALS — BP 138/74 | HR 66 | Temp 97.8°F | Resp 18 | Ht <= 58 in | Wt 130.8 lb

## 2022-04-17 DIAGNOSIS — M810 Age-related osteoporosis without current pathological fracture: Secondary | ICD-10-CM

## 2022-04-17 MED ORDER — DIPHENHYDRAMINE HCL 25 MG PO CAPS
25.0000 mg | ORAL_CAPSULE | Freq: Once | ORAL | Status: AC
  Administered 2022-04-17: 25 mg via ORAL
  Filled 2022-04-17: qty 1

## 2022-04-17 MED ORDER — ACETAMINOPHEN 325 MG PO TABS
650.0000 mg | ORAL_TABLET | Freq: Once | ORAL | Status: AC
  Administered 2022-04-17: 650 mg via ORAL
  Filled 2022-04-17: qty 2

## 2022-04-17 MED ORDER — SODIUM CHLORIDE 0.9 % IV SOLN: INTRAVENOUS | Status: DC

## 2022-04-17 MED ORDER — ZOLEDRONIC ACID 5 MG/100ML IV SOLN
5.0000 mg | Freq: Once | INTRAVENOUS | Status: AC
  Administered 2022-04-17: 5 mg via INTRAVENOUS
  Filled 2022-04-17: qty 100

## 2022-04-17 NOTE — Progress Notes (Signed)
Diagnosis: Osteoporosis  Provider:  Marshell Garfinkel MD  Procedure: Infusion  IV Type: Peripheral, IV Location: L Hand  Reclast (Zolendronic Acid), Dose: 5 mg  Infusion Start Time: 6812  Infusion Stop Time: 7517  Post Infusion IV Care: Observation period completed and Peripheral IV Discontinued  Discharge: Condition: Good, Destination: Home . AVS provided to patient.   Performed by:  Adelina Mings, LPN

## 2022-04-24 ENCOUNTER — Ambulatory Visit (INDEPENDENT_AMBULATORY_CARE_PROVIDER_SITE_OTHER): Payer: Medicare Other | Admitting: Podiatry

## 2022-04-24 DIAGNOSIS — M79674 Pain in right toe(s): Secondary | ICD-10-CM | POA: Diagnosis not present

## 2022-04-24 DIAGNOSIS — M79675 Pain in left toe(s): Secondary | ICD-10-CM | POA: Diagnosis not present

## 2022-04-24 DIAGNOSIS — E1140 Type 2 diabetes mellitus with diabetic neuropathy, unspecified: Secondary | ICD-10-CM | POA: Diagnosis not present

## 2022-04-24 DIAGNOSIS — E1149 Type 2 diabetes mellitus with other diabetic neurological complication: Secondary | ICD-10-CM | POA: Diagnosis not present

## 2022-04-24 DIAGNOSIS — B351 Tinea unguium: Secondary | ICD-10-CM | POA: Diagnosis not present

## 2022-04-24 DIAGNOSIS — M21619 Bunion of unspecified foot: Secondary | ICD-10-CM | POA: Diagnosis not present

## 2022-04-24 NOTE — Progress Notes (Signed)
Subjective:   Patient ID: [name], female   DOB: 61 y.o.   MRN: [id number]   HPI Patient presents stating she has pain in both of her feet has had long-term diabetes is doing better as she is now on the pump and Dexcom but does have bunions pain and needs diabetic shoes and does have approval from her family physician Dr. Delrae Rend   Review of Systems  All other systems reviewed and are negative.       Objective:  Physical Exam Vitals and nursing note reviewed.  Constitutional:      Appearance: She is well-developed.  Pulmonary:     Effort: Pulmonary effort is normal.  Musculoskeletal:        General: Normal range of motion.  Skin:    General: Skin is warm.  Neurological:     Mental Status: She is alert.     Neurovascular status found to be mildly diminished both PT DP pulses and diminishment of sharp dull vibratory bilateral.  Patient has hyperostosis around the first metatarsal heads bilaterally do get painful and are higher risk due to her long-term diabetes and has incurvated nailbeds that get thick and she cannot cut 1-5 both feet and they do get painful     Assessment:  High risk patient who does have long-term diabetic neuropathy mycotic nail disease with pain     Plan:  H&P reviewed and today I went ahead debrided nailbeds 1-5 both feet no angiogenic bleeding and discussed diabetic shoes and we will get these approved for her and made

## 2022-05-06 DIAGNOSIS — R768 Other specified abnormal immunological findings in serum: Secondary | ICD-10-CM | POA: Diagnosis not present

## 2022-05-06 DIAGNOSIS — Z79899 Other long term (current) drug therapy: Secondary | ICD-10-CM | POA: Diagnosis not present

## 2022-05-06 DIAGNOSIS — G894 Chronic pain syndrome: Secondary | ICD-10-CM | POA: Diagnosis not present

## 2022-05-06 DIAGNOSIS — E1165 Type 2 diabetes mellitus with hyperglycemia: Secondary | ICD-10-CM | POA: Diagnosis not present

## 2022-05-06 DIAGNOSIS — N183 Chronic kidney disease, stage 3 unspecified: Secondary | ICD-10-CM | POA: Diagnosis not present

## 2022-05-06 DIAGNOSIS — J449 Chronic obstructive pulmonary disease, unspecified: Secondary | ICD-10-CM | POA: Diagnosis not present

## 2022-05-06 DIAGNOSIS — I10 Essential (primary) hypertension: Secondary | ICD-10-CM | POA: Diagnosis not present

## 2022-05-06 DIAGNOSIS — M818 Other osteoporosis without current pathological fracture: Secondary | ICD-10-CM | POA: Diagnosis not present

## 2022-05-06 DIAGNOSIS — R7309 Other abnormal glucose: Secondary | ICD-10-CM | POA: Diagnosis not present

## 2022-05-06 DIAGNOSIS — F17210 Nicotine dependence, cigarettes, uncomplicated: Secondary | ICD-10-CM | POA: Diagnosis not present

## 2022-05-06 DIAGNOSIS — R030 Elevated blood-pressure reading, without diagnosis of hypertension: Secondary | ICD-10-CM | POA: Diagnosis not present

## 2022-05-06 DIAGNOSIS — E7800 Pure hypercholesterolemia, unspecified: Secondary | ICD-10-CM | POA: Diagnosis not present

## 2022-05-08 ENCOUNTER — Other Ambulatory Visit: Payer: Self-pay | Admitting: Physician Assistant

## 2022-05-08 ENCOUNTER — Ambulatory Visit
Admission: RE | Admit: 2022-05-08 | Discharge: 2022-05-08 | Disposition: A | Discharge status: Home / Self Care | Payer: Medicare Other | Source: Ambulatory Visit | Attending: Physician Assistant | Admitting: Physician Assistant

## 2022-05-08 DIAGNOSIS — K5730 Diverticulosis of large intestine without perforation or abscess without bleeding: Secondary | ICD-10-CM | POA: Diagnosis not present

## 2022-05-08 DIAGNOSIS — R10824 Left lower quadrant rebound abdominal tenderness: Secondary | ICD-10-CM

## 2022-05-08 DIAGNOSIS — K820 Obstruction of gallbladder: Secondary | ICD-10-CM | POA: Diagnosis not present

## 2022-05-08 DIAGNOSIS — K8689 Other specified diseases of pancreas: Secondary | ICD-10-CM | POA: Diagnosis not present

## 2022-05-08 DIAGNOSIS — R112 Nausea with vomiting, unspecified: Secondary | ICD-10-CM | POA: Diagnosis not present

## 2022-05-08 MED ORDER — IOPAMIDOL (ISOVUE-300) INJECTION 61%
80.0000 mL | Freq: Once | INTRAVENOUS | Status: AC | PRN
  Administered 2022-05-08: 80 mL via INTRAVENOUS

## 2022-05-13 ENCOUNTER — Encounter (INDEPENDENT_AMBULATORY_CARE_PROVIDER_SITE_OTHER): Payer: Medicare Other | Admitting: Ophthalmology

## 2022-05-13 DIAGNOSIS — R109 Unspecified abdominal pain: Secondary | ICD-10-CM | POA: Diagnosis not present

## 2022-05-14 ENCOUNTER — Encounter (INDEPENDENT_AMBULATORY_CARE_PROVIDER_SITE_OTHER): Payer: Medicare Other | Admitting: Ophthalmology

## 2022-05-27 DIAGNOSIS — E1065 Type 1 diabetes mellitus with hyperglycemia: Secondary | ICD-10-CM | POA: Diagnosis not present

## 2022-05-27 DIAGNOSIS — Z794 Long term (current) use of insulin: Secondary | ICD-10-CM | POA: Diagnosis not present

## 2022-06-03 DIAGNOSIS — M810 Age-related osteoporosis without current pathological fracture: Secondary | ICD-10-CM | POA: Diagnosis not present

## 2022-06-03 DIAGNOSIS — K219 Gastro-esophageal reflux disease without esophagitis: Secondary | ICD-10-CM | POA: Diagnosis not present

## 2022-06-03 DIAGNOSIS — E1059 Type 1 diabetes mellitus with other circulatory complications: Secondary | ICD-10-CM | POA: Diagnosis not present

## 2022-06-03 DIAGNOSIS — I10 Essential (primary) hypertension: Secondary | ICD-10-CM | POA: Diagnosis not present

## 2022-06-03 DIAGNOSIS — E1042 Type 1 diabetes mellitus with diabetic polyneuropathy: Secondary | ICD-10-CM | POA: Diagnosis not present

## 2022-06-03 DIAGNOSIS — Z794 Long term (current) use of insulin: Secondary | ICD-10-CM | POA: Diagnosis not present

## 2022-06-03 DIAGNOSIS — N1831 Chronic kidney disease, stage 3a: Secondary | ICD-10-CM | POA: Diagnosis not present

## 2022-06-03 DIAGNOSIS — Z720 Tobacco use: Secondary | ICD-10-CM | POA: Diagnosis not present

## 2022-06-04 DIAGNOSIS — R7309 Other abnormal glucose: Secondary | ICD-10-CM | POA: Diagnosis not present

## 2022-06-04 DIAGNOSIS — M5412 Radiculopathy, cervical region: Secondary | ICD-10-CM | POA: Diagnosis not present

## 2022-06-04 DIAGNOSIS — M25512 Pain in left shoulder: Secondary | ICD-10-CM | POA: Diagnosis not present

## 2022-06-04 DIAGNOSIS — F17210 Nicotine dependence, cigarettes, uncomplicated: Secondary | ICD-10-CM | POA: Diagnosis not present

## 2022-06-04 DIAGNOSIS — G894 Chronic pain syndrome: Secondary | ICD-10-CM | POA: Diagnosis not present

## 2022-06-04 DIAGNOSIS — I10 Essential (primary) hypertension: Secondary | ICD-10-CM | POA: Diagnosis not present

## 2022-06-04 DIAGNOSIS — J449 Chronic obstructive pulmonary disease, unspecified: Secondary | ICD-10-CM | POA: Diagnosis not present

## 2022-06-04 DIAGNOSIS — N183 Chronic kidney disease, stage 3 unspecified: Secondary | ICD-10-CM | POA: Diagnosis not present

## 2022-06-04 DIAGNOSIS — M818 Other osteoporosis without current pathological fracture: Secondary | ICD-10-CM | POA: Diagnosis not present

## 2022-06-04 DIAGNOSIS — R030 Elevated blood-pressure reading, without diagnosis of hypertension: Secondary | ICD-10-CM | POA: Diagnosis not present

## 2022-06-04 DIAGNOSIS — E1165 Type 2 diabetes mellitus with hyperglycemia: Secondary | ICD-10-CM | POA: Diagnosis not present

## 2022-06-10 DIAGNOSIS — E1065 Type 1 diabetes mellitus with hyperglycemia: Secondary | ICD-10-CM | POA: Diagnosis not present

## 2022-06-19 ENCOUNTER — Encounter (INDEPENDENT_AMBULATORY_CARE_PROVIDER_SITE_OTHER): Payer: Medicare Other | Admitting: Ophthalmology

## 2022-06-19 DIAGNOSIS — H4312 Vitreous hemorrhage, left eye: Secondary | ICD-10-CM | POA: Diagnosis not present

## 2022-06-19 DIAGNOSIS — H43813 Vitreous degeneration, bilateral: Secondary | ICD-10-CM

## 2022-06-19 DIAGNOSIS — I10 Essential (primary) hypertension: Secondary | ICD-10-CM

## 2022-06-19 DIAGNOSIS — H35033 Hypertensive retinopathy, bilateral: Secondary | ICD-10-CM

## 2022-06-19 DIAGNOSIS — E113591 Type 2 diabetes mellitus with proliferative diabetic retinopathy without macular edema, right eye: Secondary | ICD-10-CM

## 2022-06-19 DIAGNOSIS — E113512 Type 2 diabetes mellitus with proliferative diabetic retinopathy with macular edema, left eye: Secondary | ICD-10-CM | POA: Diagnosis not present

## 2022-07-08 ENCOUNTER — Encounter (HOSPITAL_COMMUNITY): Payer: Self-pay | Admitting: Psychiatry

## 2022-07-08 ENCOUNTER — Telehealth (HOSPITAL_BASED_OUTPATIENT_CLINIC_OR_DEPARTMENT_OTHER): Payer: Medicare Other | Admitting: Psychiatry

## 2022-07-08 DIAGNOSIS — F4310 Post-traumatic stress disorder, unspecified: Secondary | ICD-10-CM | POA: Diagnosis not present

## 2022-07-08 DIAGNOSIS — F411 Generalized anxiety disorder: Secondary | ICD-10-CM

## 2022-07-08 MED ORDER — ALPRAZOLAM 0.5 MG PO TABS: 0.5000 mg | ORAL_TABLET | Freq: Two times a day (BID) | ORAL | 2 refills | Status: DC | PRN

## 2022-07-08 MED ORDER — MIRTAZAPINE 15 MG PO TABS: 15.0000 mg | ORAL_TABLET | Freq: Every day | ORAL | 2 refills | Status: DC
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
## 2022-07-10 DIAGNOSIS — G894 Chronic pain syndrome: Secondary | ICD-10-CM | POA: Diagnosis not present

## 2022-07-10 DIAGNOSIS — R030 Elevated blood-pressure reading, without diagnosis of hypertension: Secondary | ICD-10-CM | POA: Diagnosis not present

## 2022-07-10 DIAGNOSIS — Z0000 Encounter for general adult medical examination without abnormal findings: Secondary | ICD-10-CM | POA: Diagnosis not present

## 2022-07-10 DIAGNOSIS — I10 Essential (primary) hypertension: Secondary | ICD-10-CM | POA: Diagnosis not present

## 2022-07-10 DIAGNOSIS — M818 Other osteoporosis without current pathological fracture: Secondary | ICD-10-CM | POA: Diagnosis not present

## 2022-07-10 DIAGNOSIS — M25512 Pain in left shoulder: Secondary | ICD-10-CM | POA: Diagnosis not present

## 2022-07-10 DIAGNOSIS — R7309 Other abnormal glucose: Secondary | ICD-10-CM | POA: Diagnosis not present

## 2022-07-10 DIAGNOSIS — F17210 Nicotine dependence, cigarettes, uncomplicated: Secondary | ICD-10-CM | POA: Diagnosis not present

## 2022-07-10 DIAGNOSIS — E1165 Type 2 diabetes mellitus with hyperglycemia: Secondary | ICD-10-CM | POA: Diagnosis not present

## 2022-07-10 DIAGNOSIS — J449 Chronic obstructive pulmonary disease, unspecified: Secondary | ICD-10-CM | POA: Diagnosis not present

## 2022-07-10 DIAGNOSIS — G8929 Other chronic pain: Secondary | ICD-10-CM | POA: Diagnosis not present

## 2022-07-17 ENCOUNTER — Encounter: Payer: Self-pay | Admitting: Pulmonary Disease

## 2022-07-24 ENCOUNTER — Encounter: Payer: Self-pay | Admitting: Pulmonary Disease

## 2022-07-24 DIAGNOSIS — J411 Mucopurulent chronic bronchitis: Secondary | ICD-10-CM | POA: Diagnosis not present

## 2022-07-31 ENCOUNTER — Encounter (INDEPENDENT_AMBULATORY_CARE_PROVIDER_SITE_OTHER): Payer: 59 | Admitting: Ophthalmology

## 2022-07-31 DIAGNOSIS — I10 Essential (primary) hypertension: Secondary | ICD-10-CM

## 2022-07-31 DIAGNOSIS — E113512 Type 2 diabetes mellitus with proliferative diabetic retinopathy with macular edema, left eye: Secondary | ICD-10-CM | POA: Diagnosis not present

## 2022-07-31 DIAGNOSIS — H43813 Vitreous degeneration, bilateral: Secondary | ICD-10-CM | POA: Diagnosis not present

## 2022-07-31 DIAGNOSIS — H35033 Hypertensive retinopathy, bilateral: Secondary | ICD-10-CM

## 2022-07-31 DIAGNOSIS — E113591 Type 2 diabetes mellitus with proliferative diabetic retinopathy without macular edema, right eye: Secondary | ICD-10-CM | POA: Diagnosis not present

## 2022-08-06 DIAGNOSIS — E1065 Type 1 diabetes mellitus with hyperglycemia: Secondary | ICD-10-CM | POA: Diagnosis not present

## 2022-08-06 DIAGNOSIS — Z794 Long term (current) use of insulin: Secondary | ICD-10-CM | POA: Diagnosis not present

## 2022-08-07 DIAGNOSIS — M25512 Pain in left shoulder: Secondary | ICD-10-CM | POA: Diagnosis not present

## 2022-08-07 DIAGNOSIS — M818 Other osteoporosis without current pathological fracture: Secondary | ICD-10-CM | POA: Diagnosis not present

## 2022-08-07 DIAGNOSIS — R030 Elevated blood-pressure reading, without diagnosis of hypertension: Secondary | ICD-10-CM | POA: Diagnosis not present

## 2022-08-07 DIAGNOSIS — G8929 Other chronic pain: Secondary | ICD-10-CM | POA: Diagnosis not present

## 2022-08-07 DIAGNOSIS — N183 Chronic kidney disease, stage 3 unspecified: Secondary | ICD-10-CM | POA: Diagnosis not present

## 2022-08-07 DIAGNOSIS — J449 Chronic obstructive pulmonary disease, unspecified: Secondary | ICD-10-CM | POA: Diagnosis not present

## 2022-08-07 DIAGNOSIS — I10 Essential (primary) hypertension: Secondary | ICD-10-CM | POA: Diagnosis not present

## 2022-08-07 DIAGNOSIS — E1165 Type 2 diabetes mellitus with hyperglycemia: Secondary | ICD-10-CM | POA: Diagnosis not present

## 2022-08-07 DIAGNOSIS — G894 Chronic pain syndrome: Secondary | ICD-10-CM | POA: Diagnosis not present

## 2022-08-07 DIAGNOSIS — F17210 Nicotine dependence, cigarettes, uncomplicated: Secondary | ICD-10-CM | POA: Diagnosis not present

## 2022-08-07 DIAGNOSIS — R7309 Other abnormal glucose: Secondary | ICD-10-CM | POA: Diagnosis not present

## 2022-08-23 DIAGNOSIS — E1042 Type 1 diabetes mellitus with diabetic polyneuropathy: Secondary | ICD-10-CM | POA: Diagnosis not present

## 2022-08-23 DIAGNOSIS — E1059 Type 1 diabetes mellitus with other circulatory complications: Secondary | ICD-10-CM | POA: Diagnosis not present

## 2022-08-23 DIAGNOSIS — Z794 Long term (current) use of insulin: Secondary | ICD-10-CM | POA: Diagnosis not present

## 2022-08-23 DIAGNOSIS — Z720 Tobacco use: Secondary | ICD-10-CM | POA: Diagnosis not present

## 2022-08-23 DIAGNOSIS — N1831 Chronic kidney disease, stage 3a: Secondary | ICD-10-CM | POA: Diagnosis not present

## 2022-08-23 DIAGNOSIS — M810 Age-related osteoporosis without current pathological fracture: Secondary | ICD-10-CM | POA: Diagnosis not present

## 2022-08-23 DIAGNOSIS — I10 Essential (primary) hypertension: Secondary | ICD-10-CM | POA: Diagnosis not present

## 2022-09-05 DIAGNOSIS — M25512 Pain in left shoulder: Secondary | ICD-10-CM | POA: Diagnosis not present

## 2022-09-05 DIAGNOSIS — M818 Other osteoporosis without current pathological fracture: Secondary | ICD-10-CM | POA: Diagnosis not present

## 2022-09-05 DIAGNOSIS — F17210 Nicotine dependence, cigarettes, uncomplicated: Secondary | ICD-10-CM | POA: Diagnosis not present

## 2022-09-05 DIAGNOSIS — M5412 Radiculopathy, cervical region: Secondary | ICD-10-CM | POA: Diagnosis not present

## 2022-09-05 DIAGNOSIS — J449 Chronic obstructive pulmonary disease, unspecified: Secondary | ICD-10-CM | POA: Diagnosis not present

## 2022-09-05 DIAGNOSIS — G894 Chronic pain syndrome: Secondary | ICD-10-CM | POA: Diagnosis not present

## 2022-09-05 DIAGNOSIS — R7309 Other abnormal glucose: Secondary | ICD-10-CM | POA: Diagnosis not present

## 2022-09-05 DIAGNOSIS — N183 Chronic kidney disease, stage 3 unspecified: Secondary | ICD-10-CM | POA: Diagnosis not present

## 2022-09-05 DIAGNOSIS — I10 Essential (primary) hypertension: Secondary | ICD-10-CM | POA: Diagnosis not present

## 2022-09-05 DIAGNOSIS — R030 Elevated blood-pressure reading, without diagnosis of hypertension: Secondary | ICD-10-CM | POA: Diagnosis not present

## 2022-09-05 DIAGNOSIS — E1165 Type 2 diabetes mellitus with hyperglycemia: Secondary | ICD-10-CM | POA: Diagnosis not present

## 2022-09-18 ENCOUNTER — Encounter (INDEPENDENT_AMBULATORY_CARE_PROVIDER_SITE_OTHER): Payer: 59 | Admitting: Ophthalmology

## 2022-09-18 DIAGNOSIS — H35033 Hypertensive retinopathy, bilateral: Secondary | ICD-10-CM

## 2022-09-18 DIAGNOSIS — I10 Essential (primary) hypertension: Secondary | ICD-10-CM

## 2022-09-18 DIAGNOSIS — E113512 Type 2 diabetes mellitus with proliferative diabetic retinopathy with macular edema, left eye: Secondary | ICD-10-CM | POA: Diagnosis not present

## 2022-09-18 DIAGNOSIS — E113591 Type 2 diabetes mellitus with proliferative diabetic retinopathy without macular edema, right eye: Secondary | ICD-10-CM | POA: Diagnosis not present

## 2022-09-18 DIAGNOSIS — H43813 Vitreous degeneration, bilateral: Secondary | ICD-10-CM | POA: Diagnosis not present

## 2022-09-23 DIAGNOSIS — E1042 Type 1 diabetes mellitus with diabetic polyneuropathy: Secondary | ICD-10-CM | POA: Diagnosis not present

## 2022-09-23 DIAGNOSIS — I10 Essential (primary) hypertension: Secondary | ICD-10-CM | POA: Diagnosis not present

## 2022-09-23 DIAGNOSIS — Z794 Long term (current) use of insulin: Secondary | ICD-10-CM | POA: Diagnosis not present

## 2022-09-23 DIAGNOSIS — M810 Age-related osteoporosis without current pathological fracture: Secondary | ICD-10-CM | POA: Diagnosis not present

## 2022-09-23 DIAGNOSIS — E1022 Type 1 diabetes mellitus with diabetic chronic kidney disease: Secondary | ICD-10-CM | POA: Diagnosis not present

## 2022-09-23 DIAGNOSIS — Z720 Tobacco use: Secondary | ICD-10-CM | POA: Diagnosis not present

## 2022-09-23 DIAGNOSIS — N1831 Chronic kidney disease, stage 3a: Secondary | ICD-10-CM | POA: Diagnosis not present

## 2022-09-29 ENCOUNTER — Other Ambulatory Visit (HOSPITAL_COMMUNITY): Payer: Self-pay | Admitting: Psychiatry

## 2022-09-29 DIAGNOSIS — F411 Generalized anxiety disorder: Secondary | ICD-10-CM

## 2022-09-29 DIAGNOSIS — F4310 Post-traumatic stress disorder, unspecified: Secondary | ICD-10-CM

## 2022-09-30 ENCOUNTER — Encounter: Payer: Self-pay | Admitting: Pulmonary Disease

## 2022-10-02 ENCOUNTER — Encounter (INDEPENDENT_AMBULATORY_CARE_PROVIDER_SITE_OTHER): Payer: 59 | Admitting: Ophthalmology

## 2022-10-02 DIAGNOSIS — E113591 Type 2 diabetes mellitus with proliferative diabetic retinopathy without macular edema, right eye: Secondary | ICD-10-CM

## 2022-10-07 ENCOUNTER — Telehealth (HOSPITAL_BASED_OUTPATIENT_CLINIC_OR_DEPARTMENT_OTHER): Payer: 59 | Admitting: Psychiatry

## 2022-10-07 ENCOUNTER — Encounter (HOSPITAL_COMMUNITY): Payer: Self-pay | Admitting: Psychiatry

## 2022-10-07 ENCOUNTER — Encounter: Payer: Self-pay | Admitting: Pulmonary Disease

## 2022-10-07 DIAGNOSIS — M25512 Pain in left shoulder: Secondary | ICD-10-CM | POA: Diagnosis not present

## 2022-10-07 DIAGNOSIS — R030 Elevated blood-pressure reading, without diagnosis of hypertension: Secondary | ICD-10-CM | POA: Diagnosis not present

## 2022-10-07 DIAGNOSIS — G8929 Other chronic pain: Secondary | ICD-10-CM | POA: Diagnosis not present

## 2022-10-07 DIAGNOSIS — G894 Chronic pain syndrome: Secondary | ICD-10-CM | POA: Diagnosis not present

## 2022-10-07 DIAGNOSIS — R7309 Other abnormal glucose: Secondary | ICD-10-CM | POA: Diagnosis not present

## 2022-10-07 DIAGNOSIS — F411 Generalized anxiety disorder: Secondary | ICD-10-CM

## 2022-10-07 DIAGNOSIS — J449 Chronic obstructive pulmonary disease, unspecified: Secondary | ICD-10-CM | POA: Diagnosis not present

## 2022-10-07 DIAGNOSIS — Z79899 Other long term (current) drug therapy: Secondary | ICD-10-CM | POA: Diagnosis not present

## 2022-10-07 DIAGNOSIS — I10 Essential (primary) hypertension: Secondary | ICD-10-CM | POA: Diagnosis not present

## 2022-10-07 DIAGNOSIS — F17210 Nicotine dependence, cigarettes, uncomplicated: Secondary | ICD-10-CM | POA: Diagnosis not present

## 2022-10-07 DIAGNOSIS — F4310 Post-traumatic stress disorder, unspecified: Secondary | ICD-10-CM | POA: Diagnosis not present

## 2022-10-07 DIAGNOSIS — N183 Chronic kidney disease, stage 3 unspecified: Secondary | ICD-10-CM | POA: Diagnosis not present

## 2022-10-07 DIAGNOSIS — M818 Other osteoporosis without current pathological fracture: Secondary | ICD-10-CM | POA: Diagnosis not present

## 2022-10-07 MED ORDER — MIRTAZAPINE 15 MG PO TABS
15.0000 mg | ORAL_TABLET | Freq: Every day | ORAL | 2 refills | Status: DC
Start: 2022-10-07 — End: 2023-01-06

## 2022-10-07 MED ORDER — ALPRAZOLAM 0.5 MG PO TABS: 0.5000 mg | ORAL_TABLET | Freq: Two times a day (BID) | ORAL | 2 refills | Status: DC | PRN
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
## 2022-10-25 DIAGNOSIS — E1065 Type 1 diabetes mellitus with hyperglycemia: Secondary | ICD-10-CM | POA: Diagnosis not present

## 2022-11-04 DIAGNOSIS — M5412 Radiculopathy, cervical region: Secondary | ICD-10-CM | POA: Diagnosis not present

## 2022-11-04 DIAGNOSIS — M25512 Pain in left shoulder: Secondary | ICD-10-CM | POA: Diagnosis not present

## 2022-11-04 DIAGNOSIS — I10 Essential (primary) hypertension: Secondary | ICD-10-CM | POA: Diagnosis not present

## 2022-11-04 DIAGNOSIS — R030 Elevated blood-pressure reading, without diagnosis of hypertension: Secondary | ICD-10-CM | POA: Diagnosis not present

## 2022-11-04 DIAGNOSIS — G8929 Other chronic pain: Secondary | ICD-10-CM | POA: Diagnosis not present

## 2022-11-04 DIAGNOSIS — R7309 Other abnormal glucose: Secondary | ICD-10-CM | POA: Diagnosis not present

## 2022-11-04 DIAGNOSIS — G894 Chronic pain syndrome: Secondary | ICD-10-CM | POA: Diagnosis not present

## 2022-11-04 DIAGNOSIS — N183 Chronic kidney disease, stage 3 unspecified: Secondary | ICD-10-CM | POA: Diagnosis not present

## 2022-11-04 DIAGNOSIS — M818 Other osteoporosis without current pathological fracture: Secondary | ICD-10-CM | POA: Diagnosis not present

## 2022-11-04 DIAGNOSIS — J449 Chronic obstructive pulmonary disease, unspecified: Secondary | ICD-10-CM | POA: Diagnosis not present

## 2022-11-04 DIAGNOSIS — F17210 Nicotine dependence, cigarettes, uncomplicated: Secondary | ICD-10-CM | POA: Diagnosis not present

## 2022-11-06 ENCOUNTER — Encounter (INDEPENDENT_AMBULATORY_CARE_PROVIDER_SITE_OTHER): Payer: 59 | Admitting: Ophthalmology

## 2022-11-06 DIAGNOSIS — E113591 Type 2 diabetes mellitus with proliferative diabetic retinopathy without macular edema, right eye: Secondary | ICD-10-CM | POA: Diagnosis not present

## 2022-11-06 DIAGNOSIS — H43813 Vitreous degeneration, bilateral: Secondary | ICD-10-CM

## 2022-11-06 DIAGNOSIS — E113512 Type 2 diabetes mellitus with proliferative diabetic retinopathy with macular edema, left eye: Secondary | ICD-10-CM

## 2022-11-06 DIAGNOSIS — Z794 Long term (current) use of insulin: Secondary | ICD-10-CM | POA: Diagnosis not present

## 2022-11-06 DIAGNOSIS — I10 Essential (primary) hypertension: Secondary | ICD-10-CM

## 2022-11-06 DIAGNOSIS — H4312 Vitreous hemorrhage, left eye: Secondary | ICD-10-CM | POA: Diagnosis not present

## 2022-11-06 DIAGNOSIS — H35033 Hypertensive retinopathy, bilateral: Secondary | ICD-10-CM

## 2022-11-29 ENCOUNTER — Other Ambulatory Visit: Payer: Self-pay | Admitting: Internal Medicine

## 2022-11-29 DIAGNOSIS — N183 Chronic kidney disease, stage 3 unspecified: Secondary | ICD-10-CM | POA: Diagnosis not present

## 2022-11-29 DIAGNOSIS — Z122 Encounter for screening for malignant neoplasm of respiratory organs: Secondary | ICD-10-CM

## 2022-11-29 DIAGNOSIS — M810 Age-related osteoporosis without current pathological fracture: Secondary | ICD-10-CM | POA: Diagnosis not present

## 2022-11-29 DIAGNOSIS — J449 Chronic obstructive pulmonary disease, unspecified: Secondary | ICD-10-CM | POA: Diagnosis not present

## 2022-11-29 DIAGNOSIS — Z0000 Encounter for general adult medical examination without abnormal findings: Secondary | ICD-10-CM | POA: Diagnosis not present

## 2022-11-29 DIAGNOSIS — J432 Centrilobular emphysema: Secondary | ICD-10-CM | POA: Diagnosis not present

## 2022-11-29 DIAGNOSIS — E1065 Type 1 diabetes mellitus with hyperglycemia: Secondary | ICD-10-CM | POA: Diagnosis not present

## 2022-11-29 DIAGNOSIS — M159 Polyosteoarthritis, unspecified: Secondary | ICD-10-CM | POA: Diagnosis not present

## 2022-11-29 DIAGNOSIS — E7800 Pure hypercholesterolemia, unspecified: Secondary | ICD-10-CM | POA: Diagnosis not present

## 2022-11-29 DIAGNOSIS — M797 Fibromyalgia: Secondary | ICD-10-CM | POA: Diagnosis not present

## 2022-11-29 DIAGNOSIS — F17200 Nicotine dependence, unspecified, uncomplicated: Secondary | ICD-10-CM | POA: Diagnosis not present

## 2022-11-29 DIAGNOSIS — I700 Atherosclerosis of aorta: Secondary | ICD-10-CM | POA: Diagnosis not present

## 2022-12-07 DIAGNOSIS — G894 Chronic pain syndrome: Secondary | ICD-10-CM | POA: Diagnosis not present

## 2022-12-07 DIAGNOSIS — J449 Chronic obstructive pulmonary disease, unspecified: Secondary | ICD-10-CM | POA: Diagnosis not present

## 2022-12-07 DIAGNOSIS — R030 Elevated blood-pressure reading, without diagnosis of hypertension: Secondary | ICD-10-CM | POA: Diagnosis not present

## 2022-12-07 DIAGNOSIS — M818 Other osteoporosis without current pathological fracture: Secondary | ICD-10-CM | POA: Diagnosis not present

## 2022-12-07 DIAGNOSIS — N183 Chronic kidney disease, stage 3 unspecified: Secondary | ICD-10-CM | POA: Diagnosis not present

## 2022-12-07 DIAGNOSIS — G8929 Other chronic pain: Secondary | ICD-10-CM | POA: Diagnosis not present

## 2022-12-07 DIAGNOSIS — F17210 Nicotine dependence, cigarettes, uncomplicated: Secondary | ICD-10-CM | POA: Diagnosis not present

## 2022-12-07 DIAGNOSIS — I10 Essential (primary) hypertension: Secondary | ICD-10-CM | POA: Diagnosis not present

## 2022-12-07 DIAGNOSIS — M25512 Pain in left shoulder: Secondary | ICD-10-CM | POA: Diagnosis not present

## 2022-12-07 DIAGNOSIS — R7309 Other abnormal glucose: Secondary | ICD-10-CM | POA: Diagnosis not present

## 2022-12-09 DIAGNOSIS — M8580 Other specified disorders of bone density and structure, unspecified site: Secondary | ICD-10-CM | POA: Diagnosis not present

## 2022-12-09 DIAGNOSIS — Z8262 Family history of osteoporosis: Secondary | ICD-10-CM | POA: Diagnosis not present

## 2022-12-09 DIAGNOSIS — M810 Age-related osteoporosis without current pathological fracture: Secondary | ICD-10-CM | POA: Diagnosis not present

## 2022-12-11 ENCOUNTER — Encounter (INDEPENDENT_AMBULATORY_CARE_PROVIDER_SITE_OTHER): Payer: 59 | Admitting: Ophthalmology

## 2022-12-11 DIAGNOSIS — I10 Essential (primary) hypertension: Secondary | ICD-10-CM | POA: Diagnosis not present

## 2022-12-11 DIAGNOSIS — H4312 Vitreous hemorrhage, left eye: Secondary | ICD-10-CM | POA: Diagnosis not present

## 2022-12-11 DIAGNOSIS — H35033 Hypertensive retinopathy, bilateral: Secondary | ICD-10-CM

## 2022-12-11 DIAGNOSIS — E113591 Type 2 diabetes mellitus with proliferative diabetic retinopathy without macular edema, right eye: Secondary | ICD-10-CM

## 2022-12-11 DIAGNOSIS — Z794 Long term (current) use of insulin: Secondary | ICD-10-CM

## 2022-12-11 DIAGNOSIS — E113512 Type 2 diabetes mellitus with proliferative diabetic retinopathy with macular edema, left eye: Secondary | ICD-10-CM

## 2023-01-01 ENCOUNTER — Other Ambulatory Visit (HOSPITAL_COMMUNITY): Payer: Self-pay | Admitting: Psychiatry

## 2023-01-01 DIAGNOSIS — F411 Generalized anxiety disorder: Secondary | ICD-10-CM

## 2023-01-01 DIAGNOSIS — F4310 Post-traumatic stress disorder, unspecified: Secondary | ICD-10-CM

## 2023-01-06 ENCOUNTER — Encounter (HOSPITAL_COMMUNITY): Payer: Self-pay | Admitting: Psychiatry

## 2023-01-06 ENCOUNTER — Telehealth (HOSPITAL_BASED_OUTPATIENT_CLINIC_OR_DEPARTMENT_OTHER): Payer: 59 | Admitting: Psychiatry

## 2023-01-06 VITALS — Wt 130.0 lb

## 2023-01-06 DIAGNOSIS — F4310 Post-traumatic stress disorder, unspecified: Secondary | ICD-10-CM

## 2023-01-06 DIAGNOSIS — F411 Generalized anxiety disorder: Secondary | ICD-10-CM

## 2023-01-06 MED ORDER — ALPRAZOLAM 0.5 MG PO TABS
0.5000 mg | ORAL_TABLET | Freq: Two times a day (BID) | ORAL | 2 refills | Status: DC | PRN
Start: 2023-01-06 — End: 2023-04-08

## 2023-01-06 MED ORDER — MIRTAZAPINE 15 MG PO TABS
15.0000 mg | ORAL_TABLET | Freq: Every day | ORAL | 2 refills | Status: DC
Start: 2023-01-06 — End: 2023-04-08
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
## 2023-01-07 DIAGNOSIS — R030 Elevated blood-pressure reading, without diagnosis of hypertension: Secondary | ICD-10-CM | POA: Diagnosis not present

## 2023-01-07 DIAGNOSIS — G894 Chronic pain syndrome: Secondary | ICD-10-CM | POA: Diagnosis not present

## 2023-01-07 DIAGNOSIS — J449 Chronic obstructive pulmonary disease, unspecified: Secondary | ICD-10-CM | POA: Diagnosis not present

## 2023-01-07 DIAGNOSIS — N183 Chronic kidney disease, stage 3 unspecified: Secondary | ICD-10-CM | POA: Diagnosis not present

## 2023-01-07 DIAGNOSIS — I10 Essential (primary) hypertension: Secondary | ICD-10-CM | POA: Diagnosis not present

## 2023-01-07 DIAGNOSIS — M25512 Pain in left shoulder: Secondary | ICD-10-CM | POA: Diagnosis not present

## 2023-01-07 DIAGNOSIS — F17210 Nicotine dependence, cigarettes, uncomplicated: Secondary | ICD-10-CM | POA: Diagnosis not present

## 2023-01-07 DIAGNOSIS — R7309 Other abnormal glucose: Secondary | ICD-10-CM | POA: Diagnosis not present

## 2023-01-07 DIAGNOSIS — M818 Other osteoporosis without current pathological fracture: Secondary | ICD-10-CM | POA: Diagnosis not present

## 2023-01-07 DIAGNOSIS — E11618 Type 2 diabetes mellitus with other diabetic arthropathy: Secondary | ICD-10-CM | POA: Diagnosis not present

## 2023-01-10 DIAGNOSIS — E1059 Type 1 diabetes mellitus with other circulatory complications: Secondary | ICD-10-CM | POA: Diagnosis not present

## 2023-01-10 DIAGNOSIS — E1042 Type 1 diabetes mellitus with diabetic polyneuropathy: Secondary | ICD-10-CM | POA: Diagnosis not present

## 2023-01-10 DIAGNOSIS — N1831 Chronic kidney disease, stage 3a: Secondary | ICD-10-CM | POA: Diagnosis not present

## 2023-01-10 DIAGNOSIS — Z720 Tobacco use: Secondary | ICD-10-CM | POA: Diagnosis not present

## 2023-01-10 DIAGNOSIS — M8580 Other specified disorders of bone density and structure, unspecified site: Secondary | ICD-10-CM | POA: Diagnosis not present

## 2023-01-10 DIAGNOSIS — I10 Essential (primary) hypertension: Secondary | ICD-10-CM | POA: Diagnosis not present

## 2023-01-10 DIAGNOSIS — Z794 Long term (current) use of insulin: Secondary | ICD-10-CM | POA: Diagnosis not present

## 2023-01-13 ENCOUNTER — Ambulatory Visit
Admission: RE | Admit: 2023-01-13 | Discharge: 2023-01-13 | Disposition: A | Discharge status: Home / Self Care | Payer: 59 | Source: Ambulatory Visit | Attending: Internal Medicine | Admitting: Internal Medicine

## 2023-01-13 DIAGNOSIS — N183 Chronic kidney disease, stage 3 unspecified: Secondary | ICD-10-CM | POA: Diagnosis not present

## 2023-01-13 DIAGNOSIS — Z122 Encounter for screening for malignant neoplasm of respiratory organs: Secondary | ICD-10-CM

## 2023-01-13 DIAGNOSIS — E1042 Type 1 diabetes mellitus with diabetic polyneuropathy: Secondary | ICD-10-CM | POA: Diagnosis not present

## 2023-01-13 DIAGNOSIS — F17210 Nicotine dependence, cigarettes, uncomplicated: Secondary | ICD-10-CM | POA: Diagnosis not present

## 2023-01-13 DIAGNOSIS — E7800 Pure hypercholesterolemia, unspecified: Secondary | ICD-10-CM | POA: Diagnosis not present

## 2023-01-13 DIAGNOSIS — Z1231 Encounter for screening mammogram for malignant neoplasm of breast: Secondary | ICD-10-CM | POA: Diagnosis not present

## 2023-01-13 DIAGNOSIS — N1831 Chronic kidney disease, stage 3a: Secondary | ICD-10-CM | POA: Diagnosis not present

## 2023-01-15 ENCOUNTER — Encounter (INDEPENDENT_AMBULATORY_CARE_PROVIDER_SITE_OTHER): Payer: 59 | Admitting: Ophthalmology

## 2023-01-15 DIAGNOSIS — E1065 Type 1 diabetes mellitus with hyperglycemia: Secondary | ICD-10-CM | POA: Diagnosis not present

## 2023-01-15 DIAGNOSIS — E113512 Type 2 diabetes mellitus with proliferative diabetic retinopathy with macular edema, left eye: Secondary | ICD-10-CM | POA: Diagnosis not present

## 2023-01-15 DIAGNOSIS — I10 Essential (primary) hypertension: Secondary | ICD-10-CM

## 2023-01-15 DIAGNOSIS — E113591 Type 2 diabetes mellitus with proliferative diabetic retinopathy without macular edema, right eye: Secondary | ICD-10-CM | POA: Diagnosis not present

## 2023-01-15 DIAGNOSIS — H35033 Hypertensive retinopathy, bilateral: Secondary | ICD-10-CM | POA: Diagnosis not present

## 2023-01-15 DIAGNOSIS — H4312 Vitreous hemorrhage, left eye: Secondary | ICD-10-CM

## 2023-01-15 DIAGNOSIS — H43813 Vitreous degeneration, bilateral: Secondary | ICD-10-CM | POA: Diagnosis not present

## 2023-02-07 DIAGNOSIS — J449 Chronic obstructive pulmonary disease, unspecified: Secondary | ICD-10-CM | POA: Diagnosis not present

## 2023-02-07 DIAGNOSIS — E11618 Type 2 diabetes mellitus with other diabetic arthropathy: Secondary | ICD-10-CM | POA: Diagnosis not present

## 2023-02-07 DIAGNOSIS — M25512 Pain in left shoulder: Secondary | ICD-10-CM | POA: Diagnosis not present

## 2023-02-07 DIAGNOSIS — G8929 Other chronic pain: Secondary | ICD-10-CM | POA: Diagnosis not present

## 2023-02-07 DIAGNOSIS — F32A Depression, unspecified: Secondary | ICD-10-CM | POA: Diagnosis not present

## 2023-02-07 DIAGNOSIS — Z79899 Other long term (current) drug therapy: Secondary | ICD-10-CM | POA: Diagnosis not present

## 2023-02-07 DIAGNOSIS — N183 Chronic kidney disease, stage 3 unspecified: Secondary | ICD-10-CM | POA: Diagnosis not present

## 2023-02-07 DIAGNOSIS — G894 Chronic pain syndrome: Secondary | ICD-10-CM | POA: Diagnosis not present

## 2023-02-07 DIAGNOSIS — I10 Essential (primary) hypertension: Secondary | ICD-10-CM | POA: Diagnosis not present

## 2023-02-07 DIAGNOSIS — M818 Other osteoporosis without current pathological fracture: Secondary | ICD-10-CM | POA: Diagnosis not present

## 2023-02-26 ENCOUNTER — Encounter (INDEPENDENT_AMBULATORY_CARE_PROVIDER_SITE_OTHER): Payer: 59 | Admitting: Ophthalmology

## 2023-02-26 DIAGNOSIS — H35033 Hypertensive retinopathy, bilateral: Secondary | ICD-10-CM | POA: Diagnosis not present

## 2023-02-26 DIAGNOSIS — E113591 Type 2 diabetes mellitus with proliferative diabetic retinopathy without macular edema, right eye: Secondary | ICD-10-CM

## 2023-02-26 DIAGNOSIS — E113512 Type 2 diabetes mellitus with proliferative diabetic retinopathy with macular edema, left eye: Secondary | ICD-10-CM

## 2023-02-26 DIAGNOSIS — H43813 Vitreous degeneration, bilateral: Secondary | ICD-10-CM | POA: Diagnosis not present

## 2023-02-26 DIAGNOSIS — I10 Essential (primary) hypertension: Secondary | ICD-10-CM | POA: Diagnosis not present

## 2023-03-10 DIAGNOSIS — G894 Chronic pain syndrome: Secondary | ICD-10-CM | POA: Diagnosis not present

## 2023-03-10 DIAGNOSIS — M818 Other osteoporosis without current pathological fracture: Secondary | ICD-10-CM | POA: Diagnosis not present

## 2023-03-10 DIAGNOSIS — J449 Chronic obstructive pulmonary disease, unspecified: Secondary | ICD-10-CM | POA: Diagnosis not present

## 2023-03-10 DIAGNOSIS — M25512 Pain in left shoulder: Secondary | ICD-10-CM | POA: Diagnosis not present

## 2023-03-10 DIAGNOSIS — M5412 Radiculopathy, cervical region: Secondary | ICD-10-CM | POA: Diagnosis not present

## 2023-03-10 DIAGNOSIS — E11618 Type 2 diabetes mellitus with other diabetic arthropathy: Secondary | ICD-10-CM | POA: Diagnosis not present

## 2023-03-10 DIAGNOSIS — N183 Chronic kidney disease, stage 3 unspecified: Secondary | ICD-10-CM | POA: Diagnosis not present

## 2023-03-10 DIAGNOSIS — I10 Essential (primary) hypertension: Secondary | ICD-10-CM | POA: Diagnosis not present

## 2023-03-21 ENCOUNTER — Ambulatory Visit (INDEPENDENT_AMBULATORY_CARE_PROVIDER_SITE_OTHER): Payer: 59 | Admitting: Podiatry

## 2023-03-21 ENCOUNTER — Ambulatory Visit (INDEPENDENT_AMBULATORY_CARE_PROVIDER_SITE_OTHER): Payer: 59

## 2023-03-21 ENCOUNTER — Encounter: Payer: Self-pay | Admitting: Podiatry

## 2023-03-21 VITALS — BP 150/80 | HR 78 | Temp 97.9°F | Resp 18 | Ht <= 58 in | Wt 130.0 lb

## 2023-03-21 DIAGNOSIS — E1140 Type 2 diabetes mellitus with diabetic neuropathy, unspecified: Secondary | ICD-10-CM | POA: Diagnosis not present

## 2023-03-21 DIAGNOSIS — M21619 Bunion of unspecified foot: Secondary | ICD-10-CM | POA: Diagnosis not present

## 2023-03-21 DIAGNOSIS — E1149 Type 2 diabetes mellitus with other diabetic neurological complication: Secondary | ICD-10-CM

## 2023-03-21 DIAGNOSIS — M7751 Other enthesopathy of right foot: Secondary | ICD-10-CM

## 2023-03-21 DIAGNOSIS — S92511A Displaced fracture of proximal phalanx of right lesser toe(s), initial encounter for closed fracture: Secondary | ICD-10-CM | POA: Diagnosis not present

## 2023-03-23 NOTE — Progress Notes (Signed)
Subjective:   Patient ID: [name], female   DOB: 62 y.o.   MRN: [id number]   HPI Patient presents with several problems with 1 being trauma to the right fifth digit with swelling and digital deformity of the right second toe.  Stated she traumatized the right fifth digit last week it has been very sore as well as the patient has had no other changes health history   ROS      Objective:  Physical Exam  Neurovascular status intact inflammation fluid of the right fifth digit with pain and probability for trauma to the bone structure with elevated second digit right probably     Assessment:  Will be for fracture of the right fifth digit with elevation of the second toe right     Bowman:  78 MP x-rays reviewed and discussed fracture at the gradual healing process I have recommended open toed shoes and discussed hammertoe correction do not recommend currently  X-rays indicate there is a fracture of the proximal phalanx digit to right

## 2023-04-05 DIAGNOSIS — E1065 Type 1 diabetes mellitus with hyperglycemia: Secondary | ICD-10-CM | POA: Diagnosis not present

## 2023-04-08 ENCOUNTER — Encounter (HOSPITAL_COMMUNITY): Payer: Self-pay | Admitting: Psychiatry

## 2023-04-08 ENCOUNTER — Telehealth (HOSPITAL_BASED_OUTPATIENT_CLINIC_OR_DEPARTMENT_OTHER): Payer: 59 | Admitting: Psychiatry

## 2023-04-08 DIAGNOSIS — F4310 Post-traumatic stress disorder, unspecified: Secondary | ICD-10-CM

## 2023-04-08 DIAGNOSIS — F411 Generalized anxiety disorder: Secondary | ICD-10-CM

## 2023-04-08 MED ORDER — MIRTAZAPINE 15 MG PO TABS: 15.0000 mg | ORAL_TABLET | Freq: Every day | ORAL | 2 refills | Status: DC

## 2023-04-08 MED ORDER — ALPRAZOLAM 0.5 MG PO TABS
0.5000 mg | ORAL_TABLET | Freq: Two times a day (BID) | ORAL | 2 refills | Status: DC | PRN
Start: 2023-04-08 — End: 2023-07-08
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
## 2023-04-10 DIAGNOSIS — I10 Essential (primary) hypertension: Secondary | ICD-10-CM | POA: Diagnosis not present

## 2023-04-10 DIAGNOSIS — M818 Other osteoporosis without current pathological fracture: Secondary | ICD-10-CM | POA: Diagnosis not present

## 2023-04-10 DIAGNOSIS — N183 Chronic kidney disease, stage 3 unspecified: Secondary | ICD-10-CM | POA: Diagnosis not present

## 2023-04-10 DIAGNOSIS — E11618 Type 2 diabetes mellitus with other diabetic arthropathy: Secondary | ICD-10-CM | POA: Diagnosis not present

## 2023-04-10 DIAGNOSIS — G894 Chronic pain syndrome: Secondary | ICD-10-CM | POA: Diagnosis not present

## 2023-04-10 DIAGNOSIS — J449 Chronic obstructive pulmonary disease, unspecified: Secondary | ICD-10-CM | POA: Diagnosis not present

## 2023-04-10 DIAGNOSIS — M25512 Pain in left shoulder: Secondary | ICD-10-CM | POA: Diagnosis not present

## 2023-04-10 DIAGNOSIS — M545 Low back pain, unspecified: Secondary | ICD-10-CM | POA: Diagnosis not present

## 2023-04-10 DIAGNOSIS — G8929 Other chronic pain: Secondary | ICD-10-CM | POA: Diagnosis not present

## 2023-04-15 ENCOUNTER — Encounter (INDEPENDENT_AMBULATORY_CARE_PROVIDER_SITE_OTHER): Payer: 59 | Admitting: Ophthalmology

## 2023-04-15 DIAGNOSIS — E113512 Type 2 diabetes mellitus with proliferative diabetic retinopathy with macular edema, left eye: Secondary | ICD-10-CM

## 2023-04-15 DIAGNOSIS — E113591 Type 2 diabetes mellitus with proliferative diabetic retinopathy without macular edema, right eye: Secondary | ICD-10-CM | POA: Diagnosis not present

## 2023-04-15 DIAGNOSIS — Z794 Long term (current) use of insulin: Secondary | ICD-10-CM

## 2023-04-15 DIAGNOSIS — I10 Essential (primary) hypertension: Secondary | ICD-10-CM

## 2023-04-15 DIAGNOSIS — H43813 Vitreous degeneration, bilateral: Secondary | ICD-10-CM | POA: Diagnosis not present

## 2023-04-15 DIAGNOSIS — H35033 Hypertensive retinopathy, bilateral: Secondary | ICD-10-CM

## 2023-05-10 DIAGNOSIS — M818 Other osteoporosis without current pathological fracture: Secondary | ICD-10-CM | POA: Diagnosis not present

## 2023-05-10 DIAGNOSIS — M25512 Pain in left shoulder: Secondary | ICD-10-CM | POA: Diagnosis not present

## 2023-05-10 DIAGNOSIS — Z79899 Other long term (current) drug therapy: Secondary | ICD-10-CM | POA: Diagnosis not present

## 2023-05-10 DIAGNOSIS — N183 Chronic kidney disease, stage 3 unspecified: Secondary | ICD-10-CM | POA: Diagnosis not present

## 2023-05-10 DIAGNOSIS — E11618 Type 2 diabetes mellitus with other diabetic arthropathy: Secondary | ICD-10-CM | POA: Diagnosis not present

## 2023-05-10 DIAGNOSIS — G894 Chronic pain syndrome: Secondary | ICD-10-CM | POA: Diagnosis not present

## 2023-05-10 DIAGNOSIS — J449 Chronic obstructive pulmonary disease, unspecified: Secondary | ICD-10-CM | POA: Diagnosis not present

## 2023-05-10 DIAGNOSIS — G8929 Other chronic pain: Secondary | ICD-10-CM | POA: Diagnosis not present

## 2023-05-10 DIAGNOSIS — I10 Essential (primary) hypertension: Secondary | ICD-10-CM | POA: Diagnosis not present

## 2023-05-12 DIAGNOSIS — Z794 Long term (current) use of insulin: Secondary | ICD-10-CM | POA: Diagnosis not present

## 2023-05-12 DIAGNOSIS — E1042 Type 1 diabetes mellitus with diabetic polyneuropathy: Secondary | ICD-10-CM | POA: Diagnosis not present

## 2023-05-12 DIAGNOSIS — J449 Chronic obstructive pulmonary disease, unspecified: Secondary | ICD-10-CM | POA: Diagnosis not present

## 2023-05-12 DIAGNOSIS — I10 Essential (primary) hypertension: Secondary | ICD-10-CM | POA: Diagnosis not present

## 2023-05-12 DIAGNOSIS — Z720 Tobacco use: Secondary | ICD-10-CM | POA: Diagnosis not present

## 2023-05-12 DIAGNOSIS — M8580 Other specified disorders of bone density and structure, unspecified site: Secondary | ICD-10-CM | POA: Diagnosis not present

## 2023-05-12 DIAGNOSIS — E1059 Type 1 diabetes mellitus with other circulatory complications: Secondary | ICD-10-CM | POA: Diagnosis not present

## 2023-05-12 DIAGNOSIS — J441 Chronic obstructive pulmonary disease with (acute) exacerbation: Secondary | ICD-10-CM | POA: Diagnosis not present

## 2023-05-12 DIAGNOSIS — N1831 Chronic kidney disease, stage 3a: Secondary | ICD-10-CM | POA: Diagnosis not present

## 2023-05-15 DIAGNOSIS — Z79899 Other long term (current) drug therapy: Secondary | ICD-10-CM | POA: Diagnosis not present

## 2023-06-03 ENCOUNTER — Encounter (INDEPENDENT_AMBULATORY_CARE_PROVIDER_SITE_OTHER): Payer: Self-pay

## 2023-06-03 ENCOUNTER — Encounter (INDEPENDENT_AMBULATORY_CARE_PROVIDER_SITE_OTHER): Payer: 59 | Admitting: Ophthalmology

## 2023-06-04 ENCOUNTER — Encounter (INDEPENDENT_AMBULATORY_CARE_PROVIDER_SITE_OTHER): Payer: 59 | Admitting: Ophthalmology

## 2023-06-04 DIAGNOSIS — H43813 Vitreous degeneration, bilateral: Secondary | ICD-10-CM

## 2023-06-04 DIAGNOSIS — I10 Essential (primary) hypertension: Secondary | ICD-10-CM | POA: Diagnosis not present

## 2023-06-04 DIAGNOSIS — H35033 Hypertensive retinopathy, bilateral: Secondary | ICD-10-CM

## 2023-06-04 DIAGNOSIS — E113512 Type 2 diabetes mellitus with proliferative diabetic retinopathy with macular edema, left eye: Secondary | ICD-10-CM | POA: Diagnosis not present

## 2023-06-04 DIAGNOSIS — E113591 Type 2 diabetes mellitus with proliferative diabetic retinopathy without macular edema, right eye: Secondary | ICD-10-CM | POA: Diagnosis not present

## 2023-06-04 DIAGNOSIS — Z794 Long term (current) use of insulin: Secondary | ICD-10-CM

## 2023-06-09 DIAGNOSIS — I10 Essential (primary) hypertension: Secondary | ICD-10-CM | POA: Diagnosis not present

## 2023-06-09 DIAGNOSIS — E11618 Type 2 diabetes mellitus with other diabetic arthropathy: Secondary | ICD-10-CM | POA: Diagnosis not present

## 2023-06-09 DIAGNOSIS — Z79899 Other long term (current) drug therapy: Secondary | ICD-10-CM | POA: Diagnosis not present

## 2023-06-09 DIAGNOSIS — G894 Chronic pain syndrome: Secondary | ICD-10-CM | POA: Diagnosis not present

## 2023-06-09 DIAGNOSIS — M25512 Pain in left shoulder: Secondary | ICD-10-CM | POA: Diagnosis not present

## 2023-06-09 DIAGNOSIS — M818 Other osteoporosis without current pathological fracture: Secondary | ICD-10-CM | POA: Diagnosis not present

## 2023-06-09 DIAGNOSIS — N183 Chronic kidney disease, stage 3 unspecified: Secondary | ICD-10-CM | POA: Diagnosis not present

## 2023-06-09 DIAGNOSIS — G8929 Other chronic pain: Secondary | ICD-10-CM | POA: Diagnosis not present

## 2023-06-09 DIAGNOSIS — J449 Chronic obstructive pulmonary disease, unspecified: Secondary | ICD-10-CM | POA: Diagnosis not present

## 2023-06-11 ENCOUNTER — Encounter (INDEPENDENT_AMBULATORY_CARE_PROVIDER_SITE_OTHER): Payer: 59 | Admitting: Ophthalmology

## 2023-06-11 DIAGNOSIS — E113591 Type 2 diabetes mellitus with proliferative diabetic retinopathy without macular edema, right eye: Secondary | ICD-10-CM

## 2023-06-11 DIAGNOSIS — Z794 Long term (current) use of insulin: Secondary | ICD-10-CM

## 2023-06-27 DIAGNOSIS — E1065 Type 1 diabetes mellitus with hyperglycemia: Secondary | ICD-10-CM | POA: Diagnosis not present

## 2023-06-30 ENCOUNTER — Other Ambulatory Visit (HOSPITAL_COMMUNITY): Payer: Self-pay | Admitting: Psychiatry

## 2023-06-30 DIAGNOSIS — F4310 Post-traumatic stress disorder, unspecified: Secondary | ICD-10-CM

## 2023-06-30 DIAGNOSIS — F411 Generalized anxiety disorder: Secondary | ICD-10-CM

## 2023-07-08 ENCOUNTER — Telehealth (HOSPITAL_COMMUNITY): Payer: 59 | Admitting: Psychiatry

## 2023-07-08 ENCOUNTER — Encounter (HOSPITAL_COMMUNITY): Payer: Self-pay | Admitting: Psychiatry

## 2023-07-08 VITALS — Wt 130.0 lb

## 2023-07-08 DIAGNOSIS — F4310 Post-traumatic stress disorder, unspecified: Secondary | ICD-10-CM | POA: Diagnosis not present

## 2023-07-08 DIAGNOSIS — F411 Generalized anxiety disorder: Secondary | ICD-10-CM | POA: Diagnosis not present

## 2023-07-08 MED ORDER — ALPRAZOLAM 0.5 MG PO TABS
0.5000 mg | ORAL_TABLET | Freq: Two times a day (BID) | ORAL | 2 refills | Status: DC | PRN
Start: 2023-07-08 — End: 2023-10-03

## 2023-07-08 MED ORDER — MIRTAZAPINE 15 MG PO TABS: 15.0000 mg | ORAL_TABLET | Freq: Every day | ORAL | 2 refills | Status: DC
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
## 2023-07-10 DIAGNOSIS — G894 Chronic pain syndrome: Secondary | ICD-10-CM | POA: Diagnosis not present

## 2023-07-10 DIAGNOSIS — R030 Elevated blood-pressure reading, without diagnosis of hypertension: Secondary | ICD-10-CM | POA: Diagnosis not present

## 2023-07-10 DIAGNOSIS — M818 Other osteoporosis without current pathological fracture: Secondary | ICD-10-CM | POA: Diagnosis not present

## 2023-07-10 DIAGNOSIS — Z0000 Encounter for general adult medical examination without abnormal findings: Secondary | ICD-10-CM | POA: Diagnosis not present

## 2023-07-10 DIAGNOSIS — E11618 Type 2 diabetes mellitus with other diabetic arthropathy: Secondary | ICD-10-CM | POA: Diagnosis not present

## 2023-07-10 DIAGNOSIS — J449 Chronic obstructive pulmonary disease, unspecified: Secondary | ICD-10-CM | POA: Diagnosis not present

## 2023-07-10 DIAGNOSIS — R7309 Other abnormal glucose: Secondary | ICD-10-CM | POA: Diagnosis not present

## 2023-07-10 DIAGNOSIS — I10 Essential (primary) hypertension: Secondary | ICD-10-CM | POA: Diagnosis not present

## 2023-07-10 DIAGNOSIS — Z79899 Other long term (current) drug therapy: Secondary | ICD-10-CM | POA: Diagnosis not present

## 2023-07-10 DIAGNOSIS — R5383 Other fatigue: Secondary | ICD-10-CM | POA: Diagnosis not present

## 2023-07-10 DIAGNOSIS — F17210 Nicotine dependence, cigarettes, uncomplicated: Secondary | ICD-10-CM | POA: Diagnosis not present

## 2023-07-10 DIAGNOSIS — E7800 Pure hypercholesterolemia, unspecified: Secondary | ICD-10-CM | POA: Diagnosis not present

## 2023-07-10 DIAGNOSIS — M129 Arthropathy, unspecified: Secondary | ICD-10-CM | POA: Diagnosis not present

## 2023-07-15 DIAGNOSIS — Z79899 Other long term (current) drug therapy: Secondary | ICD-10-CM | POA: Diagnosis not present

## 2023-07-25 DIAGNOSIS — J069 Acute upper respiratory infection, unspecified: Secondary | ICD-10-CM | POA: Diagnosis not present

## 2023-07-25 DIAGNOSIS — J441 Chronic obstructive pulmonary disease with (acute) exacerbation: Secondary | ICD-10-CM | POA: Diagnosis not present

## 2023-07-25 DIAGNOSIS — J449 Chronic obstructive pulmonary disease, unspecified: Secondary | ICD-10-CM | POA: Diagnosis not present

## 2023-07-25 DIAGNOSIS — Z03818 Encounter for observation for suspected exposure to other biological agents ruled out: Secondary | ICD-10-CM | POA: Diagnosis not present

## 2023-07-28 DIAGNOSIS — J449 Chronic obstructive pulmonary disease, unspecified: Secondary | ICD-10-CM | POA: Diagnosis not present

## 2023-07-28 DIAGNOSIS — J441 Chronic obstructive pulmonary disease with (acute) exacerbation: Secondary | ICD-10-CM | POA: Diagnosis not present

## 2023-07-28 DIAGNOSIS — J069 Acute upper respiratory infection, unspecified: Secondary | ICD-10-CM | POA: Diagnosis not present

## 2023-07-30 ENCOUNTER — Encounter (INDEPENDENT_AMBULATORY_CARE_PROVIDER_SITE_OTHER): Payer: 59 | Admitting: Ophthalmology

## 2023-08-11 DIAGNOSIS — G894 Chronic pain syndrome: Secondary | ICD-10-CM | POA: Diagnosis not present

## 2023-08-11 DIAGNOSIS — E7800 Pure hypercholesterolemia, unspecified: Secondary | ICD-10-CM | POA: Diagnosis not present

## 2023-08-11 DIAGNOSIS — Z79899 Other long term (current) drug therapy: Secondary | ICD-10-CM | POA: Diagnosis not present

## 2023-08-18 ENCOUNTER — Encounter (INDEPENDENT_AMBULATORY_CARE_PROVIDER_SITE_OTHER): Payer: 59 | Admitting: Ophthalmology

## 2023-08-18 DIAGNOSIS — E113512 Type 2 diabetes mellitus with proliferative diabetic retinopathy with macular edema, left eye: Secondary | ICD-10-CM

## 2023-08-18 DIAGNOSIS — E113591 Type 2 diabetes mellitus with proliferative diabetic retinopathy without macular edema, right eye: Secondary | ICD-10-CM | POA: Diagnosis not present

## 2023-08-18 DIAGNOSIS — H43813 Vitreous degeneration, bilateral: Secondary | ICD-10-CM

## 2023-08-18 DIAGNOSIS — Z794 Long term (current) use of insulin: Secondary | ICD-10-CM | POA: Diagnosis not present

## 2023-08-18 DIAGNOSIS — I10 Essential (primary) hypertension: Secondary | ICD-10-CM

## 2023-08-18 DIAGNOSIS — H35033 Hypertensive retinopathy, bilateral: Secondary | ICD-10-CM

## 2023-08-19 DIAGNOSIS — N1831 Chronic kidney disease, stage 3a: Secondary | ICD-10-CM | POA: Diagnosis not present

## 2023-08-19 DIAGNOSIS — E1059 Type 1 diabetes mellitus with other circulatory complications: Secondary | ICD-10-CM | POA: Diagnosis not present

## 2023-08-19 DIAGNOSIS — Z720 Tobacco use: Secondary | ICD-10-CM | POA: Diagnosis not present

## 2023-08-19 DIAGNOSIS — Z794 Long term (current) use of insulin: Secondary | ICD-10-CM | POA: Diagnosis not present

## 2023-08-19 DIAGNOSIS — I10 Essential (primary) hypertension: Secondary | ICD-10-CM | POA: Diagnosis not present

## 2023-08-19 DIAGNOSIS — M8580 Other specified disorders of bone density and structure, unspecified site: Secondary | ICD-10-CM | POA: Diagnosis not present

## 2023-08-19 DIAGNOSIS — E1042 Type 1 diabetes mellitus with diabetic polyneuropathy: Secondary | ICD-10-CM | POA: Diagnosis not present

## 2023-08-26 DIAGNOSIS — M160 Bilateral primary osteoarthritis of hip: Secondary | ICD-10-CM | POA: Diagnosis not present

## 2023-08-26 DIAGNOSIS — M545 Low back pain, unspecified: Secondary | ICD-10-CM | POA: Diagnosis not present

## 2023-08-27 ENCOUNTER — Other Ambulatory Visit: Payer: Self-pay | Admitting: Orthopedic Surgery

## 2023-08-27 DIAGNOSIS — M545 Low back pain, unspecified: Secondary | ICD-10-CM

## 2023-09-01 DIAGNOSIS — M5412 Radiculopathy, cervical region: Secondary | ICD-10-CM | POA: Diagnosis not present

## 2023-09-01 DIAGNOSIS — E7800 Pure hypercholesterolemia, unspecified: Secondary | ICD-10-CM | POA: Diagnosis not present

## 2023-09-01 DIAGNOSIS — G894 Chronic pain syndrome: Secondary | ICD-10-CM | POA: Diagnosis not present

## 2023-09-01 DIAGNOSIS — Z79899 Other long term (current) drug therapy: Secondary | ICD-10-CM | POA: Diagnosis not present

## 2023-09-07 ENCOUNTER — Ambulatory Visit
Admission: RE | Admit: 2023-09-07 | Discharge: 2023-09-07 | Disposition: A | Discharge status: Home / Self Care | Payer: 59 | Source: Ambulatory Visit | Attending: Orthopedic Surgery | Admitting: Orthopedic Surgery

## 2023-09-07 DIAGNOSIS — M545 Low back pain, unspecified: Secondary | ICD-10-CM

## 2023-09-18 DIAGNOSIS — M545 Low back pain, unspecified: Secondary | ICD-10-CM | POA: Diagnosis not present

## 2023-09-22 DIAGNOSIS — E1065 Type 1 diabetes mellitus with hyperglycemia: Secondary | ICD-10-CM | POA: Diagnosis not present

## 2023-10-01 DIAGNOSIS — E1042 Type 1 diabetes mellitus with diabetic polyneuropathy: Secondary | ICD-10-CM | POA: Diagnosis not present

## 2023-10-01 DIAGNOSIS — Z794 Long term (current) use of insulin: Secondary | ICD-10-CM | POA: Diagnosis not present

## 2023-10-01 DIAGNOSIS — Z92241 Personal history of systemic steroid therapy: Secondary | ICD-10-CM | POA: Diagnosis not present

## 2023-10-01 DIAGNOSIS — R634 Abnormal weight loss: Secondary | ICD-10-CM | POA: Diagnosis not present

## 2023-10-01 DIAGNOSIS — I10 Essential (primary) hypertension: Secondary | ICD-10-CM | POA: Diagnosis not present

## 2023-10-01 DIAGNOSIS — R197 Diarrhea, unspecified: Secondary | ICD-10-CM | POA: Diagnosis not present

## 2023-10-01 DIAGNOSIS — N1831 Chronic kidney disease, stage 3a: Secondary | ICD-10-CM | POA: Diagnosis not present

## 2023-10-01 DIAGNOSIS — E1059 Type 1 diabetes mellitus with other circulatory complications: Secondary | ICD-10-CM | POA: Diagnosis not present

## 2023-10-01 DIAGNOSIS — R110 Nausea: Secondary | ICD-10-CM | POA: Diagnosis not present

## 2023-10-01 DIAGNOSIS — M8580 Other specified disorders of bone density and structure, unspecified site: Secondary | ICD-10-CM | POA: Diagnosis not present

## 2023-10-03 ENCOUNTER — Encounter (HOSPITAL_COMMUNITY): Payer: Self-pay | Admitting: Psychiatry

## 2023-10-03 ENCOUNTER — Telehealth (HOSPITAL_COMMUNITY): Admitting: Psychiatry

## 2023-10-03 VITALS — Wt 130.0 lb

## 2023-10-03 DIAGNOSIS — F411 Generalized anxiety disorder: Secondary | ICD-10-CM | POA: Diagnosis not present

## 2023-10-03 DIAGNOSIS — F330 Major depressive disorder, recurrent, mild: Secondary | ICD-10-CM | POA: Diagnosis not present

## 2023-10-03 DIAGNOSIS — D72820 Lymphocytosis (symptomatic): Secondary | ICD-10-CM | POA: Diagnosis not present

## 2023-10-03 DIAGNOSIS — G894 Chronic pain syndrome: Secondary | ICD-10-CM | POA: Diagnosis not present

## 2023-10-03 DIAGNOSIS — I10 Essential (primary) hypertension: Secondary | ICD-10-CM | POA: Diagnosis not present

## 2023-10-03 DIAGNOSIS — F4310 Post-traumatic stress disorder, unspecified: Secondary | ICD-10-CM

## 2023-10-03 DIAGNOSIS — K5909 Other constipation: Secondary | ICD-10-CM | POA: Diagnosis not present

## 2023-10-03 DIAGNOSIS — F17200 Nicotine dependence, unspecified, uncomplicated: Secondary | ICD-10-CM | POA: Diagnosis not present

## 2023-10-03 MED ORDER — PAROXETINE HCL 20 MG PO TABS
ORAL_TABLET | ORAL | 1 refills | Status: DC
Start: 2023-10-03 — End: 2024-03-03

## 2023-10-03 MED ORDER — ALPRAZOLAM 0.5 MG PO TABS
0.5000 mg | ORAL_TABLET | Freq: Two times a day (BID) | ORAL | 1 refills | Status: DC | PRN
Start: 2023-10-03 — End: 2023-12-03
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
## 2023-10-06 ENCOUNTER — Encounter (INDEPENDENT_AMBULATORY_CARE_PROVIDER_SITE_OTHER): Payer: 59 | Admitting: Ophthalmology

## 2023-10-06 DIAGNOSIS — E113513 Type 2 diabetes mellitus with proliferative diabetic retinopathy with macular edema, bilateral: Secondary | ICD-10-CM | POA: Diagnosis not present

## 2023-10-06 DIAGNOSIS — H43813 Vitreous degeneration, bilateral: Secondary | ICD-10-CM | POA: Diagnosis not present

## 2023-10-06 DIAGNOSIS — I10 Essential (primary) hypertension: Secondary | ICD-10-CM

## 2023-10-06 DIAGNOSIS — H35033 Hypertensive retinopathy, bilateral: Secondary | ICD-10-CM

## 2023-10-06 DIAGNOSIS — Z794 Long term (current) use of insulin: Secondary | ICD-10-CM

## 2023-10-07 DIAGNOSIS — F17210 Nicotine dependence, cigarettes, uncomplicated: Secondary | ICD-10-CM | POA: Diagnosis not present

## 2023-10-07 DIAGNOSIS — Z122 Encounter for screening for malignant neoplasm of respiratory organs: Secondary | ICD-10-CM | POA: Diagnosis not present

## 2023-10-07 DIAGNOSIS — J449 Chronic obstructive pulmonary disease, unspecified: Secondary | ICD-10-CM | POA: Diagnosis not present

## 2023-10-07 DIAGNOSIS — M5412 Radiculopathy, cervical region: Secondary | ICD-10-CM | POA: Diagnosis not present

## 2023-10-07 DIAGNOSIS — E7800 Pure hypercholesterolemia, unspecified: Secondary | ICD-10-CM | POA: Diagnosis not present

## 2023-10-07 DIAGNOSIS — R030 Elevated blood-pressure reading, without diagnosis of hypertension: Secondary | ICD-10-CM | POA: Diagnosis not present

## 2023-10-07 DIAGNOSIS — G894 Chronic pain syndrome: Secondary | ICD-10-CM | POA: Diagnosis not present

## 2023-10-08 DIAGNOSIS — D72820 Lymphocytosis (symptomatic): Secondary | ICD-10-CM | POA: Diagnosis not present

## 2023-10-08 DIAGNOSIS — R197 Diarrhea, unspecified: Secondary | ICD-10-CM | POA: Diagnosis not present

## 2023-10-08 DIAGNOSIS — R112 Nausea with vomiting, unspecified: Secondary | ICD-10-CM | POA: Diagnosis not present

## 2023-10-10 ENCOUNTER — Other Ambulatory Visit (HOSPITAL_COMMUNITY): Payer: Self-pay | Admitting: Psychiatry

## 2023-10-10 DIAGNOSIS — F411 Generalized anxiety disorder: Secondary | ICD-10-CM

## 2023-10-10 DIAGNOSIS — F4310 Post-traumatic stress disorder, unspecified: Secondary | ICD-10-CM

## 2023-10-15 DIAGNOSIS — M8580 Other specified disorders of bone density and structure, unspecified site: Secondary | ICD-10-CM | POA: Diagnosis not present

## 2023-10-15 DIAGNOSIS — E1042 Type 1 diabetes mellitus with diabetic polyneuropathy: Secondary | ICD-10-CM | POA: Diagnosis not present

## 2023-10-15 DIAGNOSIS — I10 Essential (primary) hypertension: Secondary | ICD-10-CM | POA: Diagnosis not present

## 2023-10-15 DIAGNOSIS — N1831 Chronic kidney disease, stage 3a: Secondary | ICD-10-CM | POA: Diagnosis not present

## 2023-10-15 DIAGNOSIS — E1059 Type 1 diabetes mellitus with other circulatory complications: Secondary | ICD-10-CM | POA: Diagnosis not present

## 2023-10-15 DIAGNOSIS — Z794 Long term (current) use of insulin: Secondary | ICD-10-CM | POA: Diagnosis not present

## 2023-11-03 ENCOUNTER — Other Ambulatory Visit: Payer: Self-pay | Admitting: Internal Medicine

## 2023-11-03 DIAGNOSIS — Z794 Long term (current) use of insulin: Secondary | ICD-10-CM | POA: Diagnosis not present

## 2023-11-03 DIAGNOSIS — N1831 Chronic kidney disease, stage 3a: Secondary | ICD-10-CM | POA: Diagnosis not present

## 2023-11-03 DIAGNOSIS — E1059 Type 1 diabetes mellitus with other circulatory complications: Secondary | ICD-10-CM | POA: Diagnosis not present

## 2023-11-03 DIAGNOSIS — R531 Weakness: Secondary | ICD-10-CM | POA: Diagnosis not present

## 2023-11-03 DIAGNOSIS — K921 Melena: Secondary | ICD-10-CM | POA: Diagnosis not present

## 2023-11-03 DIAGNOSIS — Z720 Tobacco use: Secondary | ICD-10-CM | POA: Diagnosis not present

## 2023-11-03 DIAGNOSIS — R519 Headache, unspecified: Secondary | ICD-10-CM

## 2023-11-03 DIAGNOSIS — E1042 Type 1 diabetes mellitus with diabetic polyneuropathy: Secondary | ICD-10-CM | POA: Diagnosis not present

## 2023-11-03 DIAGNOSIS — I10 Essential (primary) hypertension: Secondary | ICD-10-CM | POA: Diagnosis not present

## 2023-11-03 DIAGNOSIS — M8580 Other specified disorders of bone density and structure, unspecified site: Secondary | ICD-10-CM | POA: Diagnosis not present

## 2023-11-05 DIAGNOSIS — G894 Chronic pain syndrome: Secondary | ICD-10-CM | POA: Diagnosis not present

## 2023-11-05 DIAGNOSIS — Z79891 Long term (current) use of opiate analgesic: Secondary | ICD-10-CM | POA: Diagnosis not present

## 2023-11-06 ENCOUNTER — Ambulatory Visit
Admission: RE | Admit: 2023-11-06 | Discharge: 2023-11-06 | Disposition: A | Discharge status: Home / Self Care | Source: Ambulatory Visit | Attending: Internal Medicine | Admitting: Internal Medicine

## 2023-11-06 DIAGNOSIS — R519 Headache, unspecified: Secondary | ICD-10-CM | POA: Diagnosis not present

## 2023-11-07 DIAGNOSIS — E7800 Pure hypercholesterolemia, unspecified: Secondary | ICD-10-CM | POA: Diagnosis not present

## 2023-11-07 DIAGNOSIS — G894 Chronic pain syndrome: Secondary | ICD-10-CM | POA: Diagnosis not present

## 2023-11-07 DIAGNOSIS — I10 Essential (primary) hypertension: Secondary | ICD-10-CM | POA: Diagnosis not present

## 2023-11-17 DIAGNOSIS — Z720 Tobacco use: Secondary | ICD-10-CM | POA: Diagnosis not present

## 2023-11-17 DIAGNOSIS — I10 Essential (primary) hypertension: Secondary | ICD-10-CM | POA: Diagnosis not present

## 2023-11-17 DIAGNOSIS — E1059 Type 1 diabetes mellitus with other circulatory complications: Secondary | ICD-10-CM | POA: Diagnosis not present

## 2023-11-17 DIAGNOSIS — Z794 Long term (current) use of insulin: Secondary | ICD-10-CM | POA: Diagnosis not present

## 2023-11-17 DIAGNOSIS — N1831 Chronic kidney disease, stage 3a: Secondary | ICD-10-CM | POA: Diagnosis not present

## 2023-11-17 DIAGNOSIS — E1042 Type 1 diabetes mellitus with diabetic polyneuropathy: Secondary | ICD-10-CM | POA: Diagnosis not present

## 2023-11-17 DIAGNOSIS — M8580 Other specified disorders of bone density and structure, unspecified site: Secondary | ICD-10-CM | POA: Diagnosis not present

## 2023-12-01 ENCOUNTER — Other Ambulatory Visit (HOSPITAL_COMMUNITY): Payer: Self-pay | Admitting: Psychiatry

## 2023-12-01 ENCOUNTER — Encounter (INDEPENDENT_AMBULATORY_CARE_PROVIDER_SITE_OTHER): Admitting: Ophthalmology

## 2023-12-01 DIAGNOSIS — H43813 Vitreous degeneration, bilateral: Secondary | ICD-10-CM | POA: Diagnosis not present

## 2023-12-01 DIAGNOSIS — F330 Major depressive disorder, recurrent, mild: Secondary | ICD-10-CM

## 2023-12-01 DIAGNOSIS — I10 Essential (primary) hypertension: Secondary | ICD-10-CM | POA: Diagnosis not present

## 2023-12-01 DIAGNOSIS — H35033 Hypertensive retinopathy, bilateral: Secondary | ICD-10-CM

## 2023-12-01 DIAGNOSIS — E113591 Type 2 diabetes mellitus with proliferative diabetic retinopathy without macular edema, right eye: Secondary | ICD-10-CM | POA: Diagnosis not present

## 2023-12-01 DIAGNOSIS — F411 Generalized anxiety disorder: Secondary | ICD-10-CM

## 2023-12-01 DIAGNOSIS — F4310 Post-traumatic stress disorder, unspecified: Secondary | ICD-10-CM

## 2023-12-01 DIAGNOSIS — E113512 Type 2 diabetes mellitus with proliferative diabetic retinopathy with macular edema, left eye: Secondary | ICD-10-CM | POA: Diagnosis not present

## 2023-12-01 DIAGNOSIS — Z794 Long term (current) use of insulin: Secondary | ICD-10-CM

## 2023-12-02 ENCOUNTER — Other Ambulatory Visit: Payer: Self-pay | Admitting: Internal Medicine

## 2023-12-02 ENCOUNTER — Encounter (INDEPENDENT_AMBULATORY_CARE_PROVIDER_SITE_OTHER): Admitting: Ophthalmology

## 2023-12-02 DIAGNOSIS — F17210 Nicotine dependence, cigarettes, uncomplicated: Secondary | ICD-10-CM | POA: Diagnosis not present

## 2023-12-02 DIAGNOSIS — E1042 Type 1 diabetes mellitus with diabetic polyneuropathy: Secondary | ICD-10-CM | POA: Diagnosis not present

## 2023-12-02 DIAGNOSIS — E7800 Pure hypercholesterolemia, unspecified: Secondary | ICD-10-CM | POA: Diagnosis not present

## 2023-12-02 DIAGNOSIS — I2510 Atherosclerotic heart disease of native coronary artery without angina pectoris: Secondary | ICD-10-CM | POA: Diagnosis not present

## 2023-12-02 DIAGNOSIS — Z122 Encounter for screening for malignant neoplasm of respiratory organs: Secondary | ICD-10-CM

## 2023-12-02 DIAGNOSIS — N1831 Chronic kidney disease, stage 3a: Secondary | ICD-10-CM | POA: Diagnosis not present

## 2023-12-02 DIAGNOSIS — Z0000 Encounter for general adult medical examination without abnormal findings: Secondary | ICD-10-CM | POA: Diagnosis not present

## 2023-12-02 DIAGNOSIS — J449 Chronic obstructive pulmonary disease, unspecified: Secondary | ICD-10-CM | POA: Diagnosis not present

## 2023-12-02 DIAGNOSIS — I10 Essential (primary) hypertension: Secondary | ICD-10-CM | POA: Diagnosis not present

## 2023-12-02 DIAGNOSIS — Z794 Long term (current) use of insulin: Secondary | ICD-10-CM | POA: Diagnosis not present

## 2023-12-02 DIAGNOSIS — E1059 Type 1 diabetes mellitus with other circulatory complications: Secondary | ICD-10-CM | POA: Diagnosis not present

## 2023-12-02 DIAGNOSIS — M8589 Other specified disorders of bone density and structure, multiple sites: Secondary | ICD-10-CM | POA: Diagnosis not present

## 2023-12-03 ENCOUNTER — Encounter (HOSPITAL_COMMUNITY): Payer: Self-pay | Admitting: Psychiatry

## 2023-12-03 ENCOUNTER — Telehealth (HOSPITAL_COMMUNITY): Admitting: Psychiatry

## 2023-12-03 VITALS — Wt 131.0 lb

## 2023-12-03 DIAGNOSIS — F330 Major depressive disorder, recurrent, mild: Secondary | ICD-10-CM

## 2023-12-03 DIAGNOSIS — F411 Generalized anxiety disorder: Secondary | ICD-10-CM | POA: Diagnosis not present

## 2023-12-03 DIAGNOSIS — F4310 Post-traumatic stress disorder, unspecified: Secondary | ICD-10-CM | POA: Diagnosis not present

## 2023-12-03 MED ORDER — ESCITALOPRAM OXALATE 10 MG PO TABS
10.0000 mg | ORAL_TABLET | Freq: Every day | ORAL | 1 refills | Status: DC
Start: 2023-12-03 — End: 2024-01-21

## 2023-12-03 MED ORDER — ALPRAZOLAM 0.5 MG PO TABS
0.5000 mg | ORAL_TABLET | Freq: Two times a day (BID) | ORAL | 1 refills | Status: DC | PRN
Start: 2023-12-03 — End: 2024-01-21
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
## 2023-12-05 ENCOUNTER — Telehealth (HOSPITAL_COMMUNITY): Admitting: Psychiatry

## 2023-12-08 DIAGNOSIS — Z79899 Other long term (current) drug therapy: Secondary | ICD-10-CM | POA: Diagnosis not present

## 2023-12-08 DIAGNOSIS — G894 Chronic pain syndrome: Secondary | ICD-10-CM | POA: Diagnosis not present

## 2023-12-08 DIAGNOSIS — I10 Essential (primary) hypertension: Secondary | ICD-10-CM | POA: Diagnosis not present

## 2023-12-12 ENCOUNTER — Telehealth (HOSPITAL_COMMUNITY): Admitting: Psychiatry

## 2023-12-18 DIAGNOSIS — E1065 Type 1 diabetes mellitus with hyperglycemia: Secondary | ICD-10-CM | POA: Diagnosis not present

## 2023-12-25 DIAGNOSIS — L0231 Cutaneous abscess of buttock: Secondary | ICD-10-CM | POA: Diagnosis not present

## 2023-12-25 DIAGNOSIS — L03317 Cellulitis of buttock: Secondary | ICD-10-CM | POA: Diagnosis not present

## 2024-01-02 ENCOUNTER — Other Ambulatory Visit (HOSPITAL_COMMUNITY): Payer: Self-pay | Admitting: Psychiatry

## 2024-01-02 DIAGNOSIS — F4310 Post-traumatic stress disorder, unspecified: Secondary | ICD-10-CM

## 2024-01-02 DIAGNOSIS — F411 Generalized anxiety disorder: Secondary | ICD-10-CM

## 2024-01-02 DIAGNOSIS — F330 Major depressive disorder, recurrent, mild: Secondary | ICD-10-CM

## 2024-01-05 DIAGNOSIS — I10 Essential (primary) hypertension: Secondary | ICD-10-CM | POA: Diagnosis not present

## 2024-01-05 DIAGNOSIS — G894 Chronic pain syndrome: Secondary | ICD-10-CM | POA: Diagnosis not present

## 2024-01-05 DIAGNOSIS — M5412 Radiculopathy, cervical region: Secondary | ICD-10-CM | POA: Diagnosis not present

## 2024-01-05 DIAGNOSIS — M545 Low back pain, unspecified: Secondary | ICD-10-CM | POA: Diagnosis not present

## 2024-01-06 ENCOUNTER — Inpatient Hospital Stay: Attending: Hematology | Admitting: Hematology

## 2024-01-06 ENCOUNTER — Encounter: Payer: Self-pay | Admitting: Hematology

## 2024-01-06 ENCOUNTER — Inpatient Hospital Stay

## 2024-01-06 VITALS — BP 139/68 | HR 75 | Temp 97.8°F | Resp 17 | Ht <= 58 in | Wt 128.0 lb

## 2024-01-06 DIAGNOSIS — Z794 Long term (current) use of insulin: Secondary | ICD-10-CM | POA: Diagnosis not present

## 2024-01-06 DIAGNOSIS — D72820 Lymphocytosis (symptomatic): Secondary | ICD-10-CM

## 2024-01-06 DIAGNOSIS — F419 Anxiety disorder, unspecified: Secondary | ICD-10-CM | POA: Diagnosis not present

## 2024-01-06 DIAGNOSIS — F32A Depression, unspecified: Secondary | ICD-10-CM | POA: Insufficient documentation

## 2024-01-06 DIAGNOSIS — M797 Fibromyalgia: Secondary | ICD-10-CM | POA: Diagnosis not present

## 2024-01-06 DIAGNOSIS — Z803 Family history of malignant neoplasm of breast: Secondary | ICD-10-CM | POA: Diagnosis not present

## 2024-01-06 DIAGNOSIS — F17210 Nicotine dependence, cigarettes, uncomplicated: Secondary | ICD-10-CM | POA: Insufficient documentation

## 2024-01-06 DIAGNOSIS — J449 Chronic obstructive pulmonary disease, unspecified: Secondary | ICD-10-CM | POA: Diagnosis not present

## 2024-01-06 DIAGNOSIS — M1990 Unspecified osteoarthritis, unspecified site: Secondary | ICD-10-CM | POA: Diagnosis not present

## 2024-01-06 DIAGNOSIS — E1165 Type 2 diabetes mellitus with hyperglycemia: Secondary | ICD-10-CM | POA: Insufficient documentation

## 2024-01-06 DIAGNOSIS — Z79899 Other long term (current) drug therapy: Secondary | ICD-10-CM | POA: Diagnosis not present

## 2024-01-06 DIAGNOSIS — Z800 Family history of malignant neoplasm of digestive organs: Secondary | ICD-10-CM | POA: Insufficient documentation

## 2024-01-06 DIAGNOSIS — M069 Rheumatoid arthritis, unspecified: Secondary | ICD-10-CM | POA: Diagnosis not present

## 2024-01-06 DIAGNOSIS — K5909 Other constipation: Secondary | ICD-10-CM | POA: Diagnosis not present

## 2024-01-06 LAB — CBC WITH DIFFERENTIAL/PLATELET
Abs Immature Granulocytes: 0.02 K/uL (ref 0.00–0.07)
Basophils Absolute: 0.1 K/uL (ref 0.0–0.1)
Basophils Relative: 1 %
Eosinophils Absolute: 0.3 K/uL (ref 0.0–0.5)
Eosinophils Relative: 3 %
HCT: 40 % (ref 36.0–46.0)
Hemoglobin: 13.7 g/dL (ref 12.0–15.0)
Immature Granulocytes: 0 %
Lymphocytes Relative: 45 %
Lymphs Abs: 3.9 K/uL (ref 0.7–4.0)
MCH: 30.8 pg (ref 26.0–34.0)
MCHC: 34.3 g/dL (ref 30.0–36.0)
MCV: 89.9 fL (ref 80.0–100.0)
Monocytes Absolute: 0.5 K/uL (ref 0.1–1.0)
Monocytes Relative: 5 %
Neutro Abs: 4 K/uL (ref 1.7–7.7)
Neutrophils Relative %: 46 %
Platelets: 204 K/uL (ref 150–400)
RBC: 4.45 MIL/uL (ref 3.87–5.11)
RDW: 13.9 % (ref 11.5–15.5)
WBC: 8.8 K/uL (ref 4.0–10.5)
nRBC: 0 % (ref 0.0–0.2)

## 2024-01-06 LAB — TECHNOLOGIST SMEAR REVIEW: Plt Morphology: NORMAL

## 2024-01-08 LAB — SURGICAL PATHOLOGY

## 2024-01-08 NOTE — Progress Notes (Signed)
 [facility]   [phone] [phone]   Clinic New Consult Note   Patient Care Team: Dwight Trula SQUIBB, MD as PCP - General (Internal Medicine) Faythe Purchase, MD as Consulting Physician (Endocrinology) 01/08/2024  CHIEF COMPLAINTS/PURPOSE OF CONSULTATION:  Lymphocytosis  REFERRING PHYSICIAN: Dr. Faythe  Discussed the use of AI scribe software for clinical note transcription with the patient, who gave verbal consent to proceed.  History of Present Illness [name] is a 63 year old female who presents with elevated lymphocytes. She was referred by her primary care physician due to concerns about elevated lymphocytes.  She has experienced elevated lymphocytes for approximately eleven years with ALC in 3.5-4.5 range, with recent mildly high levels. Her lymphocyte count recently peaked at 6.1 and decreased after antibiotic treatment. She frequently experiences infections, particularly gastrointestinal, which she associates with her diabetes. Her white blood cell count often increases during these episodes.  She has diabetes managed with insulin  injections and uses the Dexcom G7 for glucose monitoring. Her last A1c was 8.4. She has COPD and has smoked for thirty years, currently up to a pack a day. She takes Lexapro  and Xanax  for anxiety and depression.  Her family history includes breast cancer in her mother and maternal aunt. She is scheduled for a mammogram next week and undergoes annual lung cancer screening.     MEDICAL HISTORY:  Past Medical History:  Diagnosis Date   Anxiety    Arthritis    COPD (chronic obstructive pulmonary disease) (HCC)    Depression    Diabetes mellitus    Diabetic neuropathy (HCC)    Diverticulosis    Drug addiction (HCC) clean for 18 months   cocaine    Fibromyalgia    Full dentures    GERD (gastroesophageal reflux disease)    Hyperlipidemia    Internal hemorrhoids    PONV (postoperative nausea and vomiting)     Rheumatoid arthritis(714.0)    Wears glasses     SURGICAL HISTORY: Past Surgical History:  Procedure Laterality Date   APPENDECTOMY     CATARACT EXTRACTION  2009   bilateral   CESAREAN SECTION     dental surgery  2011   all teeth removed   EYE SURGERY  08,09   both cataracts   HERNIA REPAIR     incisional   KNEE ARTHROSCOPY  Right   KNEE ARTHROSCOPY     lt and rt-2014   rotatator cuff repair  2001   left   TONSILLECTOMY     TRIGGER FINGER RELEASE Right 12/20/2013   Procedure: RIGHT THUMB TRIGGER  RELEASE ;  Surgeon: Franky JONELLE Curia, MD;  Location: [facility];  Service: Orthopedics;  Laterality: Right;   TUBAL LIGATION  2002    SOCIAL HISTORY: Social History   Socioeconomic History   Marital status: Divorced    Spouse name: Not on file   Number of children: 1   Years of education: colleg   Highest education level: Not on file  Occupational History   Occupation: disabled  Tobacco Use   Smoking status: Some Days    Current packs/day: 1.00    Average packs/day: 1 pack/day for 30.5 years (30.5 ttl pk-yrs)    Types: Cigarettes    Start date: 1995   Smokeless tobacco: Never   Tobacco comments:    Pt. smokes 6 a day, trying to quit  Vaping Use   Vaping status: Never Used  Substance and Sexual Activity   Alcohol  use: No  Drug use: Yes    Types: Cocaine     Comment: former cocaine  addiction   Sexual activity: Never  Other Topics Concern   Not on file  Social History Narrative   Left handed and consumes 1-2 cups daily   Social Drivers of Health   Financial Resource Strain: Not on file  Food Insecurity: Food Insecurity Present (01/06/2024)   Hunger Vital Sign    Worried About Running Out of Food in the Last Year: Sometimes true    Ran Out of Food in the Last Year: Often true  Transportation Needs: No Transportation Needs (01/06/2024)   PRAPARE - Administrator, Civil Service (Medical): No    Lack of Transportation (Non-Medical): No   Physical Activity: Not on file  Stress: Not on file  Social Connections: Not on file  Intimate Partner Violence: Not At Risk (01/06/2024)   Humiliation, Afraid, Rape, and Kick questionnaire    Fear of Current or Ex-Partner: No    Emotionally Abused: No    Physically Abused: No    Sexually Abused: No    FAMILY HISTORY: Family History  Problem Relation Age of Onset   Breast cancer Mother    Breast cancer Maternal Aunt    Colon cancer Maternal Grandfather    Diabetes Paternal Grandmother    Heart failure Maternal Grandmother        CHF    ALLERGIES:  is allergic to penicillins, sulfa antibiotics, statins, codeine, and keflet  [cephalexin ].  MEDICATIONS:  Current Outpatient Medications  Medication Sig Dispense Refill   albuterol  (VENTOLIN  HFA) 108 (90 Base) MCG/ACT inhaler Inhale 1 puff into the lungs every 6 (six) hours as needed for wheezing or shortness of breath. 18 g 1   ALPRAZolam  (XANAX ) 0.5 MG tablet Take 1 tablet (0.5 mg total) by mouth 2 (two) times daily as needed for anxiety. 40 tablet 1   amLODipine (NORVASC) 2.5 MG tablet Take 2.5 mg by mouth daily.     aspirin EC 81 MG tablet Take 81 mg by mouth daily.     Coenzyme Q10 100 MG capsule Take 100 mg by mouth daily.     escitalopram  (LEXAPRO ) 10 MG tablet Take 1 tablet (10 mg total) by mouth daily. 30 tablet 1   fluticasone (FLONASE) 50 MCG/ACT nasal spray      HYDROcodone -acetaminophen  (NORCO/VICODIN) 5-325 MG tablet Take 1 tablet by mouth 2 (two) times daily as needed.     influenza vac split quadrivalent PF (FLUARIX QUADRIVALENT) 0.5 ML injection Fluarix Quad 2020-2021 (PF) 60 mcg (15 mcg x 4)/0.5 mL IM syringe  ADM 0.5ML IM UTD     insulin  glargine (LANTUS  SOLOSTAR) 100 UNIT/ML Solostar Pen Lantus  Solostar U-100 Insulin  100 unit/mL (3 mL) subcutaneous pen     insulin  lispro (HUMALOG) 100 UNIT/ML injection Inject into the skin every morning. Insulin  pump-base rate-     LIVALO 4 MG TABS Take 4 mg by mouth daily. Reported  on 11/09/2015  0   losartan (COZAAR) 100 MG tablet Take 100 mg by mouth daily.     mirtazapine  (REMERON ) 15 MG tablet Take 1 tablet (15 mg total) by mouth at bedtime. (Patient not taking: Reported on 12/03/2023) 30 tablet 2   PARoxetine  (PAXIL ) 20 MG tablet Take 1/2 tab daily for one week and than full tab daily (Patient not taking: Reported on 12/03/2023) 30 tablet 1   No current facility-administered medications for this visit.    REVIEW OF SYSTEMS:   Constitutional: Denies fevers, chills or abnormal  night sweats Eyes: Denies blurriness of vision, double vision or watery eyes Ears, nose, mouth, throat, and face: Denies mucositis or sore throat Respiratory: Denies cough, dyspnea or wheezes Cardiovascular: Denies palpitation, chest discomfort or lower extremity swelling Gastrointestinal:  Denies nausea, heartburn or change in bowel habits Skin: Denies abnormal skin rashes Lymphatics: Denies new lymphadenopathy or easy bruising Neurological:Denies numbness, tingling or new weaknesses Behavioral/Psych: Mood is stable, no new changes  All other systems were reviewed with the patient and are negative.  PHYSICAL EXAMINATION: ECOG PERFORMANCE STATUS: 0 - Asymptomatic  Vitals:   01/06/24 1452  BP: 139/68  Pulse: 75  Resp: 17  Temp: 97.8 F (36.6 C)  SpO2: 98%   Filed Weights   01/06/24 1452  Weight: 58.1 kg (128 lb)    GENERAL:alert, no distress and comfortable SKIN: skin color, texture, turgor are normal, no rashes or significant lesions EYES: normal, conjunctiva are pink and non-injected, sclera clear OROPHARYNX:no exudate, no erythema and lips, buccal mucosa, and tongue normal  NECK: supple, thyroid  normal size, non-tender, without nodularity LYMPH:  no palpable lymphadenopathy in the cervical, axillary or inguinal LUNGS: clear to auscultation and percussion with normal breathing effort HEART: regular rate & rhythm and no murmurs and no lower extremity edema ABDOMEN:abdomen soft,  non-tender and normal bowel sounds Musculoskeletal:no cyanosis of digits and no clubbing  PSYCH: alert & oriented x 3 with fluent speech NEURO: no focal motor/sensory deficits  Physical Exam NECK: No large lymph nodes. ABDOMEN: Severe pain in lower abdomen. SKIN: Multiple small brown spots on skin.  LABORATORY DATA:  I have reviewed the data as listed    Latest Ref Rng & Units 01/06/2024    3:29 PM 07/02/2021   12:29 PM 07/02/2021   12:21 PM  CBC  WBC 4.0 - 10.5 K/uL 8.8   10.3   Hemoglobin 12.0 - 15.0 g/dL 86.2  84.9  85.6   Hematocrit 36.0 - 46.0 % 40.0  44.0  44.3   Platelets 150 - 400 K/uL 204   207        Latest Ref Rng & Units 07/02/2021   12:29 PM 07/02/2021   12:21 PM 07/22/2019   12:16 PM  CMP  Glucose 70 - 99 mg/dL 813  802  696   BUN 8 - 23 mg/dL 16  14  14    Creatinine 0.44 - 1.00 mg/dL 9.09  9.00  9.11   Sodium 135 - 145 mmol/L 140  139  137   Potassium 3.5 - 5.1 mmol/L 4.3  4.4  3.7   Chloride 98 - 111 mmol/L 107  106  105   CO2 22 - 32 mmol/L  23  23   Calcium 8.9 - 10.3 mg/dL  9.5  9.3   Total Protein 6.5 - 8.1 g/dL  7.4    Total Bilirubin 0.3 - 1.2 mg/dL  0.8    Alkaline Phos 38 - 126 U/L  55    AST 15 - 41 U/L  26    ALT 0 - 44 U/L  25       RADIOGRAPHIC STUDIES: I have personally reviewed the radiological images as listed and agreed with the findings in the report. No results found.  Assessment & Plan Leukocytosis with mild lymphocytosis Mildly elevated lymphocytes for several years, fluctuating around the upper limit of normal. Recent tests showed lymphocyte counts of 3.5 and 6.1, likely reactive due to infections or chronic inflammation from smoking and COPD. Differential diagnosis includes monoclonal lymphocytosis, a potential pre-leukemia  condition, but current lymphocyte levels are not high enough to suggest chronic lymphocytic leukemia (CLL). - Order flow cytometry to assess for monoclonal lymphocytosis - Review peripheral blood smear - Follow up  with results in 1-2 weeks  Type 2 diabetes mellitus with hyperglycemia Long-standing diabetes with recent A1c of 8.4% due to being off the insulin  pump. Currently managing with insulin  injections.  Chronic obstructive pulmonary disease (COPD) Long history of smoking contributing to COPD. She is motivated to quit smoking to improve health outcomes.  Nicotine dependence, cigarettes Smoking for 30 years, currently up to a pack a day depending on stress levels. Expressed desire to quit smoking with support from her partner.  Depression Currently managed with Lexapro , started one month ago. Also has anxiety and panic attacks.  Fibromyalgia Chronic pain condition.  Osteoarthritis Chronic pain managed with hydrocodone  and under the care of a pain specialist.  Chronic constipation Intermittent constipation with episodes of severe pain and diarrhea. Scheduled to see a GI specialist for further evaluation.  Plan -outside lab reviewed  -will repeat CBC with differentiation, and flow cytometry with CLL panel -will call him to review lab results.   Orders Placed This Encounter  Procedures   CBC with Differential/Platelet    Standing Status:   Standing    Number of Occurrences:   50    Expiration Date:   01/05/2025   Flow Cytometry, Peripheral Blood (Oncology)    CLL panel    Standing Status:   Future    Number of Occurrences:   1    Expected Date:   01/06/2024    Expiration Date:   01/05/2025   Technologist smear review    Clinical information::   lymphocytosis    All questions were answered. The patient knows to call the clinic with any problems, questions or concerns. I spent 25 minutes counseling the patient face to face. The total time spent in the appointment was 30 minutes including review of chart and various tests results, discussions about plan of care and coordination of care plan.     Onita Mattock, MD 01/06/2024

## 2024-01-09 DIAGNOSIS — K5900 Constipation, unspecified: Secondary | ICD-10-CM | POA: Diagnosis not present

## 2024-01-09 DIAGNOSIS — R1030 Lower abdominal pain, unspecified: Secondary | ICD-10-CM | POA: Diagnosis not present

## 2024-01-09 LAB — FLOW CYTOMETRY

## 2024-01-14 DIAGNOSIS — N1831 Chronic kidney disease, stage 3a: Secondary | ICD-10-CM | POA: Diagnosis not present

## 2024-01-14 DIAGNOSIS — E1042 Type 1 diabetes mellitus with diabetic polyneuropathy: Secondary | ICD-10-CM | POA: Diagnosis not present

## 2024-01-14 DIAGNOSIS — Z720 Tobacco use: Secondary | ICD-10-CM | POA: Diagnosis not present

## 2024-01-14 DIAGNOSIS — M8580 Other specified disorders of bone density and structure, unspecified site: Secondary | ICD-10-CM | POA: Diagnosis not present

## 2024-01-14 DIAGNOSIS — I10 Essential (primary) hypertension: Secondary | ICD-10-CM | POA: Diagnosis not present

## 2024-01-14 DIAGNOSIS — Z794 Long term (current) use of insulin: Secondary | ICD-10-CM | POA: Diagnosis not present

## 2024-01-14 DIAGNOSIS — E1059 Type 1 diabetes mellitus with other circulatory complications: Secondary | ICD-10-CM | POA: Diagnosis not present

## 2024-01-19 ENCOUNTER — Encounter (INDEPENDENT_AMBULATORY_CARE_PROVIDER_SITE_OTHER): Admitting: Ophthalmology

## 2024-01-20 ENCOUNTER — Encounter (HOSPITAL_COMMUNITY): Payer: Self-pay

## 2024-01-21 ENCOUNTER — Other Ambulatory Visit (HOSPITAL_COMMUNITY): Payer: Self-pay

## 2024-01-21 DIAGNOSIS — F4310 Post-traumatic stress disorder, unspecified: Secondary | ICD-10-CM

## 2024-01-21 DIAGNOSIS — F330 Major depressive disorder, recurrent, mild: Secondary | ICD-10-CM

## 2024-01-21 DIAGNOSIS — F411 Generalized anxiety disorder: Secondary | ICD-10-CM

## 2024-01-21 MED ORDER — ESCITALOPRAM OXALATE 10 MG PO TABS: 10.0000 mg | ORAL_TABLET | Freq: Every day | ORAL | 0 refills | Status: DC

## 2024-01-21 MED ORDER — ALPRAZOLAM 0.5 MG PO TABS: 0.5000 mg | ORAL_TABLET | Freq: Two times a day (BID) | ORAL | 0 refills | Status: DC | PRN

## 2024-01-23 ENCOUNTER — Ambulatory Visit: Payer: Self-pay | Admitting: Hematology

## 2024-01-28 DIAGNOSIS — K5900 Constipation, unspecified: Secondary | ICD-10-CM | POA: Diagnosis not present

## 2024-01-28 DIAGNOSIS — K5730 Diverticulosis of large intestine without perforation or abscess without bleeding: Secondary | ICD-10-CM | POA: Diagnosis not present

## 2024-01-28 DIAGNOSIS — R1032 Left lower quadrant pain: Secondary | ICD-10-CM | POA: Diagnosis not present

## 2024-01-28 DIAGNOSIS — K6289 Other specified diseases of anus and rectum: Secondary | ICD-10-CM | POA: Diagnosis not present

## 2024-01-28 DIAGNOSIS — R1031 Right lower quadrant pain: Secondary | ICD-10-CM | POA: Diagnosis not present

## 2024-01-28 DIAGNOSIS — K648 Other hemorrhoids: Secondary | ICD-10-CM | POA: Diagnosis not present

## 2024-01-30 NOTE — Telephone Encounter (Addendum)
 Called patient to relay message below as per Dr. Lanny, no answer to call left below message on voice mail. Also left call back number if the patient had any further questions.    ----- Message from Onita Lanny sent at 01/23/2024 10:33 PM EDT ----- Please let pt know that her CBC and diff was normal and flow cytometry showed no evidence of CLL, no need to see me back, thanks   Onita Lanny  ----- Message ----- From: Rebecka, [facility] [place] Sent: 01/06/2024   3:46 PM EDT To: Onita Lanny, MD

## 2024-02-04 ENCOUNTER — Encounter (INDEPENDENT_AMBULATORY_CARE_PROVIDER_SITE_OTHER): Admitting: Ophthalmology

## 2024-02-04 DIAGNOSIS — H43813 Vitreous degeneration, bilateral: Secondary | ICD-10-CM | POA: Diagnosis not present

## 2024-02-04 DIAGNOSIS — E113513 Type 2 diabetes mellitus with proliferative diabetic retinopathy with macular edema, bilateral: Secondary | ICD-10-CM

## 2024-02-04 DIAGNOSIS — Z794 Long term (current) use of insulin: Secondary | ICD-10-CM

## 2024-02-04 DIAGNOSIS — I10 Essential (primary) hypertension: Secondary | ICD-10-CM

## 2024-02-04 DIAGNOSIS — H35033 Hypertensive retinopathy, bilateral: Secondary | ICD-10-CM

## 2024-02-06 DIAGNOSIS — F17210 Nicotine dependence, cigarettes, uncomplicated: Secondary | ICD-10-CM | POA: Diagnosis not present

## 2024-02-06 DIAGNOSIS — I10 Essential (primary) hypertension: Secondary | ICD-10-CM | POA: Diagnosis not present

## 2024-02-06 DIAGNOSIS — E11618 Type 2 diabetes mellitus with other diabetic arthropathy: Secondary | ICD-10-CM | POA: Diagnosis not present

## 2024-02-06 DIAGNOSIS — M5412 Radiculopathy, cervical region: Secondary | ICD-10-CM | POA: Diagnosis not present

## 2024-02-06 DIAGNOSIS — M545 Low back pain, unspecified: Secondary | ICD-10-CM | POA: Diagnosis not present

## 2024-02-06 DIAGNOSIS — G894 Chronic pain syndrome: Secondary | ICD-10-CM | POA: Diagnosis not present

## 2024-02-10 DIAGNOSIS — J069 Acute upper respiratory infection, unspecified: Secondary | ICD-10-CM | POA: Diagnosis not present

## 2024-02-10 DIAGNOSIS — J441 Chronic obstructive pulmonary disease with (acute) exacerbation: Secondary | ICD-10-CM | POA: Diagnosis not present

## 2024-02-10 DIAGNOSIS — J449 Chronic obstructive pulmonary disease, unspecified: Secondary | ICD-10-CM | POA: Diagnosis not present

## 2024-02-11 ENCOUNTER — Encounter (INDEPENDENT_AMBULATORY_CARE_PROVIDER_SITE_OTHER): Admitting: Ophthalmology

## 2024-03-03 ENCOUNTER — Encounter (HOSPITAL_COMMUNITY): Payer: Self-pay | Admitting: Psychiatry

## 2024-03-03 ENCOUNTER — Telehealth (HOSPITAL_BASED_OUTPATIENT_CLINIC_OR_DEPARTMENT_OTHER): Admitting: Psychiatry

## 2024-03-03 VITALS — Wt 128.0 lb

## 2024-03-03 DIAGNOSIS — F411 Generalized anxiety disorder: Secondary | ICD-10-CM | POA: Diagnosis not present

## 2024-03-03 DIAGNOSIS — F4310 Post-traumatic stress disorder, unspecified: Secondary | ICD-10-CM | POA: Diagnosis not present

## 2024-03-03 DIAGNOSIS — F330 Major depressive disorder, recurrent, mild: Secondary | ICD-10-CM

## 2024-03-03 MED ORDER — ESCITALOPRAM OXALATE 10 MG PO TABS: 10.0000 mg | ORAL_TABLET | Freq: Every day | ORAL | 2 refills | Status: DC

## 2024-03-03 MED ORDER — ALPRAZOLAM 0.5 MG PO TABS: 0.5000 mg | ORAL_TABLET | Freq: Two times a day (BID) | ORAL | 2 refills | Status: DC | PRN

## 2024-03-03 MED ORDER — ALPRAZOLAM 0.5 MG PO TABS
0.5000 mg | ORAL_TABLET | Freq: Two times a day (BID) | ORAL | 2 refills | Status: DC | PRN
Start: 2024-03-03 — End: 2024-03-03
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
## 2024-03-15 DIAGNOSIS — J449 Chronic obstructive pulmonary disease, unspecified: Secondary | ICD-10-CM | POA: Diagnosis not present

## 2024-03-15 DIAGNOSIS — R29818 Other symptoms and signs involving the nervous system: Secondary | ICD-10-CM | POA: Diagnosis not present

## 2024-03-15 DIAGNOSIS — G894 Chronic pain syndrome: Secondary | ICD-10-CM | POA: Diagnosis not present

## 2024-03-15 DIAGNOSIS — I10 Essential (primary) hypertension: Secondary | ICD-10-CM | POA: Diagnosis not present

## 2024-03-15 DIAGNOSIS — Z79899 Other long term (current) drug therapy: Secondary | ICD-10-CM | POA: Diagnosis not present

## 2024-03-15 DIAGNOSIS — N183 Chronic kidney disease, stage 3 unspecified: Secondary | ICD-10-CM | POA: Diagnosis not present

## 2024-03-31 ENCOUNTER — Encounter (INDEPENDENT_AMBULATORY_CARE_PROVIDER_SITE_OTHER): Admitting: Ophthalmology

## 2024-03-31 DIAGNOSIS — Z794 Long term (current) use of insulin: Secondary | ICD-10-CM | POA: Diagnosis not present

## 2024-03-31 DIAGNOSIS — H4312 Vitreous hemorrhage, left eye: Secondary | ICD-10-CM

## 2024-03-31 DIAGNOSIS — H35033 Hypertensive retinopathy, bilateral: Secondary | ICD-10-CM

## 2024-03-31 DIAGNOSIS — H43813 Vitreous degeneration, bilateral: Secondary | ICD-10-CM

## 2024-03-31 DIAGNOSIS — I10 Essential (primary) hypertension: Secondary | ICD-10-CM

## 2024-03-31 DIAGNOSIS — E113593 Type 2 diabetes mellitus with proliferative diabetic retinopathy without macular edema, bilateral: Secondary | ICD-10-CM

## 2024-04-12 DIAGNOSIS — H04123 Dry eye syndrome of bilateral lacrimal glands: Secondary | ICD-10-CM | POA: Diagnosis not present

## 2024-04-12 DIAGNOSIS — H04563 Stenosis of bilateral lacrimal punctum: Secondary | ICD-10-CM | POA: Diagnosis not present

## 2024-04-12 DIAGNOSIS — H02831 Dermatochalasis of right upper eyelid: Secondary | ICD-10-CM | POA: Diagnosis not present

## 2024-04-12 DIAGNOSIS — E113513 Type 2 diabetes mellitus with proliferative diabetic retinopathy with macular edema, bilateral: Secondary | ICD-10-CM | POA: Diagnosis not present

## 2024-04-14 DIAGNOSIS — J449 Chronic obstructive pulmonary disease, unspecified: Secondary | ICD-10-CM | POA: Diagnosis not present

## 2024-04-14 DIAGNOSIS — G894 Chronic pain syndrome: Secondary | ICD-10-CM | POA: Diagnosis not present

## 2024-04-14 DIAGNOSIS — E11618 Type 2 diabetes mellitus with other diabetic arthropathy: Secondary | ICD-10-CM | POA: Diagnosis not present

## 2024-04-14 DIAGNOSIS — I10 Essential (primary) hypertension: Secondary | ICD-10-CM | POA: Diagnosis not present

## 2024-04-14 DIAGNOSIS — Z79899 Other long term (current) drug therapy: Secondary | ICD-10-CM | POA: Diagnosis not present

## 2024-04-14 DIAGNOSIS — F17210 Nicotine dependence, cigarettes, uncomplicated: Secondary | ICD-10-CM | POA: Diagnosis not present

## 2024-04-16 DIAGNOSIS — J441 Chronic obstructive pulmonary disease with (acute) exacerbation: Secondary | ICD-10-CM | POA: Diagnosis not present

## 2024-04-28 DIAGNOSIS — M8580 Other specified disorders of bone density and structure, unspecified site: Secondary | ICD-10-CM | POA: Diagnosis not present

## 2024-04-28 DIAGNOSIS — Z794 Long term (current) use of insulin: Secondary | ICD-10-CM | POA: Diagnosis not present

## 2024-04-28 DIAGNOSIS — N1831 Chronic kidney disease, stage 3a: Secondary | ICD-10-CM | POA: Diagnosis not present

## 2024-04-28 DIAGNOSIS — I10 Essential (primary) hypertension: Secondary | ICD-10-CM | POA: Diagnosis not present

## 2024-04-28 DIAGNOSIS — E1059 Type 1 diabetes mellitus with other circulatory complications: Secondary | ICD-10-CM | POA: Diagnosis not present

## 2024-04-28 DIAGNOSIS — E1042 Type 1 diabetes mellitus with diabetic polyneuropathy: Secondary | ICD-10-CM | POA: Diagnosis not present

## 2024-05-13 ENCOUNTER — Ambulatory Visit
Admission: RE | Admit: 2024-05-13 | Discharge: 2024-05-13 | Disposition: A | Discharge status: Home / Self Care | Source: Ambulatory Visit | Attending: Internal Medicine | Admitting: Internal Medicine

## 2024-05-13 DIAGNOSIS — Z122 Encounter for screening for malignant neoplasm of respiratory organs: Secondary | ICD-10-CM

## 2024-05-24 ENCOUNTER — Other Ambulatory Visit: Payer: Self-pay | Admitting: Internal Medicine

## 2024-05-24 DIAGNOSIS — Z122 Encounter for screening for malignant neoplasm of respiratory organs: Secondary | ICD-10-CM

## 2024-05-26 ENCOUNTER — Encounter (INDEPENDENT_AMBULATORY_CARE_PROVIDER_SITE_OTHER): Admitting: Ophthalmology

## 2024-05-26 DIAGNOSIS — Z794 Long term (current) use of insulin: Secondary | ICD-10-CM | POA: Diagnosis not present

## 2024-05-26 DIAGNOSIS — H4312 Vitreous hemorrhage, left eye: Secondary | ICD-10-CM

## 2024-05-26 DIAGNOSIS — I10 Essential (primary) hypertension: Secondary | ICD-10-CM

## 2024-05-26 DIAGNOSIS — E113512 Type 2 diabetes mellitus with proliferative diabetic retinopathy with macular edema, left eye: Secondary | ICD-10-CM

## 2024-05-26 DIAGNOSIS — H35033 Hypertensive retinopathy, bilateral: Secondary | ICD-10-CM

## 2024-05-26 DIAGNOSIS — E113591 Type 2 diabetes mellitus with proliferative diabetic retinopathy without macular edema, right eye: Secondary | ICD-10-CM

## 2024-05-26 DIAGNOSIS — H43813 Vitreous degeneration, bilateral: Secondary | ICD-10-CM

## 2024-06-02 ENCOUNTER — Encounter (HOSPITAL_COMMUNITY): Payer: Self-pay | Admitting: Psychiatry

## 2024-06-02 ENCOUNTER — Telehealth (HOSPITAL_COMMUNITY): Admitting: Psychiatry

## 2024-06-02 VITALS — Wt 128.0 lb

## 2024-06-02 DIAGNOSIS — F330 Major depressive disorder, recurrent, mild: Secondary | ICD-10-CM | POA: Diagnosis not present

## 2024-06-02 DIAGNOSIS — F4310 Post-traumatic stress disorder, unspecified: Secondary | ICD-10-CM | POA: Diagnosis not present

## 2024-06-02 DIAGNOSIS — F411 Generalized anxiety disorder: Secondary | ICD-10-CM

## 2024-06-02 MED ORDER — ALPRAZOLAM 0.5 MG PO TABS: 0.5000 mg | ORAL_TABLET | Freq: Two times a day (BID) | ORAL | 2 refills | Status: AC | PRN

## 2024-06-02 MED ORDER — ESCITALOPRAM OXALATE 10 MG PO TABS: 10.0000 mg | ORAL_TABLET | Freq: Every day | ORAL | 2 refills | Status: AC
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
## 2024-06-03 ENCOUNTER — Other Ambulatory Visit: Payer: Self-pay | Admitting: Internal Medicine

## 2024-06-03 DIAGNOSIS — K625 Hemorrhage of anus and rectum: Secondary | ICD-10-CM

## 2024-06-03 DIAGNOSIS — J849 Interstitial pulmonary disease, unspecified: Secondary | ICD-10-CM

## 2024-06-22 ENCOUNTER — Ambulatory Visit
Admission: RE | Admit: 2024-06-22 | Discharge: 2024-06-22 | Disposition: A | Discharge status: Home / Self Care | Source: Ambulatory Visit | Attending: Internal Medicine | Admitting: Internal Medicine

## 2024-06-22 DIAGNOSIS — J849 Interstitial pulmonary disease, unspecified: Secondary | ICD-10-CM

## 2024-06-22 DIAGNOSIS — K625 Hemorrhage of anus and rectum: Secondary | ICD-10-CM

## 2024-07-13 ENCOUNTER — Encounter (HOSPITAL_BASED_OUTPATIENT_CLINIC_OR_DEPARTMENT_OTHER): Payer: Self-pay

## 2024-07-22 ENCOUNTER — Encounter (HOSPITAL_COMMUNITY): Payer: Self-pay

## 2024-07-22 ENCOUNTER — Ambulatory Visit (INDEPENDENT_AMBULATORY_CARE_PROVIDER_SITE_OTHER)

## 2024-07-22 ENCOUNTER — Ambulatory Visit (HOSPITAL_COMMUNITY)
Admission: EM | Admit: 2024-07-22 | Discharge: 2024-07-22 | Disposition: A | Discharge status: Home / Self Care | Attending: Family Medicine | Admitting: Family Medicine

## 2024-07-22 DIAGNOSIS — S92301A Fracture of unspecified metatarsal bone(s), right foot, initial encounter for closed fracture: Secondary | ICD-10-CM | POA: Diagnosis not present

## 2024-07-22 DIAGNOSIS — M25571 Pain in right ankle and joints of right foot: Secondary | ICD-10-CM

## 2024-07-22 NOTE — Discharge Instructions (Addendum)
 Your foot injury yesterday resulted in 3 mild fractures of some of the bones in your foot. You were placed in a walking boot to keep the bones immobilized while they heal. You will need to follow-up with an orthopedic provider.  I have listed the phone number and address for both [facility] and the [facility].  Either 1 of these providers is fine. You will most likely be in the walking boot for 4 to 6 weeks. Please use your rollator at home as needed for safety while you are walking. You may continue your home pain regimen for pain.
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
## 2024-07-22 NOTE — ED Provider Notes (Signed)
 " [facility]    CSN: [id number] Arrival date & time: 07/22/24  1326      History   Chief Complaint Chief Complaint  Patient presents with   Foot Injury    HPI Winni Ehrhard is a 64 y.o. female.   The patient, with diabetes and neuropathy, presents with foot pain and swelling following a recent injury.  Foot pain and swelling - Onset December 23 after a fall while exiting a car, resulting in black and blue discoloration and swelling of the foot - Suspected broken pinky toe; toes taped together - Swelling decreased somewhat and able to walk without pinky toe pain - Reinjury yesterday when striking foot on a dresser after a cat ran in front of them - Since reinjury, increased, constant, severe pain and swelling around toes and plantar foot - Pain now limits ability to wear shoes and walk; only able to ambulate on heel - Swelling has not significantly improved despite elevation  Diabetes and neuropathy - Diabetes with neuropathy resulting in reduced foot sensation  Arthritis - Arthritis in the foot contributes to difficulty with movement  Pain management - Took Vicodin 10 mg / 325 mg Tylenol  about one hour prior to visit for pain control  Additional relevant history - Not on blood thinners - No history of gout  The history is provided by the patient. No language interpreter was used.    Past Medical History:  Diagnosis Date   Anxiety    Arthritis    COPD (chronic obstructive pulmonary disease) (HCC)    Depression    Diabetes mellitus    Diabetic neuropathy (HCC)    Diverticulosis    Drug addiction (HCC) clean for 18 months   cocaine    Fibromyalgia    Full dentures    GERD (gastroesophageal reflux disease)    Hyperlipidemia    Internal hemorrhoids    PONV (postoperative nausea and vomiting)    Rheumatoid arthritis(714.0)    Wears glasses     Patient Active Problem List   Diagnosis Date Noted   Senile osteoporosis 01/14/2022   Pain in joint  of left shoulder 03/30/2019   Pain in right knee 03/11/2019   Pseudophakia 09/03/2018   Encounter for long-term (current) use of insulin  (HCC) 09/03/2018   Diabetes mellitus without complication (HCC) 09/03/2018   Diverticulitis 04/28/2015   Dysphagia 03/14/2014   Stroke (HCC) 03/13/2014   Dizziness 03/13/2014   Diabetes mellitus type 2, uncontrolled 03/13/2014   COPD (chronic obstructive pulmonary disease) (HCC) 03/13/2014   Chest pain, atypical 03/13/2014   Low back pain radiating to right lower extremity 03/02/2014    Past Surgical History:  Procedure Laterality Date   APPENDECTOMY     CATARACT EXTRACTION  2009   bilateral   CESAREAN SECTION     dental surgery  2011   all teeth removed   EYE SURGERY  08,09   both cataracts   HERNIA REPAIR     incisional   KNEE ARTHROSCOPY  Right   KNEE ARTHROSCOPY     lt and rt-2014   rotatator cuff repair  2001   left   TONSILLECTOMY     TRIGGER FINGER RELEASE Right 12/20/2013   Procedure: RIGHT THUMB TRIGGER  RELEASE ;  Surgeon: Franky JONELLE Curia, MD;  Location: [facility];  Service: Orthopedics;  Laterality: Right;   TUBAL LIGATION  2002    OB History   No obstetric history on file.      Home Medications  Prior to Admission medications  Medication Sig Start Date End Date Taking? Authorizing Provider  albuterol  (VENTOLIN  HFA) 108 (90 Base) MCG/ACT inhaler Inhale 1 puff into the lungs every 6 (six) hours as needed for wheezing or shortness of breath. 07/22/19   Towana Ozell BROCKS, MD  ALPRAZolam  (XANAX ) 0.5 MG tablet Take 1 tablet (0.5 mg total) by mouth 2 (two) times daily as needed for anxiety. 06/02/24 06/02/25  Arfeen, Leni DASEN, MD  amLODipine (NORVASC) 2.5 MG tablet Take 2.5 mg by mouth daily. 06/10/22   Faythe Purchase, MD  aspirin EC 81 MG tablet Take 81 mg by mouth daily.    [provider]  Coenzyme Q10 100 MG capsule Take 100 mg by mouth daily.    [provider]  escitalopram  (LEXAPRO ) 10 MG  tablet Take 1 tablet (10 mg total) by mouth daily. 06/02/24 08/31/24  Arfeen, Syed T, MD  fluticasone OREN) 50 MCG/ACT nasal spray  06/01/18   [provider]  HYDROcodone -acetaminophen  (NORCO/VICODIN) 5-325 MG tablet Take 1 tablet by mouth 2 (two) times daily as needed. 11/22/19   Bujanowski, Melissa, PA-C  influenza vac split quadrivalent PF (FLUARIX QUADRIVALENT) 0.5 ML injection Fluarix Quad 2020-2021 (PF) 60 mcg (15 mcg x 4)/0.5 mL IM syringe  ADM 0.5ML IM UTD    Faythe Purchase, MD  insulin  glargine (LANTUS  SOLOSTAR) 100 UNIT/ML Solostar Pen Lantus  Solostar U-100 Insulin  100 unit/mL (3 mL) subcutaneous pen    Bujanowski, Melissa, PA-C  insulin  lispro (HUMALOG) 100 UNIT/ML injection Inject into the skin every morning. Insulin  pump-base rate-    [provider]  LIVALO 4 MG TABS Take 4 mg by mouth daily. Reported on 11/09/2015 02/10/15   [provider]  losartan (COZAAR) 100 MG tablet Take 100 mg by mouth daily. 06/15/22   Faythe Purchase, MD    Family History Family History  Problem Relation Age of Onset   Breast cancer Mother    Breast cancer Maternal Aunt    Colon cancer Maternal Grandfather    Diabetes Paternal Grandmother    Heart failure Maternal Grandmother        CHF    Social History Social History[1]   Allergies   Sulfa antibiotics, Statins, and Keflet  [cephalexin ]   Review of Systems Review of Systems   Physical Exam Triage Vital Signs ED Triage Vitals  Encounter Vitals Group     BP 07/22/24 1426 135/78     Girls Systolic BP Percentile --      Girls Diastolic BP Percentile --      Boys Systolic BP Percentile --      Boys Diastolic BP Percentile --      Pulse Rate 07/22/24 1426 81     Resp 07/22/24 1426 17     Temp 07/22/24 1426 97.9 F (36.6 C)     Temp Source 07/22/24 1426 Oral     SpO2 07/22/24 1426 97 %     Weight --      Height --      Head Circumference --      Peak Flow --      Pain Score 07/22/24 1423 8     Pain Loc --       Pain Education --      Exclude from Growth Chart --    No data found.  Updated Vital Signs BP 135/78 (BP Location: Left Arm)   Pulse 81   Temp 97.9 F (36.6 C) (Oral)   Resp 17   SpO2 97%  Visual Acuity Right Eye Distance:   Left Eye Distance:   Bilateral Distance:    Right Eye Near:   Left Eye Near:    Bilateral Near:     Physical Exam Vitals and nursing note reviewed.  Constitutional:      General: She is not in acute distress.    Appearance: She is well-developed.  HENT:     Head: Normocephalic and atraumatic.  Eyes:     Conjunctiva/sclera: Conjunctivae normal.  Cardiovascular:     Rate and Rhythm: Normal rate and regular rhythm.     Heart sounds: No murmur heard. Pulmonary:     Effort: Pulmonary effort is normal. No respiratory distress.     Breath sounds: Normal breath sounds.  Abdominal:     Palpations: Abdomen is soft.     Tenderness: There is no abdominal tenderness.  Musculoskeletal:     Cervical back: Neck supple.     Right foot: Normal capillary refill. Tenderness (Metatarsal heads) present. No crepitus. Normal pulse.     Comments: Mild erythema around the nailbed of the fourth toe Bruising present on the plantar aspect of the fourth toe Range of motion of the 4th and 5th toes limited Is able to flex up and down.  This reproduces some pain. Mild swelling present at the plantar aspect of the metatarsal heads Full range of motion of the ankle Neurovascularly intact   Skin:    General: Skin is warm and dry.     Capillary Refill: Capillary refill takes less than 2 seconds.  Neurological:     Mental Status: She is alert.  Psychiatric:        Mood and Affect: Mood normal.      UC Treatments / Results  Labs (all labs ordered are listed, but only abnormal results are displayed) Labs Reviewed - No data to display  EKG   Radiology DG Foot Complete Right Result Date: 07/22/2024 CLINICAL DATA:  Fall with right foot pain. EXAM: RIGHT FOOT  COMPLETE - 3+ VIEW COMPARISON:  None Available. FINDINGS: Exam demonstrates minimally displaced fractures involving the head of metatarsals 3 and 5 and likely 4. Small inferior calcaneal spur. Remainder of the exam is unremarkable. IMPRESSION: Minimally displaced fractures involving the head of metatarsals 3 and 5 and likely 4. Electronically Signed   By: Toribio Agreste M.D.   On: 07/22/2024 15:13    Procedures Procedures (including critical care time)  Medications Ordered in UC Medications - No data to display  Initial Impression / Assessment and Plan / UC Course  I have reviewed the triage vital signs and the nursing notes.  Pertinent labs & imaging results that were available during my care of the patient were reviewed by me and considered in my medical decision making (see chart for details).  Clinical Course as of 07/22/24 1538  Thu Jul 22, 2024  1522 DG Foot Complete Right Minimally displaced fractures of 3rd, 4th, and 5th metatarsal heads.  [MQ]    Clinical Course User Index [MQ] Alba Sharper, MD    Suspect the patient has sprain of the 4th and 5th toe versus potential phalanx fracture of the digits or metatarsal injury.  Reassuringly she is neurovascularly intact, but has been ambulatory limited secondary to pain.  Will get complete views of the foot to evaluate for fracture.    Complete foot x-ray demonstrates minimally displaced fractures of the 3rd, 4th and 5th metatarsal heads.  Given difficulty ambulating due to pain, will place patient in short leg  boot.  Recommend close follow-up with orthopedics. May continue home pain regimen. Has rollator at home to use for safety while ambulating. Orthopedic follow-up information provided.  Final Clinical Impressions(s) / UC Diagnoses   Final diagnoses:  Pain in joint involving right ankle and foot  Closed fracture of head of metatarsal bone of right foot, initial encounter     Discharge Instructions      Your foot injury  yesterday resulted in 3 mild fractures of some of the bones in your foot. You were placed in a walking boot to keep the bones immobilized while they heal. You will need to follow-up with an orthopedic provider.  I have listed the phone number and address for both [facility] and the [facility].  Either 1 of these providers is fine. You will most likely be in the walking boot for 4 to 6 weeks. Please use your rollator at home as needed for safety while you are walking. You may continue your home pain regimen for pain.     ED Prescriptions   None    I have reviewed the PDMP during this encounter.    [1]  Social History Tobacco Use   Smoking status: Some Days    Current packs/day: 1.00    Average packs/day: 1 pack/day for 31.1 years (31.1 ttl pk-yrs)    Types: Cigarettes    Start date: 1995   Smokeless tobacco: Never   Tobacco comments:    Pt. smokes 6 a day, trying to quit  Vaping Use   Vaping status: Never Used  Substance Use Topics   Alcohol  use: No   Drug use: Yes    Types: Cocaine     Comment: former cocaine  addiction     Alba Sharper, MD 07/22/24 1607  "

## 2024-07-28 ENCOUNTER — Ambulatory Visit: Admitting: Podiatry

## 2024-07-28 ENCOUNTER — Encounter (INDEPENDENT_AMBULATORY_CARE_PROVIDER_SITE_OTHER): Admitting: Ophthalmology

## 2024-07-28 ENCOUNTER — Ambulatory Visit (INDEPENDENT_AMBULATORY_CARE_PROVIDER_SITE_OTHER)

## 2024-07-28 ENCOUNTER — Encounter: Payer: Self-pay | Admitting: Podiatry

## 2024-07-28 DIAGNOSIS — M79675 Pain in left toe(s): Secondary | ICD-10-CM

## 2024-07-28 DIAGNOSIS — S92301D Fracture of unspecified metatarsal bone(s), right foot, subsequent encounter for fracture with routine healing: Secondary | ICD-10-CM

## 2024-07-28 DIAGNOSIS — M79674 Pain in right toe(s): Secondary | ICD-10-CM

## 2024-07-28 DIAGNOSIS — B351 Tinea unguium: Secondary | ICD-10-CM

## 2024-07-28 NOTE — Progress Notes (Signed)
 Subjective:   Patient ID: [name], female   DOB: 64 y.o.   MRN: [id number]   HPI Patient states that she fractured her fifth metatarsal head right she is wearing a big boot which is uncomfortable and she has severe nail disease 1-5 both feet that she cannot take care of   ROS      Objective:  Physical Exam  Neurovascular status found to be intact thickened dystrophic nailbeds 1-5 both feet that are painful and edema and pain around the fifth metatarsal right history of fracture seen at urgent care     Assessment:  Probability for fracture of the fifth metatarsal head right wearing big boot which is very uncomfortable with mycotic nail infection 1-5 both the painful     Plan:  H&P reviewed both conditions.  For the fracture we will continue immobilization but I did dispense surgical shoe that she can start wearing with all education on ice compression immobilization and elevation.  Debrided nailbeds 1-5 both feet no iatrogenic bleeding reappoint routine care  X-rays indicate fracture of the neck of the fifth metatarsal right localized and does not appear to be out of position

## 2024-07-30 ENCOUNTER — Encounter: Payer: Self-pay | Admitting: *Deleted

## 2024-07-30 NOTE — Progress Notes (Signed)
 [name]                                          MRN: [id number]   07/30/2024   The [facility] Quality Team Specialist reviewed this patient medical record for the purposes of chart review for care gap closure. The following were reviewed: chart review for care gap closure-glycemic status assessment.    [facility] Quality Team

## 2024-08-09 ENCOUNTER — Encounter (INDEPENDENT_AMBULATORY_CARE_PROVIDER_SITE_OTHER): Admitting: Ophthalmology

## 2024-08-31 ENCOUNTER — Ambulatory Visit: Admitting: Physician Assistant

## 2024-09-01 ENCOUNTER — Telehealth (HOSPITAL_COMMUNITY): Admitting: Psychiatry
